# Patient Record
Sex: Female | Born: 1955 | Race: White | Hispanic: No | Marital: Married | Smoking: Former smoker
Health system: Southern US, Community
[De-identification: ages and names within clinical notes are randomized; demographics above are authoritative.]

## PROBLEM LIST (undated history)

## (undated) DIAGNOSIS — T7840XA Allergy, unspecified, initial encounter: Secondary | ICD-10-CM

## (undated) DIAGNOSIS — I639 Cerebral infarction, unspecified: Secondary | ICD-10-CM

## (undated) DIAGNOSIS — R0609 Other forms of dyspnea: Secondary | ICD-10-CM

## (undated) DIAGNOSIS — R0789 Other chest pain: Secondary | ICD-10-CM

## (undated) DIAGNOSIS — E039 Hypothyroidism, unspecified: Secondary | ICD-10-CM

## (undated) DIAGNOSIS — I219 Acute myocardial infarction, unspecified: Secondary | ICD-10-CM

## (undated) DIAGNOSIS — F32A Depression, unspecified: Secondary | ICD-10-CM

## (undated) DIAGNOSIS — Z803 Family history of malignant neoplasm of breast: Secondary | ICD-10-CM

## (undated) DIAGNOSIS — M51369 Other intervertebral disc degeneration, lumbar region without mention of lumbar back pain or lower extremity pain: Secondary | ICD-10-CM

## (undated) DIAGNOSIS — R519 Headache, unspecified: Secondary | ICD-10-CM

## (undated) DIAGNOSIS — Z1211 Encounter for screening for malignant neoplasm of colon: Secondary | ICD-10-CM

## (undated) DIAGNOSIS — F439 Reaction to severe stress, unspecified: Secondary | ICD-10-CM

## (undated) DIAGNOSIS — Z860100 Personal history of colon polyps, unspecified: Secondary | ICD-10-CM

## (undated) DIAGNOSIS — I2510 Atherosclerotic heart disease of native coronary artery without angina pectoris: Secondary | ICD-10-CM

## (undated) DIAGNOSIS — M1990 Unspecified osteoarthritis, unspecified site: Secondary | ICD-10-CM

## (undated) DIAGNOSIS — S2239XA Fracture of one rib, unspecified side, initial encounter for closed fracture: Secondary | ICD-10-CM

## (undated) DIAGNOSIS — F4310 Post-traumatic stress disorder, unspecified: Secondary | ICD-10-CM

## (undated) DIAGNOSIS — Z87891 Personal history of nicotine dependence: Secondary | ICD-10-CM

## (undated) DIAGNOSIS — I10 Essential (primary) hypertension: Secondary | ICD-10-CM

## (undated) DIAGNOSIS — R0600 Dyspnea, unspecified: Secondary | ICD-10-CM

## (undated) DIAGNOSIS — R7303 Prediabetes: Secondary | ICD-10-CM

## (undated) DIAGNOSIS — M5136 Other intervertebral disc degeneration, lumbar region: Secondary | ICD-10-CM

## (undated) DIAGNOSIS — E669 Obesity, unspecified: Secondary | ICD-10-CM

## (undated) DIAGNOSIS — O032 Embolism following incomplete spontaneous abortion: Secondary | ICD-10-CM

## (undated) DIAGNOSIS — R609 Edema, unspecified: Secondary | ICD-10-CM

## (undated) DIAGNOSIS — K219 Gastro-esophageal reflux disease without esophagitis: Secondary | ICD-10-CM

## (undated) DIAGNOSIS — G459 Transient cerebral ischemic attack, unspecified: Secondary | ICD-10-CM

## (undated) DIAGNOSIS — E785 Hyperlipidemia, unspecified: Secondary | ICD-10-CM

## (undated) HISTORY — DX: Fracture of one rib, unspecified side, initial encounter for closed fracture: S22.39XA

## (undated) HISTORY — DX: Encounter for screening for malignant neoplasm of colon: Z12.11

## (undated) HISTORY — DX: Hyperlipidemia, unspecified: E78.5

## (undated) HISTORY — DX: Allergy, unspecified, initial encounter: T78.40XA

## (undated) HISTORY — DX: Unspecified osteoarthritis, unspecified site: M19.90

## (undated) HISTORY — DX: Prediabetes: R73.03

## (undated) HISTORY — DX: Acute myocardial infarction, unspecified: I21.9

## (undated) HISTORY — DX: Personal history of colonic polyps: Z86.010

## (undated) HISTORY — DX: Personal history of nicotine dependence: Z87.891

## (undated) HISTORY — DX: Transient cerebral ischemic attack, unspecified: G45.9

## (undated) HISTORY — DX: Family history of malignant neoplasm of breast: Z80.3

## (undated) HISTORY — DX: Obesity, unspecified: E66.9

## (undated) HISTORY — DX: Personal history of colon polyps, unspecified: Z86.0100

## (undated) HISTORY — PX: MOUTH SURGERY: SHX715

## (undated) HISTORY — PX: COLONOSCOPY: SHX174

## (undated) HISTORY — DX: Atherosclerotic heart disease of native coronary artery without angina pectoris: I25.10

---

## 2005-12-02 ENCOUNTER — Ambulatory Visit: Payer: Self-pay | Admitting: Nurse Practitioner

## 2006-08-23 ENCOUNTER — Ambulatory Visit: Payer: Self-pay | Admitting: Nurse Practitioner

## 2006-09-22 ENCOUNTER — Ambulatory Visit: Payer: Self-pay | Admitting: Nurse Practitioner

## 2006-10-04 ENCOUNTER — Ambulatory Visit: Payer: Self-pay | Admitting: Nurse Practitioner

## 2007-12-10 ENCOUNTER — Ambulatory Visit: Payer: Self-pay | Admitting: Gastroenterology

## 2007-12-13 HISTORY — PX: BREAST MASS EXCISION: SHX1267

## 2007-12-13 HISTORY — PX: BREAST BIOPSY: SHX20

## 2007-12-27 ENCOUNTER — Ambulatory Visit: Payer: Self-pay | Admitting: Family Medicine

## 2008-01-02 ENCOUNTER — Ambulatory Visit: Payer: Self-pay | Admitting: Family Medicine

## 2008-01-25 ENCOUNTER — Ambulatory Visit: Payer: Self-pay | Admitting: General Surgery

## 2008-01-28 ENCOUNTER — Ambulatory Visit: Payer: Self-pay | Admitting: General Surgery

## 2008-04-11 ENCOUNTER — Encounter: Payer: Self-pay | Admitting: Cardiovascular Disease

## 2008-04-18 ENCOUNTER — Ambulatory Visit: Payer: Self-pay | Admitting: Cardiovascular Disease

## 2008-04-18 ENCOUNTER — Encounter: Payer: Self-pay | Admitting: Cardiovascular Disease

## 2008-11-04 ENCOUNTER — Encounter: Payer: Self-pay | Admitting: Cardiovascular Disease

## 2008-12-12 HISTORY — PX: CARDIAC CATHETERIZATION: SHX172

## 2008-12-15 ENCOUNTER — Ambulatory Visit: Payer: Self-pay | Admitting: General Surgery

## 2009-08-28 ENCOUNTER — Encounter: Payer: Self-pay | Admitting: Cardiovascular Disease

## 2009-12-17 ENCOUNTER — Ambulatory Visit: Payer: Self-pay | Admitting: General Surgery

## 2010-03-08 ENCOUNTER — Encounter: Payer: Self-pay | Admitting: Cardiovascular Disease

## 2010-04-16 ENCOUNTER — Encounter: Payer: Self-pay | Admitting: Cardiovascular Disease

## 2010-10-26 ENCOUNTER — Telehealth: Payer: Self-pay | Admitting: Cardiovascular Disease

## 2010-11-09 ENCOUNTER — Telehealth: Payer: Self-pay | Admitting: Cardiovascular Disease

## 2010-11-10 ENCOUNTER — Ambulatory Visit: Payer: Self-pay | Admitting: Cardiovascular Disease

## 2010-11-10 DIAGNOSIS — F4323 Adjustment disorder with mixed anxiety and depressed mood: Secondary | ICD-10-CM

## 2010-11-10 DIAGNOSIS — E785 Hyperlipidemia, unspecified: Secondary | ICD-10-CM

## 2010-12-12 HISTORY — PX: DILATION AND CURETTAGE OF UTERUS: SHX78

## 2010-12-12 HISTORY — PX: UTERINE FIBROID SURGERY: SHX826

## 2010-12-20 ENCOUNTER — Ambulatory Visit: Payer: Self-pay | Admitting: General Surgery

## 2011-01-10 ENCOUNTER — Encounter
Admission: RE | Admit: 2011-01-10 | Discharge: 2011-01-10 | Payer: Self-pay | Source: Home / Self Care | Attending: General Surgery | Admitting: General Surgery

## 2011-01-11 NOTE — Progress Notes (Signed)
  Prescriptions: BYSTOLIC 10 MG TABS (NEBIVOLOL HCL) 1 tab once daily  #30 x 6   Entered by:   Bishop Dublin, CMA   Authorized by:   Dossie Arbour MD   Signed by:   Bishop Dublin, CMA on 10/26/2010   Method used:   Electron[facility] to        [facility] [phone][facility] (retail)       [address]       [place], [place]  [address]       Ph: [phone]       Fax: [phone]   RxID:   [id number] HYDROCHLOROTHIAZIDE 12.5 MG TABS (HYDROCHLOROTHIAZIDE) Take one tablet by mouth daily.  #30 x 6   Entered by:   Bishop Dublin, CMA   Authorized by:   Dossie Arbour MD   Signed by:   Bishop Dublin, CMA on 10/26/2010   Method used:   Electronically to        [facility] [phone]* (retail)       [address]       [place], [place]  [address]       Ph: [phone]       Fax: [phone]   RxID:   [id number]

## 2011-01-11 NOTE — Letter (Signed)
Summary: Medical Record Release  Medical Record Release   Imported By: Harlon Flor 04/21/2010 15:50:16  _____________________________________________________________________  External Attachment:    Type:   Image     Comment:   External Document

## 2011-01-11 NOTE — Assessment & Plan Note (Signed)
Summary: CAD; Former patient [facility]   Visit Type:  Initial Consult Primary Provider:  [facility]  CC:  c/o chest tightness and has shortness of breath..  History of Present Illness: [name] is a 55 yo woman with PMH of morbid obesity, hypertension, hyperlipidemia, history of chest pain with cardiac catheterization May 2009  showing no significant coronary artery disease though she does have a circumflex coming off the ostium of the RCA, known to me from [facility],  who presents to establish care.  She has significant stress at home with her son who has autism and her husband who does not help her look after her son. She does no exercise, her weight has been climbing. During our evaluation, she apeared calm though reported that at times she wanted to "end it all". She wonders if her stress is normal and if she was a bad parent for wanting to escape from her home situation. She takes paxil though reports waking in the middle of the night and is unable to get back to sleep, with periods of severe anxiety.  No significant chest pain. Chronic SOB with exertion, some chest fluttering with walking.  Stress test 04/2008:   EKG shows normal sinus rhythm with rate 73 beats per minute and no significant ST or T wave changes  Preventive Screening-Counseling & Management  Alcohol-Tobacco     Smoking Status: never  Caffeine-Diet-Exercise     Does Patient Exercise: no  Current Medications (verified): 1)  Hydrochlorothiazide 12.5 Mg Tabs (Hydrochlorothiazide) .... Take One Tablet By Mouth Daily. 2)  Aspirin 325 Mg Tabs (Aspirin) .Marland Kitchen.. 1 Tab Once Daily 3)  Verapamil Hcl Cr 240 Mg Cr-Tabs (Verapamil Hcl) .Marland Kitchen.. 1 Tab Once Daily 4)  Paxil 30 Mg Tabs (Paroxetine Hcl) .... 2 Tabs Once Daily 5)  Promethazine Hcl 25 Mg Tabs (Promethazine Hcl) .Marland Kitchen.. 1 Tab Once Daily 6)  Bystolic 10 Mg Tabs (Nebivolol Hcl) .Marland Kitchen.. 1 Tab Once Daily 7)  Simvastatin 20 Mg Tabs (Simvastatin) .Marland Kitchen.. 1 Tab Once Daily 8)  Zyrtec  Allergy 10 Mg Tabs (Cetirizine Hcl) .... As Needed 9)  Omeprazole 20 Mg Cpdr (Omeprazole) .... One Tablet Once Daily 10)  Flovent Diskus 100 Mcg/blist Aepb (Fluticasone Propionate (Inhal)) .... As Needed 11)  Proair Hfa 108 (90 Base) Mcg/act Aers (Albuterol Sulfate) .... As Needed 12)  Centrum Silver  Tabs (Multiple Vitamins-Minerals) .... One Tablet Once Daily  Allergies (verified): 1)  ! Sulfa 2)  ! Benadryl  Past History:  Past Medical History: Last updated: 06/24/2010 Hyperlipidemia Hypertension Asthma Obesity  Family History: Last updated: 11/29/10 Father: Deceased age 55 cancer Mother:Deceased age 56 cancer   Social History: Last updated: 2010-11-29 Married  Tobacco Use - No.  Alcohol Use - no Regular Exercise - no  Risk Factors: Exercise: no (2010-11-29)  Risk Factors: Smoking Status: never (29-Nov-2010)  Past Surgical History: oral surgery  Family History: Father: Deceased age 57 cancer Mother:Deceased age 49 cancer   Social History: Married  Tobacco Use - No.  Alcohol Use - no Regular Exercise - no Smoking Status:  never Does Patient Exercise:  no  Review of Systems       The patient complains of dyspnea on exertion.  The patient denies fever, weight loss, weight gain, vision loss, decreased hearing, hoarseness, chest pain, syncope, peripheral edema, prolonged cough, abdominal pain, incontinence, muscle weakness, depression, and enlarged lymph nodes.         anxiety, stress  Vital Signs:  Patient profile:   55  year old female Height:      62 inches Weight:      250.50 pounds BMI:     45.98 Pulse rate:   73 / minute BP sitting:   132 / 84  (left arm) Cuff size:   large  Vitals Entered By: Bishop Dublin, CMA (November 10, 2010 3:53 PM)  Physical Exam  General:  Well developed, well nourished, in no acute distress.Obese Head:  normocephalic and atraumatic Neck:  Neck supple, no JVD. No masses, thyromegaly or abnormal cervical  nodes. Lungs:  Clear bilaterally to auscultation and percussion. Heart:  Non-displaced PMI, chest non-tender; regular rate and rhythm, S1, S2 without murmurs, rubs or gallops. Carotid upstroke normal, no bruit. Pedals normal pulses. No edema, no varicosities. Abdomen:  Bowel sounds positive; abdomen soft and non-tender without masses, morbidly obese Msk:  Back normal, normal gait. Muscle strength and tone normal. Pulses:  pulses normal in all 4 extremities Extremities:  No clubbing or cyanosis. Neurologic:  Alert and oriented x 3. Skin:  Intact without lesions or rashes. Psych:  Normal affect.   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 I have suggested that she talk with counselor and sign up with a new PMD if she does not have one. One possible option would be xanax as needed for waking in the middle of the night with severe anxiety.   Problem # 2:  HYPERTENSION, BENIGN (ICD-401.1) Blood pressure is well controlled on todays visit.  Her updated medication list for this problem includes:    Hydrochlorothiazide 12.5 Mg Tabs (Hydrochlorothiazide) .Marland Kitchen... Take one tablet by mouth daily.    Aspirin 325 Mg Tabs (Aspirin) .Marland Kitchen... 1 tab once daily    Verapamil Hcl Cr 240 Mg Cr-tabs (Verapamil hcl) .Marland Kitchen... 1 tab once daily    Bystolic 10 Mg Tabs (Nebivolol hcl) .Marland Kitchen... 1 tab once daily  Problem # 3:  HYPERLIPIDEMIA-MIXED (ICD-272.4) We will check her lipids/LFTs this week when she has been fasting.  Her updated medication list for this problem includes:    Simvastatin 20 Mg Tabs (Simvastatin) .Marland Kitchen... 1 tab once daily  Problem # 4:  OVERWEIGHT/OBESITY (ICD-278.02) We have suggested she attend weight watchers. Her husband cooks and he woulc need to be part of her weight loss plan.  Patient Instructions: 1)  Your  physician recommends that you schedule a follow-up appointment in: 6 months 2)  Your physician recommends that you return for a FASTING lipid profile: Tomorrow (Lipid/LFT) 3)  Your physician recommends that you continue on your current medications as directed. Please refer to the Current Medication list given to you today.

## 2011-01-11 NOTE — Progress Notes (Signed)
Summary: RX  Phone Note Refill Request Call back at Home Phone [phone] Message from:  Patient on November 09, 2010 2:06 PM  Refills Requested: Medication #1:  VERAPAMIL HCL CR 240 MG CR-TABS 1 tab once [facility] [place]Please call pt once this has been called in  Initial call taken by: Harlon Flor,  November 09, 2010 2:07 PM  Follow-up for Phone Call        Rx called to pharmacy Follow-up by: Bishop Dublin, CMA,  November 09, 2010 3:05 PM    Prescriptions: VERAPAMIL HCL CR 240 MG CR-TABS (VERAPAMIL HCL) 1 tab once daily  #30 x 3   Entered by:   Bishop Dublin, CMA   Authorized by:   Dossie Arbour MD   Signed by:   Bishop Dublin, CMA on 11/09/2010   Method used:   Electronically to        [facility] [phone][facility] (retail)       [address]       [place], [place]  [address]       Ph: [phone]       Fax: [phone]   RxID:   [id number]

## 2011-01-11 NOTE — Miscellaneous (Signed)
Summary: rx: HCTZ  Clinical Lists Changes  Medications: Added new medication of HYDROCHLOROTHIAZIDE 12.5 MG TABS (HYDROCHLOROTHIAZIDE) Take one tablet by mouth daily.    [facility]  ok x6  03/05/10 [place] :)

## 2011-01-13 NOTE — Letter (Signed)
Summary: [facility] Office Note   [facility] Office Note   Imported By: Roderic Ovens 12/16/2010 15:46:57  _____________________________________________________________________  External Attachment:    Type:   Image     Comment:   External Document

## 2011-01-13 NOTE — Cardiovascular Report (Signed)
Summary: [facility]   [facility]   Imported By: Roderic Ovens 01/07/2011 13:16:32  _____________________________________________________________________  External Attachment:    Type:   Image     Comment:   External Document

## 2011-01-13 NOTE — Letter (Signed)
Summary: [facility] Office Note   [facility] Office Note   Imported By: Roderic Ovens 12/16/2010 15:47:20  _____________________________________________________________________  External Attachment:    Type:   Image     Comment:   External Document

## 2011-01-24 ENCOUNTER — Ambulatory Visit: Payer: Self-pay

## 2011-01-24 DIAGNOSIS — I10 Essential (primary) hypertension: Secondary | ICD-10-CM

## 2011-01-31 ENCOUNTER — Telehealth: Payer: Self-pay | Admitting: Cardiovascular Disease

## 2011-02-03 ENCOUNTER — Ambulatory Visit: Payer: Self-pay

## 2011-02-08 NOTE — Progress Notes (Signed)
Summary: FYI  Phone Note Call from Patient Call back at [facility] Phone [phone]   Caller: Self Call For: Chasady Longwell Summary of Call: Pt is having a DNC histoscopy on Thursday.  Pt would like a return call from [place]. Initial call taken by: Harlon Flor,  January 31, 2011 8:25 AM  Follow-up for Phone Call        Pt is having D&C at OB-GYN this Thursday at Dr. Merrilee Seashore office. Pt thought she needed clearance, but was mistaken. Pt also just confirming she could stop ASA. Pt has not had any stent placed, cath in 2009. Told pt she could stop ASA and will notify Dr. Mariah Milling of this procedure, but that she should be fine to have procedure. Follow-up by: Lanny Hurst RN,  February 01, 2011 12:28 PM

## 2011-03-07 ENCOUNTER — Telehealth: Payer: Self-pay

## 2011-03-07 MED ORDER — VERAPAMIL HCL ER 240 MG PO TBCR: 240.0000 mg | EXTENDED_RELEASE_TABLET | Freq: Every day | ORAL | Status: DC

## 2011-03-07 NOTE — Telephone Encounter (Signed)
Needs a refill on verapamil ER 240 mg take one tablet every day.

## 2011-05-19 ENCOUNTER — Other Ambulatory Visit: Payer: Self-pay

## 2011-05-19 MED ORDER — SIMVASTATIN 20 MG PO TABS: 20.0000 mg | ORAL_TABLET | Freq: Every evening | ORAL | Status: DC

## 2011-05-19 NOTE — Telephone Encounter (Signed)
Needs a refill for simvastatin 20 mg sent to [facility] pharmacy.

## 2011-05-24 ENCOUNTER — Other Ambulatory Visit: Payer: Self-pay | Admitting: Emergency Medicine

## 2011-06-06 ENCOUNTER — Other Ambulatory Visit: Payer: Self-pay | Admitting: Emergency Medicine

## 2011-06-06 MED ORDER — HYDROCHLOROTHIAZIDE 12.5 MG PO CAPS: 12.5000 mg | ORAL_CAPSULE | Freq: Every day | ORAL | Status: DC

## 2011-06-10 ENCOUNTER — Telehealth: Payer: Self-pay

## 2011-06-10 MED ORDER — SIMVASTATIN 20 MG PO TABS: 20.0000 mg | ORAL_TABLET | Freq: Every evening | ORAL | Status: DC

## 2011-06-10 NOTE — Telephone Encounter (Signed)
Needs a refill sent for simvastatin 20 mg take one tablet at bedtime [facility]

## 2011-06-16 ENCOUNTER — Other Ambulatory Visit: Payer: Self-pay | Admitting: *Deleted

## 2011-06-16 MED ORDER — NEBIVOLOL HCL 10 MG PO TABS: 10.0000 mg | ORAL_TABLET | Freq: Every day | ORAL | Status: DC

## 2011-06-16 NOTE — Telephone Encounter (Signed)
rx sent in rx today bystolic

## 2011-07-05 ENCOUNTER — Encounter: Payer: Self-pay | Admitting: Cardiovascular Disease

## 2011-07-25 ENCOUNTER — Telehealth: Payer: Self-pay

## 2011-07-25 MED ORDER — HYDROCHLOROTHIAZIDE 12.5 MG PO CAPS: 12.5000 mg | ORAL_CAPSULE | Freq: Every day | ORAL | Status: DC

## 2011-07-25 MED ORDER — NEBIVOLOL HCL 10 MG PO TABS: 10.0000 mg | ORAL_TABLET | Freq: Every day | ORAL | Status: DC

## 2011-07-25 MED ORDER — VERAPAMIL HCL ER 240 MG PO TBCR: 240.0000 mg | EXTENDED_RELEASE_TABLET | Freq: Every day | ORAL | Status: DC

## 2011-07-25 MED ORDER — SIMVASTATIN 20 MG PO TABS: 20.0000 mg | ORAL_TABLET | Freq: Every evening | ORAL | Status: DC

## 2011-07-25 NOTE — Telephone Encounter (Signed)
Requested refills for simvastatin, HCTZ, Bystolic and verapamil.

## 2011-10-14 ENCOUNTER — Telehealth: Payer: Self-pay

## 2011-10-14 MED ORDER — NEBIVOLOL HCL 10 MG PO TABS: 10.0000 mg | ORAL_TABLET | Freq: Every day | ORAL | Status: DC

## 2011-10-14 NOTE — Telephone Encounter (Signed)
Refill sent for bystolic 10 mg take one tablet daily.

## 2011-11-11 ENCOUNTER — Telehealth: Payer: Self-pay | Admitting: Cardiovascular Disease

## 2011-11-11 DIAGNOSIS — R0602 Shortness of breath: Secondary | ICD-10-CM

## 2011-11-11 DIAGNOSIS — R609 Edema, unspecified: Secondary | ICD-10-CM

## 2011-11-11 NOTE — Telephone Encounter (Signed)
Pt c/o "not feeling normal;" over last month incr in fatigue, heaviness in chest, swelling (all over, measures by wedding band tighter). DOE, but this is normal for pt. She does take hctz 12.5mg  daily, she has recently incr to 25mg  daily on her own, with noticed incr in UOP. BP today is 120/66 HR 72.  Pt last ov 10/2010, scheduled next thur 12/7 for 1 yr f/u. H/O morbid obesity, CP with repeat cath '09 showing now significant CAD, incr stress at home per last note, chronic DOE.   I told pt to come in day before appt  for Bmet and BNP, and to call back if sx worsen. Do you want pt to try higher dose of hctz in meantime? Please advise.

## 2011-11-11 NOTE — Telephone Encounter (Signed)
Pt having increased swelling and chest tightness. Wants to talk to a nurse.

## 2011-11-13 NOTE — Telephone Encounter (Signed)
i suspect secondary to weight gain. Decrease po salt, fluids. Stay on hctz 25 daily

## 2011-11-15 ENCOUNTER — Encounter: Payer: Self-pay | Admitting: Cardiovascular Disease

## 2011-11-15 NOTE — Telephone Encounter (Signed)
Pt notified, she will f/u as scheduled.

## 2011-11-17 ENCOUNTER — Ambulatory Visit (INDEPENDENT_AMBULATORY_CARE_PROVIDER_SITE_OTHER): Payer: Commercial Indemnity | Admitting: *Deleted

## 2011-11-17 DIAGNOSIS — R609 Edema, unspecified: Secondary | ICD-10-CM

## 2011-11-17 DIAGNOSIS — R0602 Shortness of breath: Secondary | ICD-10-CM

## 2011-11-18 ENCOUNTER — Ambulatory Visit (INDEPENDENT_AMBULATORY_CARE_PROVIDER_SITE_OTHER): Payer: Commercial Indemnity | Admitting: Cardiovascular Disease

## 2011-11-18 ENCOUNTER — Telehealth: Payer: Self-pay

## 2011-11-18 ENCOUNTER — Encounter: Payer: Self-pay | Admitting: Cardiovascular Disease

## 2011-11-18 DIAGNOSIS — I10 Essential (primary) hypertension: Secondary | ICD-10-CM

## 2011-11-18 DIAGNOSIS — F4323 Adjustment disorder with mixed anxiety and depressed mood: Secondary | ICD-10-CM

## 2011-11-18 DIAGNOSIS — R609 Edema, unspecified: Secondary | ICD-10-CM

## 2011-11-18 DIAGNOSIS — E785 Hyperlipidemia, unspecified: Secondary | ICD-10-CM

## 2011-11-18 DIAGNOSIS — E663 Overweight: Secondary | ICD-10-CM

## 2011-11-18 DIAGNOSIS — R079 Chest pain, unspecified: Secondary | ICD-10-CM

## 2011-11-18 DIAGNOSIS — R0789 Other chest pain: Secondary | ICD-10-CM | POA: Insufficient documentation

## 2011-11-18 MED ORDER — SIMVASTATIN 20 MG PO TABS: 20.0000 mg | ORAL_TABLET | Freq: Every evening | ORAL | Status: DC

## 2011-11-18 MED ORDER — POTASSIUM CHLORIDE ER 10 MEQ PO TBCR: 10.0000 meq | EXTENDED_RELEASE_TABLET | Freq: Two times a day (BID) | ORAL | Status: DC

## 2011-11-18 MED ORDER — HYDROCHLOROTHIAZIDE 25 MG PO TABS: 25.0000 mg | ORAL_TABLET | Freq: Every day | ORAL | Status: DC

## 2011-11-18 MED ORDER — VERAPAMIL HCL ER 240 MG PO TBCR: 240.0000 mg | EXTENDED_RELEASE_TABLET | Freq: Every day | ORAL | Status: DC

## 2011-11-18 MED ORDER — FUROSEMIDE 20 MG PO TABS: 20.0000 mg | ORAL_TABLET | Freq: Every day | ORAL | Status: DC | PRN

## 2011-11-18 MED ORDER — NEBIVOLOL HCL 10 MG PO TABS: 10.0000 mg | ORAL_TABLET | Freq: Every day | ORAL | Status: DC

## 2011-11-18 NOTE — Assessment & Plan Note (Signed)
Cholesterol is at goal on the current lipid regimen. No changes to the medications were made.  

## 2011-11-18 NOTE — Assessment & Plan Note (Signed)
Blood pressure is well controlled on today's visit. No changes made to the medications. 

## 2011-11-18 NOTE — Assessment & Plan Note (Signed)
Chest pain is likely noncardiac given clean coronary arteries by catheterization 3 years ago. She is reluctant to take nitroglycerin secondary to history of severe migraines

## 2011-11-18 NOTE — Patient Instructions (Addendum)
You are doing well. We will start lasix as needed for edema or swelling or chest tightness, with potassium.  We have ordered an echocardiogram to look for reasons for edema, murmur and chest pain  Please call us if you have new issues that need to be addressed before your next appt.  The office will contact you for a follow up Appt. In 6 months

## 2011-11-18 NOTE — Telephone Encounter (Signed)
Refill sent for calan, simvastatin, bystolic, hydrochlorothiazide.

## 2011-11-18 NOTE — Telephone Encounter (Signed)
Refill sent for calan

## 2011-11-18 NOTE — Assessment & Plan Note (Signed)
I suspect that underlying anxiety could be playing a role in her symptoms

## 2011-11-18 NOTE — Progress Notes (Signed)
Patient ID: Elizabeth Wyatt, female    DOB: [birth year], 55 y.o.   MRN: [id number]  HPI Comments: Elizabeth Wyatt is a 55 yo woman with PMH of morbid obesity, hypertension, hyperlipidemia, history of chest pain with cardiac catheterization May 2009  showing no significant coronary artery disease though she does have a circumflex coming off the ostium of the RCA, Who presents for routine followup.  She has numerous complaints and reports having edema, abdominal swelling, swollen fingers and fluid retention. She has had recent episodes of chest pain over the past week while at rest. She describes this as a squeezing in her chest, also as a pressure. She is under the understanding that she has had a heart attack in the past and we have tried to correct her that she has no underlying coronary artery disease by catheterization 3 years ago.  She does eat out a lot, her weight continues to get worse.   She has significant stress at home with her son who has autism. She does no exercise.  periods of severe anxiety.   Stress test 04/2008:  Cardiac catheter at the same time   EKG shows normal sinus rhythm with rate 63 beats per minute and no significant ST or T wave changes    Outpatient Encounter Prescriptions as of 11/18/2011  Medication Sig Dispense Refill  . albuterol (PROAIR HFA) 108 (90 BASE) MCG/ACT inhaler Inhale 2 puffs into the lungs every 6 (six) hours as needed.        Marland Kitchen aspirin 325 MG tablet Take 325 mg by mouth daily.        . cetirizine (ZYRTEC) 10 MG tablet Take 10 mg by mouth as needed.        . DULoxetine (CYMBALTA) 30 MG capsule Take 30 mg by mouth daily.        . Fluticasone Propionate, Inhal, (FLOVENT DISKUS) 100 MCG/BLIST AEPB Inhale into the lungs as needed.        Marland Kitchen omeprazole (PRILOSEC) 20 MG capsule Take 20 mg by mouth daily.        Marland Kitchen topiramate (TOPAMAX) 25 MG capsule Take 25 mg by mouth daily.        .  hydrochlorothiazide (MICROZIDE) 12.5 MG capsule Take two tablets  every am       .  nebivolol (BYSTOLIC) 10 MG tablet Take 1 tablet (10 mg total) by mouth daily.  30 tablet  3  .  simvastatin (ZOCOR) 20 MG tablet Take 1 tablet (20 mg total) by mouth every evening.  30 tablet  6  .  verapamil (CALAN-SR) 240 MG CR tablet Take 1 tablet (240 mg total) by mouth at bedtime.  30 tablet  6     Review of Systems  Constitutional: Negative.   HENT: Negative.   Eyes: Negative.   Respiratory: Negative.   Cardiovascular: Positive for chest pain and leg swelling.  Gastrointestinal: Positive for abdominal distention.  Musculoskeletal: Negative.   Skin: Negative.   Neurological: Negative.   Hematological: Negative.   Psychiatric/Behavioral: Negative.   All other systems reviewed and are negative.    BP 120/72  Pulse 63  Ht 5\' 3"  (1.6 m)  Wt 255 lb 8 oz (115.894 kg)  BMI 45.26 kg/m2  Physical Exam  Nursing note and vitals reviewed. Constitutional: She is oriented to person, place, and time. She appears well-developed and well-nourished.        obese  HENT:  Head: Normocephalic.  Nose: Nose normal.  Mouth/Throat: Oropharynx  is clear and moist.  Eyes: Conjunctivae are normal. Pupils are equal, round, and reactive to light.  Neck: Normal range of motion. Neck supple. No JVD present.  Cardiovascular: Normal rate, regular rhythm, S1 normal, S2 normal and intact distal pulses.  Exam reveals no gallop and no friction rub.   Murmur heard. Pulmonary/Chest: Effort normal and breath sounds normal. No respiratory distress. She has no wheezes. She has no rales. She exhibits no tenderness.  Abdominal: Soft. Bowel sounds are normal. She exhibits no distension. There is no tenderness.  Musculoskeletal: Normal range of motion. She exhibits edema. She exhibits no tenderness.  Lymphadenopathy:    She has no cervical adenopathy.  Neurological: She is alert and oriented to person, place, and time. Coordination normal.  Skin: Skin is warm and dry. No rash noted. No  erythema.  Psychiatric: She has a normal mood and affect. Her behavior is normal. Judgment and thought content normal.         Assessment and Plan

## 2011-11-18 NOTE — Assessment & Plan Note (Signed)
Etiology of her edema is likely secondary to severe obesity, possible stalk dysfunction, high salt intake, significant p.o. Fluid intake. We have asked her to cut back on her fluids and salt and will start her on Lasix 20 mg p.r.n. With potassium

## 2011-11-18 NOTE — Assessment & Plan Note (Signed)
She continues to struggle with her weight. We have encouraged continued exercise, careful diet management in an effort to lose weight.

## 2011-11-25 ENCOUNTER — Other Ambulatory Visit (INDEPENDENT_AMBULATORY_CARE_PROVIDER_SITE_OTHER): Payer: Commercial Indemnity | Admitting: *Deleted

## 2011-11-25 DIAGNOSIS — R079 Chest pain, unspecified: Secondary | ICD-10-CM

## 2011-11-25 DIAGNOSIS — R609 Edema, unspecified: Secondary | ICD-10-CM

## 2011-11-28 ENCOUNTER — Telehealth: Payer: Self-pay | Admitting: *Deleted

## 2011-11-28 NOTE — Telephone Encounter (Signed)
Pt notified echo results were normal per Dr. Mariah Milling. Non-cardiac cause of her chest pain, and if recurrent chest pain advised to f/u with pcp, otherwise f/u here PRN.

## 2011-12-01 LAB — BASIC METABOLIC PANEL
BUN/Creatinine Ratio: 15 (ref 9–23)
BUN: 13 mg/dL (ref 6–24)
CO2: 23 mmol/L (ref 20–32)
Creatinine, Ser: 0.87 mg/dL (ref 0.57–1.00)
GFR calc Af Amer: 87 mL/min/{1.73_m2} (ref >59)
GFR calc non Af Amer: 75 mL/min/{1.73_m2} (ref >59)

## 2011-12-28 ENCOUNTER — Ambulatory Visit: Payer: Self-pay | Admitting: General Surgery

## 2012-01-16 ENCOUNTER — Other Ambulatory Visit: Payer: Self-pay | Admitting: *Deleted

## 2012-01-16 MED ORDER — SIMVASTATIN 20 MG PO TABS: 20.0000 mg | ORAL_TABLET | Freq: Every evening | ORAL | Status: DC

## 2012-02-11 ENCOUNTER — Emergency Department: Payer: Self-pay | Admitting: Emergency Medicine

## 2012-03-18 ENCOUNTER — Other Ambulatory Visit: Payer: Self-pay | Admitting: Cardiovascular Disease

## 2012-03-26 ENCOUNTER — Other Ambulatory Visit: Payer: Self-pay | Admitting: *Deleted

## 2012-03-26 MED ORDER — HYDROCHLOROTHIAZIDE 25 MG PO TABS: 25.0000 mg | ORAL_TABLET | Freq: Every day | ORAL | Status: DC

## 2012-04-02 DIAGNOSIS — F419 Anxiety disorder, unspecified: Secondary | ICD-10-CM | POA: Insufficient documentation

## 2012-04-16 ENCOUNTER — Other Ambulatory Visit: Payer: Self-pay | Admitting: *Deleted

## 2012-04-16 MED ORDER — SIMVASTATIN 20 MG PO TABS: 20.0000 mg | ORAL_TABLET | Freq: Every evening | ORAL | Status: DC

## 2012-04-16 NOTE — Telephone Encounter (Signed)
Refilled Simvastatin. 

## 2012-06-09 ENCOUNTER — Emergency Department: Payer: Self-pay | Admitting: Emergency Medicine

## 2012-06-29 ENCOUNTER — Other Ambulatory Visit: Payer: Self-pay | Admitting: Cardiovascular Disease

## 2012-06-29 NOTE — Telephone Encounter (Signed)
LMTCB to set up appointment and refilled Verapamil until she is able to be seen.

## 2012-07-18 ENCOUNTER — Other Ambulatory Visit: Payer: Self-pay | Admitting: Cardiovascular Disease

## 2012-07-18 NOTE — Telephone Encounter (Signed)
Refilled Bystolic

## 2012-07-20 ENCOUNTER — Other Ambulatory Visit: Payer: Self-pay

## 2012-07-20 MED ORDER — SIMVASTATIN 20 MG PO TABS: 20.0000 mg | ORAL_TABLET | Freq: Every evening | ORAL | Status: DC

## 2012-07-20 NOTE — Telephone Encounter (Signed)
Refill sent for simvastatin 20 mg take one tablet daily at bedtime.

## 2012-07-30 ENCOUNTER — Ambulatory Visit (INDEPENDENT_AMBULATORY_CARE_PROVIDER_SITE_OTHER): Payer: Commercial Indemnity | Admitting: Cardiovascular Disease

## 2012-07-30 ENCOUNTER — Encounter: Payer: Self-pay | Admitting: Cardiovascular Disease

## 2012-07-30 ENCOUNTER — Ambulatory Visit: Payer: Commercial Indemnity | Admitting: Cardiovascular Disease

## 2012-07-30 VITALS — BP 112/72 | HR 62 | Ht 63.0 in | Wt 236.0 lb

## 2012-07-30 DIAGNOSIS — F4323 Adjustment disorder with mixed anxiety and depressed mood: Secondary | ICD-10-CM

## 2012-07-30 DIAGNOSIS — E663 Overweight: Secondary | ICD-10-CM

## 2012-07-30 DIAGNOSIS — I10 Essential (primary) hypertension: Secondary | ICD-10-CM

## 2012-07-30 DIAGNOSIS — R609 Edema, unspecified: Secondary | ICD-10-CM

## 2012-07-30 NOTE — Patient Instructions (Addendum)
You are doing well. No medication changes were made.  Please call us if you have new issues that need to be addressed before your next appt.  Your physician wants you to follow-up in: 12 months.  You will receive a reminder letter in the mail two months in advance. If you don't receive a letter, please call our office to schedule the follow-up appointment. 

## 2012-07-30 NOTE — Assessment & Plan Note (Signed)
Blood pressure is well controlled on today's visit. No changes made to the medications. 

## 2012-07-30 NOTE — Assessment & Plan Note (Signed)
Weight is down 20 pounds from her last clinic visit. She is watching her portions.

## 2012-07-30 NOTE — Assessment & Plan Note (Signed)
Her biggest issue appears to be her stress at home.

## 2012-07-30 NOTE — Assessment & Plan Note (Signed)
Edema has resolved. She is not taking a diuretic.
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
## 2012-10-30 ENCOUNTER — Other Ambulatory Visit: Payer: Self-pay | Admitting: Cardiovascular Disease

## 2012-10-30 NOTE — Telephone Encounter (Signed)
Refilled Verapamil.

## 2012-11-18 ENCOUNTER — Other Ambulatory Visit: Payer: Self-pay | Admitting: Cardiovascular Disease

## 2012-11-19 ENCOUNTER — Other Ambulatory Visit: Payer: Self-pay

## 2012-11-19 MED ORDER — NEBIVOLOL HCL 10 MG PO TABS: 10.0000 mg | ORAL_TABLET | Freq: Every day | ORAL | Status: DC

## 2012-11-19 NOTE — Telephone Encounter (Signed)
Refill sent for bystolic 

## 2013-01-14 ENCOUNTER — Ambulatory Visit: Payer: Self-pay | Admitting: General Surgery

## 2013-02-21 ENCOUNTER — Other Ambulatory Visit: Payer: Self-pay | Admitting: Cardiovascular Disease

## 2013-02-21 NOTE — Telephone Encounter (Signed)
Refilled Bystolic #30 Refill Refill#6 and Verapamil #30 [facility]

## 2013-03-01 ENCOUNTER — Other Ambulatory Visit: Payer: Self-pay | Admitting: General Surgery

## 2013-03-01 DIAGNOSIS — Z86010 Personal history of colonic polyps: Secondary | ICD-10-CM

## 2013-03-06 ENCOUNTER — Ambulatory Visit: Payer: Self-pay | Admitting: General Surgery

## 2013-03-06 DIAGNOSIS — K5730 Diverticulosis of large intestine without perforation or abscess without bleeding: Secondary | ICD-10-CM

## 2013-03-06 DIAGNOSIS — Z860100 Personal history of colon polyps, unspecified: Secondary | ICD-10-CM

## 2013-03-07 ENCOUNTER — Other Ambulatory Visit: Payer: Self-pay | Admitting: Cardiovascular Disease

## 2013-03-07 ENCOUNTER — Telehealth: Payer: Self-pay | Admitting: *Deleted

## 2013-03-07 NOTE — Telephone Encounter (Signed)
Pt aware.

## 2013-03-07 NOTE — Telephone Encounter (Signed)
Pt called this morning states she had a colonoscopy yesterday by Dr. Evette Cristal.  States she has had 5 loose-mushy BM bright red blood and a clot last night.  No BM for today yet.  I told her I would let Dr Evette Cristal know if he has any suggestions.

## 2013-03-07 NOTE — Telephone Encounter (Signed)
Monitor her symptoms today, call if it persists.

## 2013-03-11 ENCOUNTER — Encounter: Payer: Self-pay | Admitting: General Surgery

## 2013-03-20 ENCOUNTER — Other Ambulatory Visit: Payer: Self-pay | Admitting: Cardiovascular Disease

## 2013-03-20 NOTE — Telephone Encounter (Signed)
Refilled Simvastatin sent to [facility] pharmacy. 

## 2013-03-22 DIAGNOSIS — F32A Depression, unspecified: Secondary | ICD-10-CM | POA: Insufficient documentation

## 2013-03-22 DIAGNOSIS — F329 Major depressive disorder, single episode, unspecified: Secondary | ICD-10-CM | POA: Insufficient documentation

## 2013-04-11 ENCOUNTER — Other Ambulatory Visit: Payer: Self-pay | Admitting: Cardiovascular Disease

## 2013-04-11 NOTE — Telephone Encounter (Signed)
Pt is taking is taking Verapamil 240 mg and is on Simvastatin 20 mg pharmacy is questioning if ok to refill?

## 2013-04-22 DIAGNOSIS — J309 Allergic rhinitis, unspecified: Secondary | ICD-10-CM | POA: Insufficient documentation

## 2013-04-22 DIAGNOSIS — J45909 Unspecified asthma, uncomplicated: Secondary | ICD-10-CM | POA: Insufficient documentation

## 2013-04-27 ENCOUNTER — Emergency Department: Payer: Self-pay | Admitting: Emergency Medicine

## 2013-06-17 ENCOUNTER — Encounter: Payer: Self-pay | Admitting: *Deleted

## 2013-06-17 DIAGNOSIS — Z860100 Personal history of colon polyps, unspecified: Secondary | ICD-10-CM | POA: Insufficient documentation

## 2013-06-17 DIAGNOSIS — G459 Transient cerebral ischemic attack, unspecified: Secondary | ICD-10-CM | POA: Insufficient documentation

## 2013-08-11 ENCOUNTER — Emergency Department: Payer: Self-pay | Admitting: Emergency Medicine

## 2013-08-23 ENCOUNTER — Encounter: Payer: Self-pay | Admitting: Internal Medicine

## 2013-08-23 ENCOUNTER — Ambulatory Visit (INDEPENDENT_AMBULATORY_CARE_PROVIDER_SITE_OTHER): Payer: Commercial Indemnity | Admitting: Internal Medicine

## 2013-08-23 VITALS — BP 110/70 | HR 73 | Temp 98.2°F | Ht 61.0 in | Wt 241.5 lb

## 2013-08-23 DIAGNOSIS — E785 Hyperlipidemia, unspecified: Secondary | ICD-10-CM

## 2013-08-23 DIAGNOSIS — Z860100 Personal history of colon polyps, unspecified: Secondary | ICD-10-CM

## 2013-08-23 DIAGNOSIS — E663 Overweight: Secondary | ICD-10-CM

## 2013-08-23 DIAGNOSIS — Z23 Encounter for immunization: Secondary | ICD-10-CM

## 2013-08-23 DIAGNOSIS — R609 Edema, unspecified: Secondary | ICD-10-CM

## 2013-08-23 DIAGNOSIS — F4323 Adjustment disorder with mixed anxiety and depressed mood: Secondary | ICD-10-CM

## 2013-08-23 DIAGNOSIS — I10 Essential (primary) hypertension: Secondary | ICD-10-CM

## 2013-08-23 DIAGNOSIS — G459 Transient cerebral ischemic attack, unspecified: Secondary | ICD-10-CM

## 2013-08-26 ENCOUNTER — Encounter: Payer: Self-pay | Admitting: Internal Medicine

## 2013-08-26 NOTE — Assessment & Plan Note (Signed)
Monitor salt intake.  Follow.  Not a significant issue today.

## 2013-08-26 NOTE — Assessment & Plan Note (Signed)
Blood pressure is doing well on her current regimen.  Follow.  Follow metabolic panel.   

## 2013-08-26 NOTE — Assessment & Plan Note (Signed)
Low cholesterol diet and exercise.  On simvastatin.  Follow lipid profile and liver function.

## 2013-08-26 NOTE — Assessment & Plan Note (Signed)
She has lost some weight previously.  States her max weight was 256 pounds.  Discussed diet and exercise.

## 2013-08-26 NOTE — Assessment & Plan Note (Signed)
Obtain records to review last colonoscopy. Schedule follow up colonoscopy when due.

## 2013-08-26 NOTE — Progress Notes (Signed)
Subjective:    Patient ID: Elizabeth Wyatt, female    DOB: 1956/11/21, 57 y.o.   MRN: [id number]  HPI 57 year old female with past history of CAD (followed by Dr Mariah Milling), CVA (followed by Dr Sherryll Burger), hypertension and  Hypercholesterolemia.  She comes in today to follow up on these issues as well as to establish care.  Former pt of Dr Veneda Melter.  She refused to complete the new pt paperwork - stating that everything should be in the records.  States she had a CVA in 2001.  Sees Dr Sherryll Burger.  Is on aspirin daily.  He also has her on Topamax.  Denies problems with headache or dizziness.  Has known CAD.  Sees Dr Mariah Milling.  Currently stable.  Denies any chest pain or tightness.  Breathing stable.  She has bee followed at [facility] by Dr Luella Cook.  States has had two episodes of post menopausal bleeding.  Was worked up.  States surgery was discussed.  Need to obtain records.  She denies any abdominal pain or cramping.  Bowels stable.  Two weeks ago, she was leaning over a chair and heard a snap.  Increased pain.  To ER.  Had rib fracture.  Pain has improved some, but she still reports increased pain.  Able to take a good breath.  No sob.  Describes increased stress with her family situation.  Husband is back living with her and her son.  Her son has autism.  Is apparently highly functioning.  Feels she is handling things relatively well.     Past Medical History  Diagnosis Date  . HLD (hyperlipidemia)   . HTN (hypertension)   . Asthma   . Obesity   . Allergy   . Personal history of tobacco use, presenting hazards to health   . Special screening for malignant neoplasms, colon   . TIA (transient ischemic attack)   . CAD (coronary artery disease)   . Personal history of colonic polyps   . Family history of malignant neoplasm of breast     Current Outpatient Prescriptions on File Prior to Visit  Medication Sig Dispense Refill  . albuterol (PROAIR HFA) 108 (90 BASE) MCG/ACT inhaler Inhale 2  puffs into the lungs every 6 (six) hours as needed.        Marland Kitchen aspirin 81 MG tablet Take 81 mg by mouth daily.      Marland Kitchen BYSTOLIC 10 MG tablet TAKE 1 TABLET (10 MG TOTAL) BY MOUTH DAILY.  30 tablet  3  . cetirizine (ZYRTEC) 10 MG tablet Take 10 mg by mouth as needed.        . DULoxetine (CYMBALTA) 30 MG capsule Take 30 mg by mouth daily.        . Fluticasone Propionate, Inhal, (FLOVENT DISKUS) 100 MCG/BLIST AEPB Inhale into the lungs as needed.        Marland Kitchen omeprazole (PRILOSEC) 20 MG capsule Take 20 mg by mouth daily.        Marland Kitchen topiramate (TOPAMAX) 25 MG capsule Take 25 mg by mouth daily.        . verapamil (CALAN-SR) 240 MG CR tablet TAKE 1 TABLET (240 MG TOTAL) BY MOUTH AT BEDTIME.  30 tablet  3  . nitroGLYCERIN (NITROSTAT) 0.4 MG SL tablet Place 0.4 mg under the tongue every 5 (five) minutes as needed.      . potassium chloride (K-DUR) 10 MEQ tablet Take 10 mEq by mouth daily as needed.  No current facility-administered medications on file prior to visit.    Review of Systems Patient denies any headache, lightheadedness or dizziness.  No sinus or allergy symptoms.  No chest pain, tightness or palpitations.  No increased shortness of breath, cough or congestion.  No nausea or vomiting.  No acid reflux.  No abdominal pain or cramping.  No bowel change, such as diarrhea, constipation, BRBPR or melana.  No urine change.   No vaginal bleeding currently.  Need gyn's records to review.  Apparently has had some post menopausal bleeding previously.  Unclear of w/up done.  Increased rib pain s/p fracture.  Gradually improving.       Objective:   Physical Exam Filed Vitals:   08/23/13 1430  BP: 110/70  Pulse: 73  Temp: 98.2 F (14.33 C)   57 year old female in no acute distress.   HEENT:  Nares- clear.  Oropharynx - without lesions. NECK:  Supple.  Nontender.  No audible bruit.  HEART:  Appears to be regular. LUNGS:  No crackles or wheezing audible.  Respirations even and unlabored.  Good breath  sounds bilaterally.   RADIAL PULSE:  Equal bilaterally.    RIBS:  Increased pain to palpation over the left posterior lateral ribs.  ABDOMEN:  Soft, nontender.  Bowel sounds present and normal.  No audible abdominal bruit.   EXTREMITIES:  No increased edema present.  DP pulses palpable and equal bilaterally.      SKIN:  No lesions or bruising - over the ribs.        Assessment & Plan:  RIB FRACTURE.  S/p rib fracture.  Pain has improved.  Follow.    HEALTH MAINTENANCE.  Obtain records for review.   Will need to keep her on track with her physicals, mammograms and colonoscopy.  Apparently is followed by Dr Evette Cristal for her mammograms.    I spent 45 minutes with the patient and more than 50% of the time was spent in consultation regarding the above.

## 2013-08-26 NOTE — Assessment & Plan Note (Signed)
Discussed at length with her today.  Increased stress with her home situation.  Will give her the name of a good counselor in town.  She is currently on cymbalta.  Follow.

## 2013-08-26 NOTE — Assessment & Plan Note (Signed)
States occurred in 2001.  Sees Dr Sherryll Burger.  Currently stable.  On aspirin daily.

## 2013-09-10 ENCOUNTER — Encounter: Payer: Self-pay | Admitting: Internal Medicine

## 2013-10-16 ENCOUNTER — Other Ambulatory Visit: Payer: Self-pay | Admitting: Cardiovascular Disease

## 2013-10-24 ENCOUNTER — Ambulatory Visit (INDEPENDENT_AMBULATORY_CARE_PROVIDER_SITE_OTHER): Payer: Commercial Indemnity | Admitting: Internal Medicine

## 2013-10-24 ENCOUNTER — Encounter (INDEPENDENT_AMBULATORY_CARE_PROVIDER_SITE_OTHER): Payer: Self-pay

## 2013-10-24 ENCOUNTER — Encounter: Payer: Self-pay | Admitting: Internal Medicine

## 2013-10-24 VITALS — BP 110/70 | HR 67 | Temp 98.1°F | Ht 61.0 in | Wt 249.5 lb

## 2013-10-24 DIAGNOSIS — F4323 Adjustment disorder with mixed anxiety and depressed mood: Secondary | ICD-10-CM

## 2013-10-24 DIAGNOSIS — Z860100 Personal history of colon polyps, unspecified: Secondary | ICD-10-CM

## 2013-10-24 DIAGNOSIS — E785 Hyperlipidemia, unspecified: Secondary | ICD-10-CM

## 2013-10-24 DIAGNOSIS — I10 Essential (primary) hypertension: Secondary | ICD-10-CM

## 2013-10-24 DIAGNOSIS — N950 Postmenopausal bleeding: Secondary | ICD-10-CM

## 2013-10-24 DIAGNOSIS — G459 Transient cerebral ischemic attack, unspecified: Secondary | ICD-10-CM

## 2013-10-24 LAB — COMPREHENSIVE METABOLIC PANEL
ALT: 15 U/L (ref 0–35)
AST: 16 U/L (ref 0–37)
Albumin: 3.7 g/dL (ref 3.5–5.2)
BUN: 23 mg/dL (ref 6–23)
CO2: 26 mEq/L (ref 19–32)
Calcium: 9.3 mg/dL (ref 8.4–10.5)
Chloride: 101 mEq/L (ref 96–112)
Creatinine, Ser: 0.9 mg/dL (ref 0.4–1.2)
GFR: 70.17 mL/min (ref >60.00)
Potassium: 4 mEq/L (ref 3.5–5.1)
Sodium: 135 mEq/L (ref 135–145)
Total Bilirubin: 0.5 mg/dL (ref 0.3–1.2)

## 2013-10-24 LAB — CBC WITH DIFFERENTIAL/PLATELET
Eosinophils Absolute: 0.2 10*3/uL (ref 0.0–0.7)
HCT: 42.8 % (ref 36.0–46.0)
Lymphs Abs: 1.7 10*3/uL (ref 0.7–4.0)
MCHC: 33.6 g/dL (ref 30.0–36.0)
MCV: 92.6 fl (ref 78.0–100.0)
Monocytes Absolute: 0.5 10*3/uL (ref 0.1–1.0)
Neutrophils Relative %: 59.6 % (ref 43.0–77.0)
Platelets: 253 10*3/uL (ref 150.0–400.0)
RDW: 12.3 % (ref 11.5–14.6)
WBC: 6.4 10*3/uL (ref 4.5–10.5)

## 2013-10-24 LAB — TSH: TSH: 0.96 u[IU]/mL (ref 0.35–5.50)

## 2013-10-24 LAB — LIPID PANEL
Cholesterol: 217 mg/dL — ABNORMAL HIGH (ref 0–200)
VLDL: 40.6 mg/dL — ABNORMAL HIGH (ref 0.0–40.0)

## 2013-10-24 NOTE — Assessment & Plan Note (Signed)
Low cholesterol diet and exercise.  On simvastatin.  Follow lipid profile and liver function.

## 2013-10-24 NOTE — Assessment & Plan Note (Addendum)
Blood pressure is doing well on her current regimen.  Follow.  Follow metabolic panel.   

## 2013-10-24 NOTE — Assessment & Plan Note (Addendum)
States occurred in 2001.  Sees Dr Sherryll Burger.  Currently stable.  On aspirin daily.

## 2013-10-24 NOTE — Assessment & Plan Note (Addendum)
Discussed at length with her today.  Increased stress with her home situation.  Will give her the name of good counselors in town.  She is currently on cymbalta.  Follow.

## 2013-10-24 NOTE — Progress Notes (Signed)
Subjective:    Patient ID: Elizabeth Wyatt, female    DOB: 20-Nov-1956, 57 y.o.   MRN: [id number]  HPI 57 year old female with past history of CAD (followed by Dr Mariah Milling), CVA (followed by Dr Sherryll Burger), hypertension and  Hypercholesterolemia.  She comes in today for a scheduled follow up.  States she had a CVA in 2001.  Sees Dr Sherryll Burger.  Is on aspirin daily.  He also has her on Topamax.  Denies problems with headache or dizziness.  Has known CAD.  Sees Dr Mariah Milling.  Currently stable.  Denies any chest pain or tightness.  Breathing stable.  She has been followed at [facility] by Dr Luella Cook.  States has had two episodes of post menopausal bleeding.  Was worked up.  States surgery was discussed.  Never followed through with any procedures.  We discussed this at length today.  Has not had any recent bleeding, but still is concerning that she had two previous episodes.  She desires to f/u with another gyn.  She denies any abdominal pain or cramping.  No sob.  Describes increased stress with her family situation.  Husband is back living with her and her son.  Her son has autism.  Is apparently highly functioning.  Feels she is handling things relatively well.     Past Medical History  Diagnosis Date  . HLD (hyperlipidemia)   . HTN (hypertension)   . Asthma   . Obesity   . Allergy   . Personal history of tobacco use, presenting hazards to health   . Special screening for malignant neoplasms, colon   . TIA (transient ischemic attack)   . CAD (coronary artery disease)   . Personal history of colonic polyps   . Family history of malignant neoplasm of breast     Current Outpatient Prescriptions on File Prior to Visit  Medication Sig Dispense Refill  . albuterol (PROAIR HFA) 108 (90 BASE) MCG/ACT inhaler Inhale 2 puffs into the lungs every 6 (six) hours as needed.        Marland Kitchen aspirin 81 MG tablet Take 81 mg by mouth daily.      Marland Kitchen BYSTOLIC 10 MG tablet TAKE 1 TABLET (10 MG TOTAL) BY MOUTH DAILY.  30 tablet   3  . cetirizine (ZYRTEC) 10 MG tablet Take 10 mg by mouth as needed.        . DULoxetine (CYMBALTA) 30 MG capsule Take 30 mg by mouth daily.        . Fluticasone Propionate, Inhal, (FLOVENT DISKUS) 100 MCG/BLIST AEPB Inhale into the lungs as needed.        . nitroGLYCERIN (NITROSTAT) 0.4 MG SL tablet Place 0.4 mg under the tongue every 5 (five) minutes as needed.      Marland Kitchen omeprazole (PRILOSEC) 20 MG capsule Take 20 mg by mouth daily.        Marland Kitchen topiramate (TOPAMAX) 25 MG capsule Take 25 mg by mouth daily.        . verapamil (CALAN-SR) 240 MG CR tablet TAKE ONE TABLET BY MOUTH AT BEDTIME  30 tablet  6  . potassium chloride (K-DUR) 10 MEQ tablet Take 10 mEq by mouth daily as needed.       No current facility-administered medications on file prior to visit.    Review of Systems Patient denies any headache, lightheadedness or dizziness.  No sinus or allergy symptoms.  No chest pain, tightness or palpitations.  No increased shortness of breath, cough or congestion.  No  nausea or vomiting.  No acid reflux.  No abdominal pain or cramping.  No bowel change, such as diarrhea, constipation, BRBPR or melana.  No urine change.   No vaginal bleeding currently.  Has had some post menopausal bleeding previously.  Two episodes.  Evaluated by gyn previously.  Never followed through with procedures for this (per pt).  Desires to see another gyn.  Previous rib pain has resolved.        Objective:   Physical Exam  Filed Vitals:   10/24/13 0802  BP: 110/70  Pulse: 67  Temp: 98.1 F (36.7 C)   Blood pressure recheck:  114/68, pulse 70  56 year old female in no acute distress.   HEENT:  Nares- clear.  Oropharynx - without lesions. NECK:  Supple.  Nontender.  No audible bruit.  HEART:  Appears to be regular. LUNGS:  No crackles or wheezing audible.  Respirations even and unlabored.  Good breath sounds bilaterally.   RADIAL PULSE:  Equal bilaterally.   ABDOMEN:  Soft, nontender.  Bowel sounds present and  normal.  No audible abdominal bruit.   EXTREMITIES:  No increased edema present.  DP pulses palpable and equal bilaterally.        Assessment & Plan:  RIB FRACTURE.  S/p rib fracture.  Pain has resolved.    HEALTH MAINTENANCE.  Still need records for review.   Will need to keep her on track with her physicals, mammograms and colonoscopy.  Apparently is followed by Dr Evette Cristal for her mammograms.    I spent 40 minutes with the patient and more than 50% of the time was spent in consultation regarding the above.

## 2013-10-24 NOTE — Assessment & Plan Note (Addendum)
Obtain records to review last colonoscopy. Schedule follow up colonoscopy when due.

## 2013-10-24 NOTE — Progress Notes (Signed)
Pre-visit discussion using our clinic review tool. No additional management support is needed unless otherwise documented below in the visit note.  

## 2013-10-27 ENCOUNTER — Encounter: Payer: Self-pay | Admitting: Internal Medicine

## 2013-10-27 DIAGNOSIS — N950 Postmenopausal bleeding: Secondary | ICD-10-CM | POA: Insufficient documentation

## 2013-10-27 NOTE — Assessment & Plan Note (Signed)
Has had two episodes.  Per her report, has never followed through with a complete w/up.  Will refer back to gyn for evaluation.  She request to see another gyn.  Will schedule.

## 2013-10-28 ENCOUNTER — Encounter: Payer: Self-pay | Admitting: *Deleted

## 2013-11-29 ENCOUNTER — Other Ambulatory Visit: Payer: Self-pay

## 2013-11-29 ENCOUNTER — Other Ambulatory Visit: Payer: Self-pay | Admitting: Cardiovascular Disease

## 2013-12-14 ENCOUNTER — Other Ambulatory Visit: Payer: Self-pay | Admitting: Internal Medicine

## 2013-12-17 ENCOUNTER — Other Ambulatory Visit: Payer: Self-pay | Admitting: Internal Medicine

## 2013-12-31 ENCOUNTER — Other Ambulatory Visit: Payer: Self-pay | Admitting: Cardiovascular Disease

## 2014-01-16 ENCOUNTER — Ambulatory Visit: Payer: Self-pay | Admitting: General Surgery

## 2014-01-17 ENCOUNTER — Encounter: Payer: Self-pay | Admitting: General Surgery

## 2014-01-27 ENCOUNTER — Ambulatory Visit (INDEPENDENT_AMBULATORY_CARE_PROVIDER_SITE_OTHER): Payer: Managed Care, Other (non HMO) | Admitting: General Surgery

## 2014-01-27 ENCOUNTER — Encounter: Payer: Self-pay | Admitting: General Surgery

## 2014-01-27 VITALS — BP 132/76 | HR 78 | Resp 14 | Ht 61.0 in | Wt 261.0 lb

## 2014-01-27 DIAGNOSIS — Z1231 Encounter for screening mammogram for malignant neoplasm of breast: Secondary | ICD-10-CM

## 2014-01-27 DIAGNOSIS — Z803 Family history of malignant neoplasm of breast: Secondary | ICD-10-CM

## 2014-01-27 DIAGNOSIS — Z860100 Personal history of colon polyps, unspecified: Secondary | ICD-10-CM

## 2014-01-27 NOTE — Progress Notes (Signed)
Patient ID: Elizabeth Wyatt, female   DOB: 04/17/1956, 58 y.o.   MRN: [id number]  Chief Complaint  Patient presents with  . Follow-up    1 year follow up bilateral screening mammogram    HPI Elizabeth Wyatt is a 58 y.o. female who presents for a breast evaluation. The most recent mammogram was done on 01/16/14. Patient does perform regular self breast checks and gets regular mammograms done. The patient denies any new breast problems at this time. Patient has gained 30 pounds since last year in February 2014.    HPI  Past Medical History  Diagnosis Date  . HLD (hyperlipidemia)   . HTN (hypertension)   . Asthma   . Obesity   . Allergy   . Personal history of tobacco use, presenting hazards to health   . Special screening for malignant neoplasms, colon   . TIA (transient ischemic attack)   . CAD (coronary artery disease)   . Personal history of colonic polyps   . Family history of malignant neoplasm of breast     Past Surgical History  Procedure Laterality Date  . Mouth surgery    . Dilation and curettage of uterus  2012  . Breast biopsy Right 2009  . Breast mass excision Right 2009  . Mouth surgery    . Colonoscopy  2008    [facility]  . Uterine fibroid surgery  2012  . Cardiac catheterization  2010    Dr. Mariah Milling with [facility]    Family History  Problem Relation Age of Onset  . Cancer Maternal Grandmother     cervical and uterine cancer  . Cancer Mother     breast, ovarian/uterine    Social History History  Substance Use Topics  . Smoking status: Former Smoker -- 3.00 packs/day for 3 years    Types: Cigarettes  . Smokeless tobacco: Never Used     Comment: tobacco use - no   . Alcohol Use: No     Comment: quit drinking years ago, used to drink on weekends    Allergies  Allergen Reactions  . Diphenhydramine Hcl     Increased heart rate  . Latex Other (See Comments)    Blisters,then peels skin  . Pamabrom     This medication is in midol and caused anuria  .  Sulfonamide Derivatives     vomiting    Current Outpatient Prescriptions  Medication Sig Dispense Refill  . aspirin 81 MG tablet Take 81 mg by mouth daily.      Marland Kitchen BYSTOLIC 10 MG tablet TAKE 1 TABLET (10 MG TOTAL) BY MOUTH DAILY.  30 tablet  3  . cetirizine (ZYRTEC) 10 MG tablet Take 10 mg by mouth as needed.        . Fluticasone Propionate, Inhal, (FLOVENT DISKUS) 100 MCG/BLIST AEPB Inhale into the lungs as needed.        . nitroGLYCERIN (NITROSTAT) 0.4 MG SL tablet Place 0.4 mg under the tongue every 5 (five) minutes as needed.      Marland Kitchen omeprazole (PRILOSEC) 20 MG capsule Take 20 mg by mouth daily.        Marland Kitchen PROAIR HFA 108 (90 BASE) MCG/ACT inhaler 2 (TWO) PUFF(S), INHALATION, AS NEEDED  8.5 each  1  . topiramate (TOPAMAX) 25 MG capsule Take 25 mg by mouth daily.        . verapamil (CALAN-SR) 240 MG CR tablet TAKE ONE TABLET BY MOUTH AT BEDTIME  30 tablet  6  . potassium chloride (  K-DUR) 10 MEQ tablet Take 10 mEq by mouth daily as needed.       No current facility-administered medications for this visit.    Review of Systems Review of Systems  Constitutional: Negative.   Respiratory: Negative.   Cardiovascular: Negative.     Blood pressure 132/76, pulse 78, resp. rate 14, height 5\' 1"  (1.549 m), weight 261 lb (118.389 kg).  Physical Exam Physical Exam  Constitutional: She is oriented to person, place, and time. She appears well-developed and well-nourished.  Eyes: Conjunctivae are normal.  Neck: Neck supple.  Cardiovascular: Normal rate, regular rhythm and normal heart sounds.   Pulmonary/Chest: Breath sounds normal. Right breast exhibits no inverted nipple, no mass, no nipple discharge, no skin change and no tenderness. Left breast exhibits no inverted nipple, no mass, no nipple discharge, no skin change and no tenderness.  Abdominal: Soft. Bowel sounds are normal.  Lymphadenopathy:    She has no cervical adenopathy.    She has no axillary adenopathy.  Neurological: She is  alert and oriented to person, place, and time.  Skin: Skin is warm and dry.    Data Reviewed Mammogram reviewed   Assessment    Stable breast exam. FH of breast cancer. Weight gain.     Plan   Discussed her weight issue-pt is aware of this. Patient to return in one year bilateral screening mammogram.        Gerlene BurdockSANKAR,Fancy Dunkley G 01/27/2014, 11:53 AM

## 2014-01-27 NOTE — Patient Instructions (Addendum)
Patient to return in one year bilateral screening mammogram. Continue monthly self breast exam. To discuss her wight status with PCP

## 2014-02-12 ENCOUNTER — Encounter: Payer: Commercial Indemnity | Admitting: Internal Medicine

## 2014-02-14 ENCOUNTER — Other Ambulatory Visit: Payer: Self-pay | Admitting: Internal Medicine

## 2014-02-17 NOTE — Telephone Encounter (Signed)
Refilled x 2 

## 2014-02-17 NOTE — Telephone Encounter (Signed)
Topamax directions received from pharmacy differed from directions in patient's chart. Confirmed with patient that she takes Topamax 50 mg 2 tablets and bedtime, previously Rx'd by Dr. Sherryll BurgerShah, but does not see him anymore. Ok refill?

## 2014-02-17 NOTE — Telephone Encounter (Signed)
Ok to refill x 2  

## 2014-02-17 NOTE — Telephone Encounter (Signed)
Left message, requesting call back to confirm directions.

## 2014-02-19 ENCOUNTER — Telehealth: Payer: Self-pay | Admitting: *Deleted

## 2014-02-19 NOTE — Telephone Encounter (Signed)
Pt called asking for something to calm her nerves. She states that her husband walked out on her & her son last night. She is having a hard time dealing with the stress right now. She also mentioned that she wanted her son to be seen as a new patient and you refused to see him. I informed patient that she really needs to be seen to discuss the need for medication and do that we are able to document the reasoning for the medication. I also informed pt that Dr. Lorin PicketScott sis not refuse to see her son, we just simply do not have room for any new patients. Dr. Lorin PicketScott is not taking new patients at this time. Patient mentioned that if we could not see both her & her son or if we could not give her any medication or help her out during her crisis, that she needs to be referred to a new physician. They need to be seeing the same physician aynway. She still requested that something be called in for her nerves. Please advise

## 2014-02-19 NOTE — Telephone Encounter (Signed)
Pt notified & accepted appointment on 3/18 @ 12:45.

## 2014-02-19 NOTE — Telephone Encounter (Signed)
I agree with needing an evaluation prior to prescribing medication (so that I know how best to treat).  I reviewed my schedule and I am already working in pts during lunch this week.  I can work her in at 12:45 next Wednesday (02/26/14). Regarding her message about her and her son needing to see the same physician - I am not accepting new pts at this time.  Regarding referral to another physician, if she feels they both need to be seen by the same physician, then she would need to pick the primary care physician.  I cannot refer her to another primary care physician.  If this is what she desires, she can let us know the name of the physician and we can help facilitate transfer of her records.  I will see her next week though if she desires to continue to see me.   Let me know if any problems.  Thanks.

## 2014-02-24 NOTE — Telephone Encounter (Signed)
Noted  

## 2014-02-24 NOTE — Telephone Encounter (Signed)
Pt called back to cancel her appointment. She has an appt with a counselor also & feels that it would be more cost effective to see them & let them treat her first.

## 2014-02-26 ENCOUNTER — Ambulatory Visit: Payer: Commercial Indemnity | Admitting: Internal Medicine

## 2014-03-07 ENCOUNTER — Ambulatory Visit (INDEPENDENT_AMBULATORY_CARE_PROVIDER_SITE_OTHER): Payer: Commercial Indemnity | Admitting: Cardiovascular Disease

## 2014-03-07 ENCOUNTER — Encounter: Payer: Self-pay | Admitting: Cardiovascular Disease

## 2014-03-07 VITALS — BP 120/70 | HR 73 | Ht 62.0 in | Wt 257.5 lb

## 2014-03-07 DIAGNOSIS — I10 Essential (primary) hypertension: Secondary | ICD-10-CM

## 2014-03-07 DIAGNOSIS — R079 Chest pain, unspecified: Secondary | ICD-10-CM

## 2014-03-07 DIAGNOSIS — E663 Overweight: Secondary | ICD-10-CM

## 2014-03-07 DIAGNOSIS — R609 Edema, unspecified: Secondary | ICD-10-CM

## 2014-03-07 DIAGNOSIS — F4323 Adjustment disorder with mixed anxiety and depressed mood: Secondary | ICD-10-CM

## 2014-03-07 DIAGNOSIS — R0789 Other chest pain: Secondary | ICD-10-CM

## 2014-03-07 DIAGNOSIS — E785 Hyperlipidemia, unspecified: Secondary | ICD-10-CM

## 2014-03-07 NOTE — Patient Instructions (Addendum)
You are doing well. No medication changes were made.  The phone number for Dr. Sherryll BurgerShah and Marguerite OleaMoffett is : [phone] Phineas RealCharles Drew:  [phone]  Please call us if you have new issues that need to be addressed before your next appt.

## 2014-03-07 NOTE — Assessment & Plan Note (Signed)
No significant edema on today's visit 

## 2014-03-07 NOTE — Assessment & Plan Note (Signed)
Significant stress recently with her husband leaving her and her son. Financial stressors in addition to emotional stressors. She does see a Veterinary surgeoncounselor. She is requesting Ativan and we have deferred this to primary care. She is indicated that she would like to change primary care physicians so her son can be seen with her at the same office. Recommended she start a walking program for stress relief.  She had requested that we talk with her son about her condition and that she would do okay in the future. We did talk with him about her mothers condition at her request. He was accepting of the fact that she is under significant stress but there is no active cardiac issues that need further workup at this time. He seemed to understand this

## 2014-03-07 NOTE — Assessment & Plan Note (Signed)
Blood pressure is well controlled on today's visit. No changes made to the medications. 

## 2014-03-07 NOTE — Assessment & Plan Note (Signed)
In the setting of stress, chest pain is very atypical. Prior catheterization with no significant CAD. No further workup at this time

## 2014-03-07 NOTE — Assessment & Plan Note (Signed)
Most recent lipid panel not available. Currently not on a statin 

## 2014-03-07 NOTE — Assessment & Plan Note (Signed)
We have encouraged continued exercise, careful diet management in an effort to lose weight. 

## 2014-03-07 NOTE — Progress Notes (Signed)
Patient ID: Elizabeth Wyatt, female    DOB: Mar 26, 1956, 58 y.o.   MRN: [id number]  HPI Comments: Elizabeth Wyatt is a 58 yo woman with PMH of morbid obesity, hypertension, hyperlipidemia, history of chest pain with cardiac catheterization May 2009  showing no significant coronary artery disease though she does have a circumflex coming off the ostium of the RCA, Who presents for routine followup. She has significant stress at home with her husband and son who has autism.   In followup today, she reports having significant stress at home. She reports that her husband recently left her. Hurtful things were said in front of her and her son. She has been having some palpitations, chest tightness, insomnia since the events. Relatively sedentary at baseline, no regular exercise. Weight continues to be a problem. Edema has improved.   No recent blood work. She is not taking diuretics. She has indicated that she needs a new primary care doctor as she would like to go to the same Dr. Where her son can be seen so they can be seen at the same time  Stress test 04/2008:  Cardiac catheter at the same time   EKG shows normal sinus rhythm with rate 73 beats per minute and no significant ST or T wave changes   Outpatient Encounter Prescriptions as of 03/07/2014  Medication Sig  . aspirin 81 MG tablet Take 81 mg by mouth daily.  Marland Kitchen. BYSTOLIC 10 MG tablet TAKE 1 TABLET (10 MG TOTAL) BY MOUTH DAILY.  . cetirizine (ZYRTEC) 10 MG tablet Take 10 mg by mouth as needed.    . Fluticasone Propionate, Inhal, (FLOVENT DISKUS) 100 MCG/BLIST AEPB Inhale into the lungs as needed.    . nitroGLYCERIN (NITROSTAT) 0.4 MG SL tablet Place 0.4 mg under the tongue every 5 (five) minutes as needed.  Marland Kitchen. omeprazole (PRILOSEC) 20 MG capsule Take 20 mg by mouth daily.    . potassium chloride (K-DUR) 10 MEQ tablet Take 10 mEq by mouth daily as needed.  Marland Kitchen. PROAIR HFA 108 (90 BASE) MCG/ACT inhaler 2 (TWO) PUFF(S), INHALATION, AS NEEDED  .  topiramate (TOPAMAX) 25 MG capsule Take 25 mg by mouth daily.    Marland Kitchen. topiramate (TOPAMAX) 50 MG tablet TAKE 2 TABLETS BY MOUTH AT BEDTIME  . verapamil (CALAN-SR) 240 MG CR tablet TAKE ONE TABLET BY MOUTH AT BEDTIME      Review of Systems  Constitutional: Negative.   HENT: Negative.   Eyes: Negative.   Respiratory: Negative.   Cardiovascular: Negative.   Gastrointestinal: Negative.   Endocrine: Negative.   Musculoskeletal: Negative.   Skin: Negative.   Allergic/Immunologic: Negative.   Neurological: Negative.   Hematological: Negative.   Psychiatric/Behavioral: The patient is nervous/anxious.   All other systems reviewed and are negative.    BP 120/70  Pulse 73  Ht 5\' 2"  (1.575 m)  Wt 257 lb 8 oz (116.801 kg)  BMI 47.09 kg/m2  Physical Exam  Nursing note and vitals reviewed. Constitutional: She is oriented to person, place, and time. She appears well-developed and well-nourished.   obese  HENT:  Head: Normocephalic.  Nose: Nose normal.  Mouth/Throat: Oropharynx is clear and moist.  Eyes: Conjunctivae are normal. Pupils are equal, round, and reactive to light.  Neck: Normal range of motion. Neck supple. No JVD present.  Cardiovascular: Normal rate, regular rhythm, S1 normal, S2 normal and intact distal pulses.  Exam reveals no gallop and no friction rub.   Murmur heard. Pulmonary/Chest: Effort normal and breath  sounds normal. No respiratory distress. She has no wheezes. She has no rales. She exhibits no tenderness.  Abdominal: Soft. Bowel sounds are normal. She exhibits no distension. There is no tenderness.  Musculoskeletal: Normal range of motion. She exhibits no edema and no tenderness.  Lymphadenopathy:    She has no cervical adenopathy.  Neurological: She is alert and oriented to person, place, and time. Coordination normal.  Skin: Skin is warm and dry. No rash noted. No erythema.  Psychiatric: She has a normal mood and affect. Her behavior is normal. Judgment and  thought content normal.    Assessment and Plan

## 2014-03-10 NOTE — Telephone Encounter (Signed)
This encounter was created in error - please disregard.

## 2014-03-28 ENCOUNTER — Other Ambulatory Visit: Payer: Self-pay | Admitting: Cardiovascular Disease

## 2014-04-11 ENCOUNTER — Other Ambulatory Visit: Payer: Self-pay | Admitting: Internal Medicine

## 2014-05-06 ENCOUNTER — Other Ambulatory Visit: Payer: Self-pay | Admitting: Internal Medicine

## 2014-05-06 ENCOUNTER — Other Ambulatory Visit: Payer: Self-pay | Admitting: Cardiovascular Disease

## 2014-06-15 ENCOUNTER — Other Ambulatory Visit: Payer: Self-pay | Admitting: Internal Medicine

## 2014-06-17 ENCOUNTER — Other Ambulatory Visit: Payer: Self-pay | Admitting: Cardiovascular Disease

## 2014-07-14 ENCOUNTER — Other Ambulatory Visit: Payer: Self-pay | Admitting: Cardiovascular Disease

## 2014-07-14 ENCOUNTER — Telehealth: Payer: Self-pay

## 2014-07-14 NOTE — Telephone Encounter (Signed)
Spoke w/ pt.  She reports 5-8/10 chest pain for the past 30 mins with vomiting. Reports that she was lying down on her bed when it started.  Denies sob, sweating, or radiation of pain.  Reports that her son is autistic and she is caring for him alone.  Pt has not taken nitro, as this has expired.   Spoke w/ Dr. Mariah MillingGollan who reviewed pt's chart and recommends that pt utilize her stress reducing techniques, as sx are not cardiac related. Advised pt of this.  She is agreeable, but states that her counselor is several hours away from her and has not given her any meds. Pt verbalizes understanding and will contact her PCP, as well.  Asked her to call back if we can assist her further.

## 2014-07-15 ENCOUNTER — Other Ambulatory Visit: Payer: Self-pay | Admitting: Cardiovascular Disease

## 2014-07-15 NOTE — Telephone Encounter (Signed)
Needs refill for buloxetine.

## 2014-07-15 NOTE — Telephone Encounter (Signed)
We do not refill Cymbalta pt must contact primary care for Rx only cardiac meds.

## 2014-08-08 ENCOUNTER — Ambulatory Visit: Payer: Self-pay | Admitting: Obstetrics and Gynecology

## 2014-08-13 ENCOUNTER — Other Ambulatory Visit: Payer: Self-pay | Admitting: Internal Medicine

## 2014-09-30 DIAGNOSIS — R252 Cramp and spasm: Secondary | ICD-10-CM | POA: Insufficient documentation

## 2014-09-30 DIAGNOSIS — N950 Postmenopausal bleeding: Secondary | ICD-10-CM | POA: Insufficient documentation

## 2014-09-30 DIAGNOSIS — N393 Stress incontinence (female) (male): Secondary | ICD-10-CM | POA: Insufficient documentation

## 2014-10-13 ENCOUNTER — Encounter: Payer: Self-pay | Admitting: Cardiovascular Disease

## 2015-01-28 ENCOUNTER — Encounter: Payer: Self-pay | Admitting: General Surgery

## 2015-01-29 ENCOUNTER — Ambulatory Visit: Payer: Commercial Indemnity | Admitting: Cardiovascular Disease

## 2015-02-02 ENCOUNTER — Ambulatory Visit (INDEPENDENT_AMBULATORY_CARE_PROVIDER_SITE_OTHER): Payer: Commercial Indemnity | Admitting: Cardiovascular Disease

## 2015-02-02 ENCOUNTER — Encounter: Payer: Self-pay | Admitting: Cardiovascular Disease

## 2015-02-02 VITALS — BP 120/78 | HR 72 | Ht 62.0 in | Wt 251.8 lb

## 2015-02-02 DIAGNOSIS — R0789 Other chest pain: Secondary | ICD-10-CM

## 2015-02-02 DIAGNOSIS — I10 Essential (primary) hypertension: Secondary | ICD-10-CM

## 2015-02-02 DIAGNOSIS — E785 Hyperlipidemia, unspecified: Secondary | ICD-10-CM

## 2015-02-02 DIAGNOSIS — F4323 Adjustment disorder with mixed anxiety and depressed mood: Secondary | ICD-10-CM

## 2015-02-02 MED ORDER — SIMVASTATIN 20 MG PO TABS: 20.0000 mg | ORAL_TABLET | Freq: Every day | ORAL | Status: DC

## 2015-02-02 MED ORDER — VERAPAMIL HCL ER 240 MG PO TBCR: 240.0000 mg | EXTENDED_RELEASE_TABLET | Freq: Every day | ORAL | Status: DC

## 2015-02-02 MED ORDER — NEBIVOLOL HCL 10 MG PO TABS: 10.0000 mg | ORAL_TABLET | Freq: Every day | ORAL | Status: DC

## 2015-02-02 NOTE — Assessment & Plan Note (Signed)
Previously with atypical chest pain. No coronary artery disease by catheterization in the past. No further testing at this time

## 2015-02-02 NOTE — Assessment & Plan Note (Signed)
Blood pressure is well controlled on today's visit. No changes made to the medications. 

## 2015-02-02 NOTE — Progress Notes (Signed)
Patient ID: Elizabeth Wyatt, female    DOB: Oct 13, 1956, 59 y.o.   MRN: [id number]  HPI Comments: Elizabeth Wyatt is a 59 yo woman with PMH of morbid obesity, hypertension, hyperlipidemia, history of chronic chest pain with cardiac catheterization May 2009  showing no significant coronary artery disease (she does have a circumflex coming off the ostium of the RCA) Who presents for routine followup of her chest pain symptoms. She has significant stress at home with her husband and son who has autism.   Most of her visit today was spent discussing her continued stress at home. She reports that her husband recently left her. Hurtful things were said in front of her and her son.  She did call in August 2015 with chest pain. Also with some vomiting. He was felt to be noncardiac. No further testing at that time. Since then no significant chest pain symptoms. She is no longer taking simvastatin but she does not remember where this was stopped  Relatively sedentary at baseline, no regular exercise. Weight continues to be a problem. Having problems with money. Lies on support from her husband who lives in [place]  EKG on today's visit shows normal sinus rhythm with rate 72 bpm, no significant ST or T-wave changes   Stress test 04/2008:  Cardiac catheter at the same time    Allergies  Allergen Reactions  . Diphenhydramine Hcl     Increased heart rate  . Latex Other (See Comments)    Blisters,then peels skin  . Pamabrom     This medication is in midol and caused anuria  . Sulfonamide Derivatives     vomiting    Outpatient Encounter Prescriptions as of 02/02/2015  Medication Sig  . aspirin 81 MG tablet Take 81 mg by mouth daily.  . cetirizine (ZYRTEC) 10 MG tablet Take 10 mg by mouth as needed.    . etodolac (LODINE) 400 MG tablet Take 400 mg by mouth 2 (two) times daily.   . nebivolol (BYSTOLIC) 10 MG tablet Take 1 tablet (10 mg total) by mouth daily.  . nitroGLYCERIN (NITROSTAT) 0.4 MG SL  tablet Place 0.4 mg under the tongue every 5 (five) minutes as needed.  Marland Kitchen omeprazole (PRILOSEC) 20 MG capsule Take 20 mg by mouth daily.    Marland Kitchen PROAIR HFA 108 (90 BASE) MCG/ACT inhaler 2 (TWO) PUFF(S), INHALATION, AS NEEDED  . topiramate (TOPAMAX) 50 MG tablet TAKE 2 TABLETS BY MOUTH AT BEDTIME  . verapamil (CALAN-SR) 240 MG CR tablet Take 1 tablet (240 mg total) by mouth at bedtime.  . [DISCONTINUED] BYSTOLIC 10 MG tablet TAKE 1 TABLET (10 MG TOTAL) BY MOUTH DAILY.  . [DISCONTINUED] verapamil (CALAN-SR) 240 MG CR tablet TAKE ONE TABLET BY MOUTH AT BEDTIME  . simvastatin (ZOCOR) 20 MG tablet Take 1 tablet (20 mg total) by mouth at bedtime.  . [DISCONTINUED] BYSTOLIC 10 MG tablet TAKE 1 TABLET (10 MG TOTAL) BY MOUTH DAILY. (Patient not taking: Reported on 02/02/2015)  . [DISCONTINUED] verapamil (CALAN-SR) 240 MG CR tablet TAKE ONE TABLET BY MOUTH AT BEDTIME (Patient not taking: Reported on 02/02/2015)    Past Medical History  Diagnosis Date  . HLD (hyperlipidemia)   . HTN (hypertension)   . Asthma   . Obesity   . Allergy   . Personal history of tobacco use, presenting hazards to health   . Special screening for malignant neoplasms, colon   . TIA (transient ischemic attack)   . CAD (coronary artery disease)   .  Personal history of colonic polyps   . Family history of malignant neoplasm of breast   . Broken rib     Past Surgical History  Procedure Laterality Date  . Mouth surgery    . Dilation and curettage of uterus  2012  . Breast biopsy Right 2009  . Breast mass excision Right 2009  . Mouth surgery    . Colonoscopy  2008    [facility]  . Uterine fibroid surgery  2012  . Cardiac catheterization  2010    Dr. Mariah MillingGollan with [facility]    Social History  reports that she has quit smoking. Her smoking use included Cigarettes. She has a 9 pack-year smoking history. She has never used smokeless tobacco. She reports that she does not drink alcohol or use illicit drugs.  Family History family  history includes Cancer in her maternal grandmother and mother.      Review of Systems  Constitutional: Negative.   Respiratory: Negative.   Cardiovascular: Negative.   Gastrointestinal: Negative.   Musculoskeletal: Negative.   Skin: Negative.   Neurological: Negative.   Hematological: Negative.   Psychiatric/Behavioral: The patient is nervous/anxious.   All other systems reviewed and are negative.   BP 120/78 mmHg  Pulse 72  Ht 5\' 2"  (1.575 m)  Wt 251 lb 12 oz (114.193 kg)  BMI 46.03 kg/m2  Physical Exam  Constitutional: She is oriented to person, place, and time. She appears well-developed and well-nourished.   obese  HENT:  Head: Normocephalic.  Nose: Nose normal.  Mouth/Throat: Oropharynx is clear and moist.  Eyes: Conjunctivae are normal. Pupils are equal, round, and reactive to light.  Neck: Normal range of motion. Neck supple. No JVD present.  Cardiovascular: Normal rate, regular rhythm, S1 normal, S2 normal and intact distal pulses.  Exam reveals no gallop and no friction rub.   Murmur heard. Pulmonary/Chest: Effort normal and breath sounds normal. No respiratory distress. She has no wheezes. She has no rales. She exhibits no tenderness.  Abdominal: Soft. Bowel sounds are normal. She exhibits no distension. There is no tenderness.  Musculoskeletal: Normal range of motion. She exhibits no edema or tenderness.  Lymphadenopathy:    She has no cervical adenopathy.  Neurological: She is alert and oriented to person, place, and time. Coordination normal.  Skin: Skin is warm and dry. No rash noted. No erythema.  Psychiatric: She has a normal mood and affect. Her behavior is normal. Judgment and thought content normal.    Assessment and Plan  Nursing note and vitals reviewed.

## 2015-02-02 NOTE — Assessment & Plan Note (Addendum)
Ideally she needs to see psychiatry given her numerous issues. She might even need medications. This was discussed with her. She's not particularly eager to see psychiatry

## 2015-02-02 NOTE — Patient Instructions (Signed)
You are doing well.  Please restart simvastatin 20 mg once a day  Please call us if you have new issues that need to be addressed before your next appt.  Your physician wants you to follow-up in: 12 months.  You will receive a reminder letter in the mail two months in advance. If you don't receive a letter, please call our office to schedule the follow-up appointment.

## 2015-02-02 NOTE — Assessment & Plan Note (Signed)
We have encouraged continued exercise, careful diet management in an effort to lose weight. 

## 2015-02-02 NOTE — Assessment & Plan Note (Signed)
Recommended she restart simvastatin 20 mg daily

## 2015-02-04 ENCOUNTER — Ambulatory Visit (INDEPENDENT_AMBULATORY_CARE_PROVIDER_SITE_OTHER): Payer: Managed Care, Other (non HMO) | Admitting: General Surgery

## 2015-02-04 ENCOUNTER — Encounter: Payer: Self-pay | Admitting: General Surgery

## 2015-02-04 VITALS — BP 130/78 | HR 74 | Resp 12 | Wt 251.0 lb

## 2015-02-04 DIAGNOSIS — Z803 Family history of malignant neoplasm of breast: Secondary | ICD-10-CM | POA: Diagnosis not present

## 2015-02-04 DIAGNOSIS — Z86010 Personal history of colonic polyps: Secondary | ICD-10-CM

## 2015-02-04 NOTE — Progress Notes (Signed)
Patient ID: Elizabeth Wyatt, female   DOB: Sep 13, 1956, 59 y.o.   MRN: [id number]  Chief Complaint  Patient presents with  . Follow-up    mammogram    HPI Elizabeth Wyatt is a 59 y.o. female who presents for a breast evaluation. The most recent mammogram was done on 01/22/15. Patient does perform regular self breast checks and gets regular mammograms done. The patient denies any new problems at this time.   HPI  Past Medical History  Diagnosis Date  . HLD (hyperlipidemia)   . HTN (hypertension)   . Asthma   . Obesity   . Allergy   . Personal history of tobacco use, presenting hazards to health   . Special screening for malignant neoplasms, colon   . TIA (transient ischemic attack)   . CAD (coronary artery disease)   . Personal history of colonic polyps   . Family history of malignant neoplasm of breast   . Broken rib   . Osteoarthritis     Past Surgical History  Procedure Laterality Date  . Mouth surgery    . Dilation and curettage of uterus  2012  . Breast biopsy Right 2009  . Breast mass excision Right 2009  . Mouth surgery    . Colonoscopy  2008, 2014    [facility]  . Uterine fibroid surgery  2012  . Cardiac catheterization  2010    Dr. Mariah Milling with [facility]    Family History  Problem Relation Age of Onset  . Cancer Maternal Grandmother     cervical and uterine cancer  . Cancer Mother     breast, ovarian/uterine    Social History History  Substance Use Topics  . Smoking status: Former Smoker -- 3.00 packs/day for 3 years    Types: Cigarettes  . Smokeless tobacco: Never Used     Comment: tobacco use - no   . Alcohol Use: No     Comment: quit drinking years ago, used to drink on weekends    Allergies  Allergen Reactions  . Diphenhydramine Hcl     Increased heart rate  . Latex Other (See Comments)    Blisters,then peels skin  . Pamabrom     This medication is in midol and caused anuria  . Sulfonamide Derivatives     vomiting    Current Outpatient  Prescriptions  Medication Sig Dispense Refill  . aspirin 81 MG tablet Take 81 mg by mouth daily.    . cetirizine (ZYRTEC) 10 MG tablet Take 10 mg by mouth as needed.      . etodolac (LODINE) 400 MG tablet Take 400 mg by mouth 2 (two) times daily.     . nebivolol (BYSTOLIC) 10 MG tablet Take 1 tablet (10 mg total) by mouth daily. 90 tablet 3  . nitroGLYCERIN (NITROSTAT) 0.4 MG SL tablet Place 0.4 mg under the tongue every 5 (five) minutes as needed.    Marland Kitchen omeprazole (PRILOSEC) 20 MG capsule Take 20 mg by mouth daily.      Marland Kitchen PROAIR HFA 108 (90 BASE) MCG/ACT inhaler 2 (TWO) PUFF(S), INHALATION, AS NEEDED 8.5 each 1  . simvastatin (ZOCOR) 20 MG tablet Take 1 tablet (20 mg total) by mouth at bedtime. 90 tablet 3  . topiramate (TOPAMAX) 50 MG tablet TAKE 2 TABLETS BY MOUTH AT BEDTIME 60 tablet 1  . verapamil (CALAN-SR) 240 MG CR tablet Take 1 tablet (240 mg total) by mouth at bedtime. 90 tablet 3   No current facility-administered medications for this  visit.    Review of Systems Review of Systems  Constitutional: Negative.   Respiratory: Negative.   Cardiovascular: Negative.     Blood pressure 130/78, pulse 74, resp. rate 12, weight 251 lb (113.853 kg).  Physical Exam Physical Exam  Constitutional: She is oriented to person, place, and time. She appears well-developed and well-nourished.  Eyes: Conjunctivae are normal. No scleral icterus.  Neck: Neck supple. No thyromegaly present.  Cardiovascular: Normal rate, regular rhythm and normal heart sounds.   No murmur heard. Pulmonary/Chest: Effort normal and breath sounds normal. Right breast exhibits no inverted nipple, no mass, no nipple discharge, no skin change and no tenderness. Left breast exhibits no inverted nipple, no mass, no nipple discharge, no skin change and no tenderness.  Lymphadenopathy:    She has no cervical adenopathy.    She has no axillary adenopathy.  Neurological: She is alert and oriented to person, place, and time.   Skin: Skin is warm and dry.    Data Reviewed Mammogram category 1  Assessment    Stable exam. Family history of breast cancer and personal history of colon polyps.     Plan    1 year follow up.        Tammey Deeg G 02/04/2015, 9:17 AM

## 2015-02-04 NOTE — Patient Instructions (Signed)
Patient to return in 1 year with bilateral screening mammogram. Continue self breast exams. Call office for any new breast issues or concerns.  

## 2015-03-17 ENCOUNTER — Telehealth: Payer: Self-pay | Admitting: *Deleted

## 2015-03-17 NOTE — Telephone Encounter (Signed)
Pt c/o medication issue:  1. Name of Medication: Simvastatin    2. How are you currently taking this medication (dosage and times per day)? Stopped it. Tried it for a month but does not want to take it anymore   3. Are you having a reaction (difficulty breathing--STAT)? no  4. What is your medication issue? Only new thing that was introduced to her, having a lot of dizzy spells.   Pt sounds a bit upset, stated that "she does not think doctor will be upset because she is not changing doctor" She also stated that she is not a complainer but this is to much, she states she did try it for a month but it kept going So she stopped it Would like someone to call today please

## 2015-03-17 NOTE — Telephone Encounter (Signed)
Left message for pt to call back  °

## 2015-03-17 NOTE — Telephone Encounter (Signed)
Spoke w/ pt.  She states that she had dizziness since starting simvastatin "in every position; whether I was sitting, standing or lying; getting out of the bed or sitting down; thankfully it never happened while I was driving".  Reports that she stopped taking last week and her sx have resolved. She would like simvastatin added to her allergy list. She would like to know if Dr. Mariah MillingGollan would recommend another med that she can tolerate.  Please advise.  Thank you.

## 2015-03-18 NOTE — Telephone Encounter (Signed)
Could try atorvastatin 10 mg daily or lovastatin 20 mg daily Both are generic

## 2015-03-19 NOTE — Telephone Encounter (Signed)
Spoke w/ pt.  Advised her of Dr. Windell HummingbirdGollan's recommendation. She states that she does not want to take anything that would potentially have the same side effects as the simvastatin.  He lengthy discussion w/ pt about meds, but she states that she is in "enough pain" due to her arthritis and does not want to start any new meds.  She states that it is her choice to make this decision and prefers not to take any chol meds unless we can guarantee no side effects.

## 2015-03-19 NOTE — Telephone Encounter (Signed)
Left message for pt to call back  °

## 2015-05-20 ENCOUNTER — Encounter: Payer: Self-pay | Admitting: Licensed Clinical Social Worker
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
## 2015-05-21 ENCOUNTER — Other Ambulatory Visit: Payer: Self-pay

## 2015-05-21 ENCOUNTER — Ambulatory Visit (INDEPENDENT_AMBULATORY_CARE_PROVIDER_SITE_OTHER): Payer: Commercial Indemnity | Admitting: Licensed Clinical Social Worker

## 2015-05-21 DIAGNOSIS — G43909 Migraine, unspecified, not intractable, without status migrainosus: Secondary | ICD-10-CM | POA: Insufficient documentation

## 2015-05-21 DIAGNOSIS — E669 Obesity, unspecified: Secondary | ICD-10-CM | POA: Insufficient documentation

## 2015-05-21 DIAGNOSIS — F332 Major depressive disorder, recurrent severe without psychotic features: Secondary | ICD-10-CM | POA: Diagnosis not present

## 2015-05-21 DIAGNOSIS — F4310 Post-traumatic stress disorder, unspecified: Secondary | ICD-10-CM | POA: Diagnosis not present
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
## 2015-05-29 ENCOUNTER — Ambulatory Visit (INDEPENDENT_AMBULATORY_CARE_PROVIDER_SITE_OTHER): Payer: Commercial Indemnity | Admitting: Licensed Clinical Social Worker

## 2015-05-29 DIAGNOSIS — F332 Major depressive disorder, recurrent severe without psychotic features: Secondary | ICD-10-CM

## 2015-05-29 DIAGNOSIS — F4310 Post-traumatic stress disorder, unspecified: Secondary | ICD-10-CM

## 2015-05-29 NOTE — Progress Notes (Signed)
Patient:  Elizabeth Wyatt   DOB: [birth year]  MR Number: [id number]  Location: [facility], [address], [place], [place] [address]  Start: 10:05 a.m. End: 11:40 a.m.  Provider/Observer:     Miguel Dibble, MSW, LCSW   Reason For Service:     Outpatient therapy to address depression, anxiety and adjustment to marital separation  Content of Session:   [name] and her son discussed ongoing difficulties adjusting to the emotional impact of client's husband/son's father leaving the relationship.  Son was tearful while talking about his feelings and beliefs that he will need to step up as a leader to make some tough decisions.  The family has another cat that is sick and client's son was referring to sadness that he may have to make the decision to put the cat down.  Both reflected on their feelings and concerns for one another.  [name]'s son shared several of his journal entries and drawings which help him to redirect any negative emotions he experiences.  He gently reflected on his disapproval of father's choices while maintaining hope that his father will begin to make healthier choices as his biggest concern is that father's behaviors will lead to his death. [name] encouraged her son to talk to his father about the anger and disappointment he has yet son does not appear comfortable as of this time doing that.  Client's son used examples from one of his favorite television shows to demonstrate/explain his disappointment in his father and the needs that he has that his father has not met.  The two went back and forth in productive dialogue and [name] recognized a pattern of interacting with her son that may contribute to his painful emotions/agitation and with agreement to stop this behavior.  Both hugged LCSW and with voiced appreciation for outcome of therapy session today.  [name] and son indicated individually that they felt much better.  Participation  Level:   Active  Participation Quality:  Appropriate      Behavioral Observation:  Casual, Alert, and Appropriate and Depressed.   Current Psychosocial Factors: Coping with marital separation, financial, social and occupational stressors  Patient Progress:   Slow yet steady.  Suicide Ideation/Intention/Plan:  None  Target Goals:   Verbalize the various feelings associated with grieving the loss of the relationship.  Interventions Strategy:  Supportive psychotherapy with insight was provided to client and her son who accompanied her today around understanding the emotional impact of separation/divorce.  Discussed the stages of grief/grief reactions and normalized client and son's feelings and emotional outbursts.  Assessed for signs of violence/aggression given son's past and client's concerns regarding son's anger.  None present during session or remotely. LCSW also provided client and son much emotional support and encouragement while validating their emotional experiences. Reinforced importance of directing feelings, especially anger to appropriate person and/or situation and offering one another personal space and respecting need for privacy.  Discussed strategies for not internalizing the choices made by client's husband/son's father and loaned client a book about coping when with someone with Narcissism.  Commended both on use of hobbies, crafts and journaling to serve as distraction and self-care to express unpleasant and painful emotions.   Diagnosis:   Major Depressive Disorder, Recurrent, Severe, Without Psychosis  PTSD  Partner/Marital Relationship Conflict  PLAN:  Follow up in one week for OPT.  Client will take prescribed medications and follow up with her medical doctor for psychotropic refills or choose  another Psychiatrist to see for medication management. [name] will contact crisis services in event son's behaviors become uncontrollable and/or present harm to himself or to  her.     Miguel Dibble, LCSW 05/29/2015

## 2015-06-03 ENCOUNTER — Ambulatory Visit (INDEPENDENT_AMBULATORY_CARE_PROVIDER_SITE_OTHER): Payer: Commercial Indemnity | Admitting: Licensed Clinical Social Worker

## 2015-06-03 DIAGNOSIS — F332 Major depressive disorder, recurrent severe without psychotic features: Secondary | ICD-10-CM

## 2015-06-03 DIAGNOSIS — F4310 Post-traumatic stress disorder, unspecified: Secondary | ICD-10-CM
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
## 2015-06-05 ENCOUNTER — Ambulatory Visit: Payer: Self-pay | Admitting: Psychiatry

## 2015-06-10 ENCOUNTER — Ambulatory Visit (INDEPENDENT_AMBULATORY_CARE_PROVIDER_SITE_OTHER): Payer: Commercial Indemnity | Admitting: Licensed Clinical Social Worker

## 2015-06-10 DIAGNOSIS — F4310 Post-traumatic stress disorder, unspecified: Secondary | ICD-10-CM | POA: Diagnosis not present

## 2015-06-10 DIAGNOSIS — F332 Major depressive disorder, recurrent severe without psychotic features: Secondary | ICD-10-CM | POA: Diagnosis not present

## 2015-06-10 NOTE — Progress Notes (Signed)
THERAPIST PROGRESS NOTE   Patient:  Elizabeth Wyatt   DOB: 31-Jul-1956  MR Number: [id number]  Location: [facility], [address], [place], [place] [address]  Start: 11:00 a.m.  End: 12:25 p.m.  Provider/Observer:     Felecia Janina Dalaysia Harms, MSW, LCSW   Participation Level: Active  Behavioral Response: CasualAlertAngry, Anxious and Depressed  Type of Therapy: Family Therapy  Treatment Goals addressed: Communication: feelings/thoughts about loss and grief reaction; and Coping   Reason For Service:         Client returns to outpatient therapy to learn effective strategies for managing symptoms of depression, sadness, anger associated with feelings of rejection/abandonment since spouse left the home over one year ago.  Interventions: Solution Focused, Strength-based, Supportive, Family Systems and Reframing  Content of Session/Summary: Elizabeth Wyatt is a 59 y.o. female who presents today with her 60 year old son, Elizabeth Wyatt.  Both took turns talking about ongoing adjustment and feelings of grief with client expressing that she has lost hope.  Recent death and burial of a beloved pet has upset client's son and today he talked about the situation and disappointment in a family friend who was not available for him during this sad experience.  Client and her son both beginning to recognize how alone the two of them are and the friend's absence when the cat died reinforced this for them.  Elizabeth Wyatt's speech is hyper-verbal at times during the session and her mood is labile as evidenced by being quiet and calm then more loud and irritable and telling son to shut up.  She does not discuss what medications she is taking with LCSW.    The two do occasionally leave the home to go to the movies and out to eat yet overall there is little else that the two do.  Client has lost interest in getting to [facility] and without interest in being around people citing that she  cannot trust anyone and "I'm tired of taking care of everyone." Elizabeth Wyatt continues to devote his time to drawings and writing sci-fi/comic book fiction.  During session, Elizabeth Wyatt's interests in moving through the grief journey were not present per her report and she became slightly irritable with LCSW while stating "Do you get where I'm coming from? Do you not see the amount of pain I'm in?"  Client expressed concern about being alone just she and her son. She did acknowledge that LCSW's suggestion about the comic store was useful and although her son was a bit reluctant to try anything new in therapy, Elizabeth Wyatt encouraged her son to remain open to trying LCSW's suggestions.  He agreed.   Patient Progress:   Exceptional   Steady  X  Slow  Regressing  Stable   Maintaining   Barriers:   Poor Health  X  Limited Social Support  X  Financial status is poor Unsteady housing     Co-Morbid personality disorder    Suicidal/Homicidal: Negativewithout intent/plan  Therapist Response:  Assisted client to begin to increase insight and identification into patterns of certain behaviors and the resulting consequences.  LCSW encouraged sharing feelings of depression in order to clarify them and gain insight as to causes.  Psycho-education on various family dynamics, communication styles and empowered client and son to explore how they want to see their family look now that the husband/father has decided to exit the family.  Validated Elizabeth Wyatt and son's feelings while offering a sense of hope that  with time and an increasingly healthier outlook on herself that is less self-defeating she will begin to re-invest in her life to develop a life worth living.   LCSW explored options that would assist them to expand their social network and recommended some comic book stores in the area that son may enjoy hanging out at and sharing his work with others. Processed with client's son the connections drawn between his story characters & themes and his  own painful emotions.  Gently reinforced with client that the grieving process is unique yet can still become an opportunity for self-discovery and personal growth.  Plan: Return again in one week.  Elizabeth Wyatt will begin to open up about what she needs to move through the grieving process and son will be assisted with various hand-outs to help him to over-come expressive issues.  LCSW will assess client's use of psychotropics and coordinate her care with PCP and/or assist with a referral to a new Psychiatrist PRN.  LCSW will continue assessing family needs and coordinate referrals PRN.  Diagnosis: Major Depressive Disorder, Recurrent, Severe, without psychosis    PTSD   Grief  [name], LCSW 06/10/2015

## 2015-07-17 ENCOUNTER — Ambulatory Visit (INDEPENDENT_AMBULATORY_CARE_PROVIDER_SITE_OTHER): Payer: Commercial Indemnity | Admitting: Licensed Clinical Social Worker

## 2015-07-17 DIAGNOSIS — F332 Major depressive disorder, recurrent severe without psychotic features: Secondary | ICD-10-CM

## 2015-07-17 DIAGNOSIS — F4310 Post-traumatic stress disorder, unspecified: Secondary | ICD-10-CM

## 2015-07-17 NOTE — Progress Notes (Signed)
THERAPIST PROGRESS NOTE  Session Time:  10:10 a.m. - 11:20 a.m.  Participation Level: Active  Behavioral Response: Well GroomedAlertAngry, Depressed and Euthymic  Type of Therapy: Family Therapy  Treatment Goals addressed: Communication: about needs and individual grief reactions and Coping  Interventions: Strength-based, Supportive, Family Systems and Reframing  Summary: Elizabeth Wyatt is a 59 y.o. female who returns to [facility] after not being seen for a few weeks. Elizabeth Wyatt's son, Elizabeth Wyatt, is present during session and the two were assisted to have a conversation to discuss how the loss of the marital relationship and son's experienced loss of his father is impacting both of them.  Client expressed feelings of bitterness towards son who she believes needs to confront his father about his behaviors and how these are affecting the family. Elizabeth Wyatt explained to LCSW and his mother that he did not know what to say to his father and was honest with Elizabeth Wyatt that he was not ready to have this conversation.  Elizabeth Wyatt confessed "I don't know how to talk to him anymore."  He further expressed to his mother and LCSW the painful impact seeing his father in pictures with another women has had on him.  In terms of adjustment, Elizabeth Wyatt indicated "I have good days and bad days."  He indicated to [facility] that his best days are when the two of them are doing things together.  Elizabeth Wyatt's mood and affect remain angry and sad given marital separation and financial stressors yet overall she reported and appears to experience better days and was more euthymic in session.  She shared with LCSW various things she has bought for herself and that she has recently had her hair done and is feeling more feminine again.  Elizabeth Wyatt able to identify at least three support people who she is comfortable venting to and seeking feedback from.   Recent stressor was air conditioner being broken in the house and the two went days being hot and increased irritability  reported by both Elizabeth Wyatt and Elizabeth Wyatt. This is repaired and financial support from spouse continues.  She expressed to her son how difficult it is for her to be around his father but she will sacrifice to do this so that Elizabeth Wyatt is not alone with the estranged father since Elizabeth Wyatt does not trust him.  It appears Elizabeth Wyatt is okay with this arrangement and validated for Elizabeth Wyatt that this is difficult emotionally for her while also sharing honestly how he feels.  No new concerns voiced and the two continue to report positive outcome of ongoing therapy and voiced appreciation to LCSW for insights shared and support offered.  Through the session and with LCSW's questions and input, both reported feeling better than when initially in the session.  Suicidal/Homicidal: Negativewithout intent/plan  Therapist Response:  LCSW gently questioned both client and her son regarding their preferences and abilities at this time, especially in regards to having a conversation with the estranged spouse/father.  Validated each of their feelings and needs while also pointing out to [facility] that her hurt and son's hurt will be different given the nature of their relationships.  Normalized each's wants and desires as well as son's experience as not being ready to speak to his father about his infidelity and other inappropriate behaviors.  LCSW encouraged sharing feelings of depression in order to clarify them and gain insight as to causes.  Psycho-education on the various stages of grief/loss reactions and how things such as temperament, history of relationship with estranged person, emotional needs  and other family dynamics will influence how each cope with the losses.  Commended on increasing self care and encouraged the two of them to use their conflicts as a way to dig deeper into what is really going on with each of them emotionally and their thinking.  Emotional and social support were provided and urged ongoing expression of feelings.  Through  previous experience with other family's in similar situation, LCSW empowered client and son and to consider various coping strategies to continue to support their healing.  Plan: Return again in one week.  Elizabeth Wyatt will begin to open up about what she needs to move through the grieving process and son will be assisted with various hand-outs to help him to over-come expressive issues.  LCSW will assess client's use of psychotropics and coordinate her care with PCP and/or assist with a referral to a new Psychiatrist PRN.  LCSW will continue assessing family needs and coordinate referrals PRN.  Diagnosis: Major Depressive Disorder, Recurrent, Severe, without psychosis    PTSD   Grief  [name], LCSW 07/17/2015

## 2015-07-22 ENCOUNTER — Ambulatory Visit (INDEPENDENT_AMBULATORY_CARE_PROVIDER_SITE_OTHER): Payer: Commercial Indemnity | Admitting: Family Medicine

## 2015-07-22 ENCOUNTER — Encounter: Payer: Self-pay | Admitting: Family Medicine

## 2015-07-22 VITALS — BP 135/82 | HR 82 | Temp 98.2°F | Ht 62.0 in | Wt 254.8 lb

## 2015-07-22 DIAGNOSIS — F4310 Post-traumatic stress disorder, unspecified: Secondary | ICD-10-CM | POA: Diagnosis not present

## 2015-07-22 DIAGNOSIS — F32A Depression, unspecified: Secondary | ICD-10-CM

## 2015-07-22 DIAGNOSIS — B354 Tinea corporis: Secondary | ICD-10-CM | POA: Diagnosis not present

## 2015-07-22 DIAGNOSIS — F329 Major depressive disorder, single episode, unspecified: Secondary | ICD-10-CM | POA: Diagnosis not present

## 2015-07-22 DIAGNOSIS — F419 Anxiety disorder, unspecified: Secondary | ICD-10-CM | POA: Diagnosis not present

## 2015-07-22 DIAGNOSIS — I10 Essential (primary) hypertension: Secondary | ICD-10-CM | POA: Insufficient documentation

## 2015-07-22 MED ORDER — KETOCONAZOLE 2 % EX CREA: 1.0000 "application " | TOPICAL_CREAM | Freq: Two times a day (BID) | CUTANEOUS | Status: DC

## 2015-07-22 NOTE — Progress Notes (Signed)
Name: Elizabeth Wyatt   MRN: [id number]    DOB: 1956-11-11   Date:07/22/2015       Progress Note  Subjective  Chief Complaint  Chief Complaint  Patient presents with  . Acute Visit    Pt here for yeast under stomach, breast and thighs.    HPI  For f/u of HBP an depression.  Sees Psych and counselor re: depression.  C/o fungal skin infection in intertrigenous areas.   No problem-specific assessment & plan notes found for this encounter.   Past Medical History  Diagnosis Date  . HLD (hyperlipidemia)   . HTN (hypertension)   . Asthma   . Obesity   . Allergy   . Personal history of tobacco use, presenting hazards to health   . Special screening for malignant neoplasms, colon   . TIA (transient ischemic attack)   . CAD (coronary artery disease)   . Personal history of colonic polyps   . Family history of malignant neoplasm of breast   . Broken rib   . Osteoarthritis     Social History  Substance Use Topics  . Smoking status: Former Smoker -- 3.00 packs/day for 3 years    Types: Cigarettes  . Smokeless tobacco: Never Used     Comment: tobacco use - no   . Alcohol Use: No     Comment: quit drinking years ago, used to drink on weekends     Current outpatient prescriptions:  .  aspirin 81 MG tablet, Take 81 mg by mouth daily., Disp: , Rfl:  .  cetirizine (ZYRTEC) 10 MG tablet, Take 10 mg by mouth as needed.  , Disp: , Rfl:  .  DULoxetine (CYMBALTA) 30 MG capsule, Take 90 mg by mouth daily., Disp: , Rfl: 6 .  etodolac (LODINE) 400 MG tablet, Take 400 mg by mouth 2 (two) times daily. , Disp: , Rfl:  .  nebivolol (BYSTOLIC) 10 MG tablet, Take 1 tablet (10 mg total) by mouth daily., Disp: 90 tablet, Rfl: 3 .  nitroGLYCERIN (NITROSTAT) 0.4 MG SL tablet, Place 0.4 mg under the tongue every 5 (five) minutes as needed., Disp: , Rfl:  .  PRILOSEC OTC 20 MG tablet, Take 20 mg by mouth daily., Disp: , Rfl: 12 .  PROAIR HFA 108 (90 BASE) MCG/ACT inhaler, 2 (TWO) PUFF(S),  INHALATION, AS NEEDED, Disp: 8.5 each, Rfl: 1 .  topiramate (TOPAMAX) 50 MG tablet, TAKE 2 TABLETS BY MOUTH AT BEDTIME, Disp: 60 tablet, Rfl: 1 .  verapamil (VERELAN PM) 240 MG 24 hr capsule, Take 240 mg by mouth daily., Disp: , Rfl: 12 .  omeprazole (PRILOSEC) 20 MG capsule, Take 20 mg by mouth daily.  , Disp: , Rfl:  .  verapamil (CALAN-SR) 240 MG CR tablet, Take 1 tablet (240 mg total) by mouth at bedtime. (Patient not taking: Reported on 07/22/2015), Disp: 90 tablet, Rfl: 3  Allergies  Allergen Reactions  . Diphenhydramine Hcl     Increased heart rate  . Latex Other (See Comments)    Blisters,then peels skin  . Pamabrom     This medication is in midol and caused anuria  . Simvastatin Other (See Comments)    dizziness  . Sulfonamide Derivatives     vomiting    Review of Systems  Constitutional: Negative for fever, chills and malaise/fatigue.  HENT: Negative for hearing loss.   Eyes: Negative for blurred vision and double vision.  Respiratory: Negative for cough, sputum production, shortness of breath and wheezing.  Cardiovascular: Negative for chest pain, palpitations, orthopnea and leg swelling.  Gastrointestinal: Negative for heartburn, nausea, vomiting, abdominal pain, diarrhea and blood in stool.  Genitourinary: Negative for dysuria, urgency and frequency.  Skin: Negative for rash.  Neurological: Negative for dizziness, tremors, sensory change, focal weakness, weakness and headaches.  Psychiatric/Behavioral: Positive for depression. The patient is nervous/anxious.       Objective  Filed Vitals:   07/22/15 1039  BP: 135/82  Pulse: 82  Temp: 98.2 F (36.8 C)  TempSrc: Oral  Height: 5\' 2"  (1.575 m)  Weight: 254 lb 12.8 oz (115.577 kg)     Physical Exam  Constitutional: She is well-developed, well-nourished, and in no distress. No distress.  HENT:  Head: Normocephalic and atraumatic.  Neck: Normal range of motion. Neck supple. Carotid bruit is not present. No  thyromegaly present.  Cardiovascular: Normal rate, regular rhythm, normal heart sounds and intact distal pulses.  Exam reveals no gallop and no friction rub.   No murmur heard. Pulmonary/Chest: Effort normal and breath sounds normal. No respiratory distress. She has no wheezes. She has no rales.  Abdominal: Soft. Bowel sounds are normal. She exhibits no distension and no mass. There is no tenderness.  Musculoskeletal: She exhibits no edema.  Lymphadenopathy:    She has no cervical adenopathy.  Skin:  Mild fungal skin rash in intertrigenous areas of abd and below breasts.  Psychiatric:  Extremely talkative, cursing on occasion.  Can be agitated, esp. Re: her ex-spouse.  Vitals reviewed.     No results found for this or any previous visit (from the past 2160 hour(s)).   Assessment & Plan  There are no diagnoses linked to this encounter.  1. HYPERTENSION, BENIGN   2. Morbid obesity   3. Clinical depression   4. Anxiety   5. PTSD (post-traumatic stress disorder)   6. Essential hypertension   7. Tinea corporis  - ketoconazole (NIZORAL) 2 % cream; Apply 1 application topically 2 (two) times daily.  Dispense: 30 g; Refill: 3

## 2015-07-22 NOTE — Patient Instructions (Signed)
Continue current meds 

## 2015-07-23 ENCOUNTER — Ambulatory Visit (INDEPENDENT_AMBULATORY_CARE_PROVIDER_SITE_OTHER): Payer: Commercial Indemnity | Admitting: Licensed Clinical Social Worker

## 2015-07-23 DIAGNOSIS — F4310 Post-traumatic stress disorder, unspecified: Secondary | ICD-10-CM

## 2015-07-23 DIAGNOSIS — F332 Major depressive disorder, recurrent severe without psychotic features: Secondary | ICD-10-CM

## 2015-07-23 NOTE — Progress Notes (Signed)
THERAPIST PROGRESS NOTE  Session Time:  10:05 a.m. - 11:20 a.m.  Participation Level: Active  Behavioral Response: NeatAlertAnxious and Euthymic  Type of Therapy: Individual Therapy  Treatment Goals addressed: Coping  Interventions: Solution Focused, Strength-based, Supportive and Family Systems  Summary: Elizabeth Wyatt is a 59 y.o. female who returns to OPT to address coping with separation from spouse of over 20 years along with the financial and psychosocial stressors that accompany a separation.  Elizabeth Wyatt's mood was more euthymic than I have seen in previously and she has reflected on her relationship with her estranged spouse who she speaks with on occasion regarding financial needs and recently shared a few Bible scriptures with him in an effort to point out the impact of his choices and infidelity.  She denied becoming upset following this and her faith continues to be of help to her in trying to grasp the reality of this loss and detaching herself from her husband and his own emotional and addictive issues.  Elizabeth Wyatt talked more about family dynamics and her early experiences with spouse and his family and through a television series called "Outsiders" she has drawn connections about the core differences between she and spouse which appears and is reported to be aiding in her healing.  Additional stress is caring without respite for adult son with high functioning Autism.  Concerns about what will happen to son, Elizabeth Wyatt, following her death were discussed.  She was receptive to information provided by LCSW and would like to see both her son and herself participating in activities that give them both a purpose and something to look forward to.  Elizabeth Wyatt agreed for she and LCSW to continue the conversation at later session and she voiced understanding about various programs that son may be able to participate in and offer her some respite.  Elizabeth Wyatt remains uncomfortable and opposed to her son spending any  time alone with his father given the father's past and current behaviors/choices.  Her PCP, Dr. Juanetta Wyatt informed client that he will retire in two years and she is sad about this.  Elizabeth Wyatt notified LCSW that her PCP wanted her to speak to LCSW about her medications as the PCP has some voiced concerns about the amount of Cymbalta that she is on.  She is uncertain of the benefits of the medication yet agreed that she may be benefiting more than she realizes based on conversation with LCSW today.  Appropriate sadness and irritability were voiced yet overall this is much less compared to initial visits with LCSW and Dr. Mayford Knife in this practice who client has decided not to follow up with for medication management. She denied panics, denies SI, HI and no reported delusions or psychosis and no manic symptoms reported.  Sleep is fair as is appetite and she continues to participate in activities that she enjoys.  Elizabeth Wyatt explained to LCSW why she felt hesitant about returning to see Dr. Mayford Knife and attributed this to fear of medications be added or taken away because apparently around the time that she had her stroke a few years ago there were changes in her medications.  No additional concerns or needs voiced.  Her son has been less resistant to doing household tasks and no recent outbursts reported.  Suicidal/Homicidal: Negativewithout intent/plan   Therapist Response:  LCSW commended on increasing self care and encouraged Elizabeth Wyatt to contact [employer] regarding son's eligibility for services.  Assured her at next session, LCSW will share with her the information that LCSW  has about communities and activities for adults with Autism.  Validated her concerns about son's welfare/well-being after her death.  Emotional and social support were provided and urged ongoing expression of feelings.  Highlighted client's progress as observed by LCSW over the course of the past several months and empowered her to continue  investing in herself and her interests and to stay aware of negative thoughts and feelings that may increase her emotional distress.  Welcomed calls between sessions PRN.   Plan: Return again in one week.  Elizabeth Wyatt will begin to open up about what she needs to move through the grieving process. LCSW will continue to assess client's use of psychotropics and coordinate her care with PCP and/or assist with a referral to a new Psychiatrist PRN.  LCSW will continue assessing family needs and coordinate referrals PRN.  Diagnosis: Major Depressive Disorder, Recurrent, Severe, without psychosis    PTSD   Grief  [name], LCSW 07/23/2015

## 2015-07-29 ENCOUNTER — Ambulatory Visit (INDEPENDENT_AMBULATORY_CARE_PROVIDER_SITE_OTHER): Payer: Commercial Indemnity | Admitting: Licensed Clinical Social Worker

## 2015-07-29 DIAGNOSIS — F4310 Post-traumatic stress disorder, unspecified: Secondary | ICD-10-CM

## 2015-07-29 DIAGNOSIS — F331 Major depressive disorder, recurrent, moderate: Secondary | ICD-10-CM

## 2015-07-29 NOTE — Progress Notes (Signed)
THERAPIST PROGRESS NOTE  Session Time: 11:10 a.m. - 12:30 a.m.  Participation Level: Active  Behavioral Response: NeatAlertAnxious and Depressed  Type of Therapy: Family Therapy  Treatment Goals addressed: Coping  Interventions: Solution Focused, Strength-based, Supportive, Family Systems and Reframing  Summary: Elizabeth Wyatt is a 59 y.o. female who returns to OPT today with son, Elizabeth Wyatt.  The two discussed recent phone conversation that client had with estranged spouse that she admitted "Elizabeth Wyatt wasn't suppose to hear that. I thought he was in his bedroom."  Elizabeth Wyatt was tearful when talking about recent contacts with his father and now awareness of his mother's conversation with his father.  Appropriate sadness and anger voiced by both Elizabeth Wyatt and her son around the indecisive behaviors of Mr. Foland and his own un-treated medical and mental health issues that both believe influence his decisions to be away from the family.  Elizabeth Wyatt shared that the conversation concluded with her husband telling her that he no longer had the patience to tolerate her.  Client and her son talked about the emotional impact of feeling that they are treated like dolls to be played with when it's convenient for him.    "I don't think we've gone back as far as we were. I still think we're doing better" per Elizabeth Wyatt and her son agreed.  Elizabeth Wyatt was appropriately tearful as he shared with LCSW and his mother a recent journal entry that spoke to his fears and causes for the tears being shed as a result of his father's recent refusal to deposit adequate amount of money in the bank to take care of one of their sickly cats.  Additional emotions such as anger, rage, disappointment and fear were reported by son and shared during session.  Both denied or indicated that they felt fearful or in some way in danger as a result of Mr. Rondeau behaviors.  They have decided based on recent contacts with him and things that he has said, that  future contacts will be kept at a minimum with him.  There was talk of the three of them taking a trip to the beach together yet Elizabeth Wyatt and Elizabeth Wyatt now do not see this as a healthy choice for them.  Both continue to enjoy activities together and separately and overall identify progress in moving through this difficult situation, especially with client learning to accept the financial dependency she and son still have on the estranged husband.  No new or additional concerns voiced.  Both were open to LCSW's support and information provided on Autism activities, camps and communities with volunteer opportunities and resources in [place].  Appreciation to LCSW for support and care was voiced by both client and her son.  Suicidal/Homicidal: Negativewithout intent/plan   Therapist Response:  Emotional and social support were provided along with validation of their intense and complex feelings as a part of the grieving process of accepting the reality of the loss, moving away from bargaining in effort to salvage the relationship and leaning into the pain so that they can move forward and invest in their futures without Mr. Gordan Payment in it.  Urged ongoing expression of feelings.  Highlighted client's & son's progress as observed by LCSW over the course of the past several months and reinforced use of internal and external strengths and resources.  Active and reflective listening to assist both to accept the progress made while staying realistic that they will have painful moments/days.  Encouraged both to consider topics/themes for future sessions.  Welcomed  call back PRN between sessions.  Plan: Return again in one week.  Elizabeth Wyatt will begin to open up about what she needs to move through the grieving process. LCSW will continue to assess client's use of psychotropics and coordinate her care with PCP and/or assist with a referral to a new Psychiatrist PRN.  LCSW will continue assessing family needs and coordinate referrals  PRN.  Diagnosis: Major Depressive Disorder, Recurrent, Severe, without psychosis    PTSD   Grief  [name], LCSW 07/29/2015

## 2015-08-13 ENCOUNTER — Telehealth: Payer: Self-pay

## 2015-08-18 ENCOUNTER — Ambulatory Visit (INDEPENDENT_AMBULATORY_CARE_PROVIDER_SITE_OTHER): Payer: 59 | Admitting: Licensed Clinical Social Worker

## 2015-08-18 DIAGNOSIS — F332 Major depressive disorder, recurrent severe without psychotic features: Secondary | ICD-10-CM | POA: Diagnosis not present

## 2015-08-18 DIAGNOSIS — F4310 Post-traumatic stress disorder, unspecified: Secondary | ICD-10-CM
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
## 2015-08-25 ENCOUNTER — Ambulatory Visit: Payer: Self-pay | Admitting: Licensed Clinical Social Worker

## 2015-08-28 ENCOUNTER — Ambulatory Visit: Payer: Self-pay | Admitting: Licensed Clinical Social Worker

## 2015-08-28 ENCOUNTER — Other Ambulatory Visit: Payer: Self-pay | Admitting: Family Medicine

## 2015-09-01 ENCOUNTER — Ambulatory Visit (INDEPENDENT_AMBULATORY_CARE_PROVIDER_SITE_OTHER): Payer: 59 | Admitting: Licensed Clinical Social Worker

## 2015-09-08 ENCOUNTER — Ambulatory Visit (INDEPENDENT_AMBULATORY_CARE_PROVIDER_SITE_OTHER): Payer: 59 | Admitting: Licensed Clinical Social Worker

## 2015-09-08 DIAGNOSIS — F331 Major depressive disorder, recurrent, moderate: Secondary | ICD-10-CM

## 2015-09-08 DIAGNOSIS — F4310 Post-traumatic stress disorder, unspecified: Secondary | ICD-10-CM
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
## 2015-09-15 ENCOUNTER — Ambulatory Visit (INDEPENDENT_AMBULATORY_CARE_PROVIDER_SITE_OTHER): Payer: 59 | Admitting: Licensed Clinical Social Worker

## 2015-09-15 DIAGNOSIS — F331 Major depressive disorder, recurrent, moderate: Secondary | ICD-10-CM

## 2015-09-15 DIAGNOSIS — F4310 Post-traumatic stress disorder, unspecified: Secondary | ICD-10-CM | POA: Diagnosis not present
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
## 2015-09-22 ENCOUNTER — Ambulatory Visit (INDEPENDENT_AMBULATORY_CARE_PROVIDER_SITE_OTHER): Payer: 59 | Admitting: Licensed Clinical Social Worker

## 2015-09-22 DIAGNOSIS — F331 Major depressive disorder, recurrent, moderate: Secondary | ICD-10-CM

## 2015-09-22 DIAGNOSIS — F4310 Post-traumatic stress disorder, unspecified: Secondary | ICD-10-CM

## 2015-09-22 NOTE — Progress Notes (Signed)
THERAPIST PROGRESS NOTE  Session Time: 10:20 a.m. - 11:40 a.m.  Participation Level: Active  Behavioral Response: CasualAlertAnxious and Depressed  Type of Therapy: Individual Therapy  Treatment Goals addressed: Anxiety, Communication: Assertiveness/Boundary setting and Coping  Interventions: Solution Focused, Strength-based, Supportive and Reframing  Summary: Elizabeth Wyatt is a 59 y.o. female who returns to OPT to address the impact of marital separation on she and son's life and on her sense of security and sense of worth.  Estranged spouse sent her pictures from his trip to [place] and even texted her while she was in session with LCSW to notify her that he was flying into [place].  She voiced shock that he has kept in contact with her yet admits that she does not believe that his efforts are trust-worthy and remains suspicious of his motives in terms of his re-appearance in their lives and other things that he has done around the home and a few additional financial extras provided.  She has consulted an attorney in the past who seemed positive that their demands could be managed in a mediation versus going to court.  Appropriate concern voiced that spouse living outside of the home for the past two years is her word over his.  Ongoing anxiety around whether or not the spouse will try to put client and their son out of the house because the only option that they have is an older home that was willed to her by her late mother.  The home is in need of multiple house repairs and is not inhabitable in it's current state.  She and son continue to do much of the same each day which involves watching movies and television shows.  No recent physical or verbal aggression between she and son yet [name] voiced ongoing frustration around son's occasional talking back to her and refusing to do his chores. [name] voiced ongoing consideration to previous conversations between she and LCSW in terms of wanting and  expecting her son to stand up to his dad as something that the son may not be prepared to do emotionally right now.    [name] has made efforts to take all of her medications since our last session and has upcoming medical appointments.  Future interests and increasing investment in she and son's future was discussed.    Appropriate loneliness and feeling socially isolated given no living relatives in the area and few close friends.  "Now do you see why I'm saying that I'm having to pay you to be my friend?"  Client with belief that she is doing okay overall and better to some degree compared to when she first started OPT.  "I'm just through."  She was referring to the role of coming to accept that status of her marriage and understanding that she cannot pray for spouse to be better or different or fix him.  Appropriate grieving and sadness expressed.  [name] expressed upset and disappointment with LCSW regarding resignation and last day of 10/16/15.  She voiced awareness that she will have other OPT provider options and accepted letter from [facility] that outlines these options.  [name] informed LCSW that she already has therapy session scheduled with LCSW for next two weeks and will inform LCSW and [facility] of her decision regarding ongoing OPT.  "I'm not going to tell [name] this until we come back next time. I knew after the first visit that I was going to get along with you and now your leaving."   Suicidal/Homicidal:  Negativewithout intent/plan  Therapist Response:   Assessed for immediate risks of danger/threat to client and her son.  Commended client on use of firm boundaries including setting parameters for when they will allow Mr. Volland to visit and when he cannot.  Normalized her thoughts and feelings as part of the grieving and re-investing process.  Cautioned client to be aware of critical and negative self talk which can further create a belief of learned helplessness, hopelessness and worthlessness.   Provided ongoing therapy with insight and psycho-education on adapting to and mourning this type of loss and importance of self care when dealing with the resulting behaviors of the one who has left.  Normalized the array of emotions being experienced as a natural part of the letting go process.    Reassured client that should she and son find themselves with certain financial needs that there are community resources that she and son may qualify for and encouraged to contact LCSW should needs arise.  LCSW informed client that therapist's last day will be 10/16/15, and offered her a letter outlining this information as well as listing other OPT providers in the area.  Commended client on allowing herself to be open to the OPT process and validated the pain and struggles experienced/experiencing.  Thanked her for being courageous and allowing LCSW to join her and her son on this journey towards self discovery and recovery.  Welcomed client to return to OPT with LCSW at least once prior to LCSW's last day.  Plan: Return again weekly for a few sessions and LCSW will continue to evaluate symptoms and coordinate client's care with medical providers PRN.  [name] will establish care with [name] Section on 10/05/25, at 9:30 a.m.  She will contact this office and/or 911 if there are concerns about she & son's safety.  Will assist client with transfer to another OPT provider of her choice.  Diagnosis: Major Depressive Disorder, Recurrent, Severe, Without Psychosis   PTSD    [name], LCSW 09/22/2015

## 2015-09-29 ENCOUNTER — Ambulatory Visit (INDEPENDENT_AMBULATORY_CARE_PROVIDER_SITE_OTHER): Payer: Managed Care, Other (non HMO) | Admitting: Licensed Clinical Social Worker

## 2015-09-29 DIAGNOSIS — F4310 Post-traumatic stress disorder, unspecified: Secondary | ICD-10-CM

## 2015-09-29 DIAGNOSIS — F331 Major depressive disorder, recurrent, moderate: Secondary | ICD-10-CM | POA: Diagnosis not present
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
## 2015-10-06 ENCOUNTER — Ambulatory Visit (INDEPENDENT_AMBULATORY_CARE_PROVIDER_SITE_OTHER): Payer: 59 | Admitting: Licensed Clinical Social Worker

## 2015-10-06 DIAGNOSIS — F332 Major depressive disorder, recurrent severe without psychotic features: Secondary | ICD-10-CM | POA: Diagnosis not present

## 2015-10-06 DIAGNOSIS — F4310 Post-traumatic stress disorder, unspecified: Secondary | ICD-10-CM | POA: Diagnosis not present
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
## 2015-10-09 ENCOUNTER — Other Ambulatory Visit: Payer: Self-pay | Admitting: Family Medicine

## 2015-10-15 ENCOUNTER — Ambulatory Visit (INDEPENDENT_AMBULATORY_CARE_PROVIDER_SITE_OTHER): Payer: 59 | Admitting: Licensed Clinical Social Worker

## 2015-10-15 DIAGNOSIS — F332 Major depressive disorder, recurrent severe without psychotic features: Secondary | ICD-10-CM

## 2015-10-15 DIAGNOSIS — F4310 Post-traumatic stress disorder, unspecified: Secondary | ICD-10-CM
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
## 2015-10-23 ENCOUNTER — Ambulatory Visit (INDEPENDENT_AMBULATORY_CARE_PROVIDER_SITE_OTHER): Payer: Commercial Indemnity | Admitting: Family Medicine

## 2015-10-23 ENCOUNTER — Encounter: Payer: Self-pay | Admitting: Family Medicine

## 2015-10-23 VITALS — BP 120/75 | HR 79 | Resp 16 | Ht 62.0 in | Wt 264.8 lb

## 2015-10-23 DIAGNOSIS — G43509 Persistent migraine aura without cerebral infarction, not intractable, without status migrainosus: Secondary | ICD-10-CM | POA: Diagnosis not present

## 2015-10-23 DIAGNOSIS — F4310 Post-traumatic stress disorder, unspecified: Secondary | ICD-10-CM | POA: Diagnosis not present

## 2015-10-23 DIAGNOSIS — F331 Major depressive disorder, recurrent, moderate: Secondary | ICD-10-CM

## 2015-10-23 DIAGNOSIS — I10 Essential (primary) hypertension: Secondary | ICD-10-CM | POA: Diagnosis not present

## 2015-10-23 DIAGNOSIS — E785 Hyperlipidemia, unspecified: Secondary | ICD-10-CM | POA: Diagnosis not present

## 2015-10-23 DIAGNOSIS — I2510 Atherosclerotic heart disease of native coronary artery without angina pectoris: Secondary | ICD-10-CM | POA: Diagnosis not present

## 2015-10-23 MED ORDER — NEBIVOLOL HCL 10 MG PO TABS: 10.0000 mg | ORAL_TABLET | Freq: Every day | ORAL | Status: DC

## 2015-10-23 MED ORDER — VERAPAMIL HCL ER 240 MG PO CP24: 240.0000 mg | ORAL_CAPSULE | Freq: Every day | ORAL | Status: DC

## 2015-10-23 NOTE — Patient Instructions (Signed)
To get flu shot next week with her son.
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
## 2015-10-27 ENCOUNTER — Ambulatory Visit (INDEPENDENT_AMBULATORY_CARE_PROVIDER_SITE_OTHER): Payer: Commercial Indemnity

## 2015-10-27 DIAGNOSIS — Z23 Encounter for immunization: Secondary | ICD-10-CM

## 2015-10-28 ENCOUNTER — Telehealth: Payer: Self-pay | Admitting: Family Medicine

## 2015-10-28 ENCOUNTER — Other Ambulatory Visit: Payer: Self-pay | Admitting: Family Medicine

## 2015-10-28 LAB — CBC WITH DIFFERENTIAL/PLATELET
Basophils Absolute: 0 10*3/uL (ref 0.0–0.2)
Basos: 1 %
EOS (ABSOLUTE): 0.1 10*3/uL (ref 0.0–0.4)
Eos: 2 %
Hematocrit: 42.4 % (ref 34.0–46.6)
Hemoglobin: 13.9 g/dL (ref 11.1–15.9)
Immature Grans (Abs): 0 10*3/uL (ref 0.0–0.1)
Immature Granulocytes: 0 %
LYMPHS ABS: 1.5 10*3/uL (ref 0.7–3.1)
Lymphs: 31 %
MCH: 29.5 pg (ref 26.6–33.0)
MCHC: 32.8 g/dL (ref 31.5–35.7)
MCV: 90 fL (ref 79–97)
MONOCYTES: 8 %
MONOS ABS: 0.4 10*3/uL (ref 0.1–0.9)
NEUTROS PCT: 58 %
Neutrophils Absolute: 2.8 10*3/uL (ref 1.4–7.0)
Platelets: 273 10*3/uL (ref 150–379)
RBC: 4.71 x10E6/uL (ref 3.77–5.28)
RDW: 13.4 % (ref 12.3–15.4)
WBC: 4.8 10*3/uL (ref 3.4–10.8)

## 2015-10-28 LAB — COMPREHENSIVE METABOLIC PANEL
ALT: 17 IU/L (ref 0–32)
AST: 24 IU/L (ref 0–40)
Albumin/Globulin Ratio: 1.8 (ref 1.1–2.5)
Albumin: 4.2 g/dL (ref 3.5–5.5)
Alkaline Phosphatase: 80 IU/L (ref 39–117)
BILIRUBIN TOTAL: 0.3 mg/dL (ref 0.0–1.2)
BUN / CREAT RATIO: 23 (ref 9–23)
BUN: 22 mg/dL (ref 6–24)
CHLORIDE: 97 mmol/L (ref 97–106)
CO2: 25 mmol/L (ref 18–29)
Calcium: 9.4 mg/dL (ref 8.7–10.2)
Creatinine, Ser: 0.95 mg/dL (ref 0.57–1.00)
GFR, EST AFRICAN AMERICAN: 76 mL/min/{1.73_m2} (ref >59)
GFR, EST NON AFRICAN AMERICAN: 66 mL/min/{1.73_m2} (ref >59)
GLUCOSE: 92 mg/dL (ref 65–99)
Globulin, Total: 2.3 g/dL (ref 1.5–4.5)
POTASSIUM: 4.3 mmol/L (ref 3.5–5.2)
Sodium: 136 mmol/L (ref 136–144)
Total Protein: 6.5 g/dL (ref 6.0–8.5)

## 2015-10-28 LAB — TSH: TSH: 6.58 u[IU]/mL — AB (ref 0.450–4.500)

## 2015-10-28 LAB — LIPID PANEL
Chol/HDL Ratio: 2.7 ratio units (ref 0.0–4.4)
Cholesterol, Total: 245 mg/dL — ABNORMAL HIGH (ref 100–199)
HDL: 90 mg/dL (ref >39)
LDL Calculated: 126 mg/dL — ABNORMAL HIGH (ref 0–99)
Triglycerides: 144 mg/dL (ref 0–149)
VLDL CHOLESTEROL CAL: 29 mg/dL (ref 5–40)

## 2015-10-28 MED ORDER — LEVOTHYROXINE SODIUM 25 MCG PO TABS: 25.0000 ug | ORAL_TABLET | Freq: Every day | ORAL | Status: DC

## 2015-11-10 ENCOUNTER — Other Ambulatory Visit: Payer: Self-pay | Admitting: Family Medicine

## 2015-11-10 ENCOUNTER — Telehealth: Payer: Self-pay | Admitting: Family Medicine

## 2015-11-10 DIAGNOSIS — M797 Fibromyalgia: Secondary | ICD-10-CM

## 2015-11-10 DIAGNOSIS — I10 Essential (primary) hypertension: Secondary | ICD-10-CM

## 2015-11-10 MED ORDER — DULOXETINE HCL 30 MG PO CPEP: 90.0000 mg | ORAL_CAPSULE | Freq: Every day | ORAL | Status: DC

## 2015-11-10 MED ORDER — NEBIVOLOL HCL 10 MG PO TABS: 10.0000 mg | ORAL_TABLET | Freq: Every day | ORAL | Status: DC

## 2015-11-10 NOTE — Telephone Encounter (Signed)
I have reordered her Cymbalta and Bystolic.  It is ok to refer her to pain management for her fibromyalgia.  Call her and see what she wants to know about Levothyroxine, 25 mcg.-jh

## 2015-11-10 NOTE — Telephone Encounter (Signed)
Referral submitted patient aware.[place]

## 2015-11-10 NOTE — Telephone Encounter (Signed)
Pt called requesting a refill on  Bystolic 10 mg, cymbulta 30mg ,  [facility] [place], she also have  Requested  Referral to pain  Management ,  Also  Would like a nurse to call  Her  Back    About the  Med  levothroid  25 mg pt call back  # is  [phone]

## 2015-11-26 ENCOUNTER — Telehealth: Payer: Self-pay | Admitting: Family Medicine

## 2015-11-26 NOTE — Telephone Encounter (Signed)
[facility]

## 2015-11-26 NOTE — Telephone Encounter (Signed)
This was submitted on 10/28/15 #30/3 refills.

## 2015-11-26 NOTE — Telephone Encounter (Signed)
Called [facility] and was informed there are 3 refills on encounter. Asked that one be filled and patient contacted.[place]

## 2015-11-27 ENCOUNTER — Other Ambulatory Visit: Payer: Self-pay | Admitting: Family Medicine

## 2015-12-02 ENCOUNTER — Encounter: Payer: Self-pay | Admitting: *Deleted

## 2015-12-22 ENCOUNTER — Ambulatory Visit: Payer: Self-pay | Admitting: Family Medicine

## 2016-01-07 ENCOUNTER — Telehealth: Payer: Self-pay | Admitting: *Deleted

## 2016-01-07 ENCOUNTER — Encounter: Payer: Self-pay | Admitting: Family Medicine

## 2016-01-07 ENCOUNTER — Ambulatory Visit (INDEPENDENT_AMBULATORY_CARE_PROVIDER_SITE_OTHER): Payer: Commercial Indemnity | Admitting: Family Medicine

## 2016-01-07 VITALS — BP 110/78 | HR 85 | Temp 98.2°F | Resp 16 | Ht 62.0 in | Wt 265.0 lb

## 2016-01-07 DIAGNOSIS — E034 Atrophy of thyroid (acquired): Secondary | ICD-10-CM

## 2016-01-07 DIAGNOSIS — E038 Other specified hypothyroidism: Secondary | ICD-10-CM

## 2016-01-07 DIAGNOSIS — B354 Tinea corporis: Secondary | ICD-10-CM

## 2016-01-07 DIAGNOSIS — R0789 Other chest pain: Secondary | ICD-10-CM | POA: Diagnosis not present

## 2016-01-07 DIAGNOSIS — E039 Hypothyroidism, unspecified: Secondary | ICD-10-CM | POA: Insufficient documentation

## 2016-01-07 MED ORDER — NYSTATIN 100000 UNIT/GM EX POWD: CUTANEOUS | Status: DC

## 2016-01-07 NOTE — Progress Notes (Signed)
Name: Elizabeth Wyatt   MRN: [id number]    DOB: 10/12/1956   Date:01/07/2016       Progress Note  Subjective  Chief Complaint  Chief Complaint  Patient presents with  . knot in armpit  . Chest Pain  . Hypothyroidism    HPI Here c/o "knots under her arms".  Present x several mos.  Getting worse.  Has fungal skin rash.  C/o chest tightness and SOB with activity increasing recently.  Needs thyroid checked.  No problem-specific assessment & plan notes found for this encounter.   Past Medical History  Diagnosis Date  . HLD (hyperlipidemia)   . HTN (hypertension)   . Asthma   . Obesity   . Allergy   . Personal history of tobacco use, presenting hazards to health   . Special screening for malignant neoplasms, colon   . TIA (transient ischemic attack)   . CAD (coronary artery disease)   . Personal history of colonic polyps   . Family history of malignant neoplasm of breast   . Broken rib   . Osteoarthritis     Past Surgical History  Procedure Laterality Date  . Mouth surgery    . Dilation and curettage of uterus  2012  . Breast biopsy Right 2009  . Breast mass excision Right 2009  . Mouth surgery    . Colonoscopy  2008, 2014    [facility]  . Uterine fibroid surgery  2012  . Cardiac catheterization  2010    Dr. Mariah Milling with [facility]    Family History  Problem Relation Age of Onset  . Cancer Maternal Grandmother     cervical and uterine cancer  . Cancer Mother     breast, ovarian/uterine    Social History   Social History  . Marital Status: Married    Spouse Name: N/A  . Number of Children: N/A  . Years of Education: N/A   Occupational History  . Not on file.   Social History Main Topics  . Smoking status: Former Smoker -- 3.00 packs/day for 3 years    Types: Cigarettes  . Smokeless tobacco: Never Used     Comment: tobacco use - no   . Alcohol Use: No     Comment: quit drinking years ago, used to drink on weekends  . Drug Use: No  . Sexual Activity: Not  on file   Other Topics Concern  . Not on file   Social History Narrative   Married, does not get regular exercise.      Current outpatient prescriptions:  .  aspirin 81 MG tablet, Take 81 mg by mouth daily., Disp: , Rfl:  .  cetirizine (ZYRTEC) 10 MG tablet, Take 10 mg by mouth as needed.  , Disp: , Rfl:  .  DULoxetine (CYMBALTA) 30 MG capsule, Take 3 capsules (90 mg total) by mouth daily., Disp: 90 capsule, Rfl: 6 .  etodolac (LODINE) 400 MG tablet, TAKE 1 TABLET (400 MG TOTAL) BY MOUTH 2 (TWO) TIMES DAILY., Disp: , Rfl:  .  FLOVENT HFA 110 MCG/ACT inhaler, IHALE 2 PUFFS BY MOUTH TWICE A DAY, Disp: 12 Inhaler, Rfl: 6 .  levothyroxine (SYNTHROID, LEVOTHROID) 25 MCG tablet, Take 1 tablet (25 mcg total) by mouth daily before breakfast., Disp: 30 tablet, Rfl: 3 .  nebivolol (BYSTOLIC) 10 MG tablet, Take 1 tablet (10 mg total) by mouth daily., Disp: 90 tablet, Rfl: 3 .  nitroGLYCERIN (NITROSTAT) 0.4 MG SL tablet, Place 0.4 mg under the tongue every  5 (five) minutes as needed., Disp: , Rfl:  .  omeprazole (PRILOSEC) 20 MG capsule, Take 20 mg by mouth daily.  , Disp: , Rfl:  .  PRILOSEC OTC 20 MG tablet, TAKE ONE TABLET BY MOUTH ONCE DAILY, Disp: 30 tablet, Rfl: 12 .  PROAIR HFA 108 (90 BASE) MCG/ACT inhaler, 2 PUFFS PUFF, INHALATION, EVERY 4 HRS AS NEEDED, Disp: 8.5 Inhaler, Rfl: 6 .  topiramate (TOPAMAX) 50 MG tablet, TAKE 2 TABLETS BY MOUTH AT BEDTIME, Disp: 60 tablet, Rfl: 9 .  verapamil (VERELAN PM) 240 MG 24 hr capsule, Take 1 capsule (240 mg total) by mouth daily., Disp: 90 capsule, Rfl: 3 .  nystatin (MYCOSTATIN/NYSTOP) 100000 UNIT/GM POWD, One application twice a day to rash, Disp: 1 Bottle, Rfl: 12  Allergies  Allergen Reactions  . Diphenhydramine Hcl     Increased heart rate  . Latex Other (See Comments)    Blisters,then peels skin  . Montelukast Sodium Other (See Comments)    Jittery  . Pamabrom     This medication is in midol and caused anuria  . Simvastatin Other (See  Comments)    dizziness  . Sulfonamide Derivatives     vomiting  . Wellbutrin [Bupropion]     Caused major depression and SUICIDAL IDEATION.     Review of Systems  Constitutional: Positive for malaise/fatigue. Negative for fever, chills and weight loss.  HENT: Negative for hearing loss.   Eyes: Negative for blurred vision and double vision.  Respiratory: Positive for shortness of breath. Negative for cough and wheezing.   Cardiovascular: Positive for chest pain. Negative for leg swelling.  Gastrointestinal: Negative for heartburn, abdominal pain and blood in stool.  Genitourinary: Negative for dysuria, urgency and frequency.  Skin: Negative for itching and rash.       Knots under arm pits.  Neurological: Negative for weakness and headaches.      Objective  Filed Vitals:   01/07/16 1358  BP: 110/78  Pulse: 85  Temp: 98.2 F (36.8 C)  TempSrc: Oral  Resp: 16  Height:  (1.575 m)  Weight: 265 lb (120.203 kg)  SpO2: 96%    Physical Exam  Constitutional: She is oriented to person, place, and time and well-developed, well-nourished, and in no distress. No distress.  HENT:  Head: Normocephalic and atraumatic.  Eyes: Conjunctivae and EOM are normal. Pupils are equal, round, and reactive to light. No scleral icterus.  Neck: Normal range of motion. Neck supple. No thyromegaly present.  Cardiovascular: Normal rate, regular rhythm and normal heart sounds.  Exam reveals no gallop and no friction rub.   No murmur heard. Pulmonary/Chest: Effort normal and breath sounds normal. No respiratory distress. She has no wheezes. She has no rales.  Abdominal: She exhibits no distension and no mass. There is no tenderness.  Musculoskeletal: She exhibits no edema.  Lymphadenopathy:    She has no cervical adenopathy.  Neurological: She is alert and oriented to person, place, and time.  Skin:  Fungal skin rash in bilateral axillae.  Vitals reviewed.      Recent Results (from the  past 2160 hour(s))  Comprehensive Metabolic Panel (CMET)     Status: None   Collection Time: 10/27/15 11:47 AM  Result Value Ref Range   Glucose 92 65 - 99 mg/dL   BUN 22 6 - 24 mg/dL   Creatinine, Ser 1.61 0.57 - 1.00 mg/dL   GFR calc non Af Amer 66 >59 mL/min/1.73   GFR calc Af Amer 76 >  59 mL/min/1.73   BUN/Creatinine Ratio 23 9 - 23   Sodium 136 136 - 144 mmol/L   Potassium 4.3 3.5 - 5.2 mmol/L   Chloride 97 97 - 106 mmol/L   CO2 25 18 - 29 mmol/L   Calcium 9.4 8.7 - 10.2 mg/dL   Total Protein 6.5 6.0 - 8.5 g/dL   Albumin 4.2 3.5 - 5.5 g/dL   Globulin, Total 2.3 1.5 - 4.5 g/dL   Albumin/Globulin Ratio 1.8 1.1 - 2.5   Bilirubin Total 0.3 0.0 - 1.2 mg/dL   Alkaline Phosphatase 80 39 - 117 IU/L   AST 24 0 - 40 IU/L   ALT 17 0 - 32 IU/L  Lipid Profile     Status: Abnormal   Collection Time: 10/27/15 11:47 AM  Result Value Ref Range   Cholesterol, Total 245 (H) 100 - 199 mg/dL   Triglycerides 981 0 - 149 mg/dL   HDL 90 >19 mg/dL    Comment: According to ATP-III Guidelines, HDL-C >59 mg/dL is considered a negative risk factor for CHD.    VLDL Cholesterol Cal 29 5 - 40 mg/dL   LDL Calculated 147 (H) 0 - 99 mg/dL   Chol/HDL Ratio 2.7 0.0 - 4.4 ratio units    Comment:                                   T. Chol/HDL Ratio                                             Men  Women                               1/2 Avg.Risk  3.4    3.3                                   Avg.Risk  5.0    4.4                                2X Avg.Risk  9.6    7.1                                3X Avg.Risk 23.4   11.0   CBC with Differential     Status: None   Collection Time: 10/27/15 11:47 AM  Result Value Ref Range   WBC 4.8 3.4 - 10.8 x10E3/uL   RBC 4.71 3.77 - 5.28 x10E6/uL   Hemoglobin 13.9 11.1 - 15.9 g/dL   Hematocrit 82.9 56.2 - 46.6 %   MCV 90 79 - 97 fL   MCH 29.5 26.6 - 33.0 pg   MCHC 32.8 31.5 - 35.7 g/dL   RDW 13.0 86.5 - 78.4 %   Platelets 273 150 - 379 x10E3/uL   Neutrophils 58 %    Lymphs 31 %   Monocytes 8 %   Eos 2 %   Basos 1 %   Neutrophils Absolute 2.8 1.4 - 7.0 x10E3/uL   Lymphocytes Absolute 1.5 0.7 - 3.1 x10E3/uL   Monocytes Absolute 0.4  0.1 - 0.9 x10E3/uL   EOS (ABSOLUTE) 0.1 0.0 - 0.4 x10E3/uL   Basophils Absolute 0.0 0.0 - 0.2 x10E3/uL   Immature Granulocytes 0 %   Immature Grans (Abs) 0.0 0.0 - 0.1 x10E3/uL  TSH     Status: Abnormal   Collection Time: 10/27/15 11:47 AM  Result Value Ref Range   TSH 6.580 (H) 0.450 - 4.500 uIU/mL     Assessment & Plan  Problem List Items Addressed This Visit      Endocrine   Hypothyroidism   Relevant Orders   TSH     Musculoskeletal and Integument   Tinea corporis   Relevant Medications   nystatin (MYCOSTATIN/NYSTOP) 100000 UNIT/GM POWD    Other Visit Diagnoses    Chest tightness    -  Primary    Relevant Orders    PR ELECTROCARDIOGRAM, COMPLETE    Ambulatory referral to Cardiology       Meds ordered this encounter  Medications  . nystatin (MYCOSTATIN/NYSTOP) 100000 UNIT/GM POWD    Sig: One application twice a day to rash    Dispense:  1 Bottle    Refill:  12   1. Chest tightness  - PR ELECTROCARDIOGRAM, COMPLETE - Ambulatory referral to Cardiology  2. Tinea corporis  - nystatin (MYCOSTATIN/NYSTOP) 100000 UNIT/GM POWD; One application twice a day to rash  Dispense: 1 Bottle; Refill: 12  3. Hypothyroidism due to acquired atrophy of thyroid Cont. levothyroxine - TSH

## 2016-01-07 NOTE — Telephone Encounter (Signed)
Feb 9th, 2017 at [facility].

## 2016-01-12 ENCOUNTER — Telehealth: Payer: Self-pay | Admitting: Family Medicine

## 2016-01-12 ENCOUNTER — Other Ambulatory Visit: Payer: Self-pay | Admitting: *Deleted

## 2016-01-12 DIAGNOSIS — R5383 Other fatigue: Secondary | ICD-10-CM

## 2016-01-12 NOTE — Telephone Encounter (Signed)
Gave appt detail re: location [facility] and cardiology both inside same building.

## 2016-01-12 NOTE — Telephone Encounter (Signed)
Patient asked about getting b-12 injections at last visit. I told patient that you probably would want to check her b-12 level. Please advise.

## 2016-01-12 NOTE — Telephone Encounter (Signed)
Pt asked for you to call her about an appt that she was referred to [phone]

## 2016-01-12 NOTE — Addendum Note (Signed)
Addended by: Alease Frame on: 01/12/2016 04:23 PM   Modules accepted: Orders

## 2016-01-12 NOTE — Telephone Encounter (Signed)
She would have to be documented B-12 deficient.  I would be glad to check B12 and folate on her, but if normal B12 not indicated.  Ok to draw if she wants.-jh

## 2016-01-14 NOTE — Telephone Encounter (Signed)
error 

## 2016-01-15 LAB — B12 AND FOLATE PANEL
FOLATE: 7.1 ng/mL (ref >3.0)
Vitamin B-12: 693 pg/mL (ref 211–946)

## 2016-01-15 LAB — TSH: TSH: 5.37 u[IU]/mL — ABNORMAL HIGH (ref 0.450–4.500)

## 2016-01-19 ENCOUNTER — Telehealth: Payer: Self-pay | Admitting: Family Medicine

## 2016-01-19 NOTE — Telephone Encounter (Signed)
Pt called wanting lab results. Pt call back #is  [phone]

## 2016-01-20 ENCOUNTER — Other Ambulatory Visit: Payer: Self-pay | Admitting: Family Medicine

## 2016-01-20 DIAGNOSIS — E039 Hypothyroidism, unspecified: Secondary | ICD-10-CM

## 2016-01-20 MED ORDER — LEVOTHYROXINE SODIUM 50 MCG PO TABS: 50.0000 ug | ORAL_TABLET | Freq: Every day | ORAL | Status: DC

## 2016-01-21 ENCOUNTER — Ambulatory Visit: Payer: Commercial Indemnity | Admitting: Anesthesiology

## 2016-01-22 ENCOUNTER — Other Ambulatory Visit: Payer: Self-pay | Admitting: Anesthesiology

## 2016-01-22 ENCOUNTER — Encounter: Payer: Self-pay | Admitting: Anesthesiology

## 2016-01-22 ENCOUNTER — Ambulatory Visit: Payer: 59 | Attending: Anesthesiology | Admitting: Anesthesiology

## 2016-01-22 VITALS — BP 130/70 | HR 76 | Temp 98.2°F | Resp 18 | Ht 62.0 in | Wt 265.0 lb

## 2016-01-22 DIAGNOSIS — Z87891 Personal history of nicotine dependence: Secondary | ICD-10-CM | POA: Diagnosis not present

## 2016-01-22 DIAGNOSIS — M19011 Primary osteoarthritis, right shoulder: Secondary | ICD-10-CM

## 2016-01-22 DIAGNOSIS — Z8673 Personal history of transient ischemic attack (TIA), and cerebral infarction without residual deficits: Secondary | ICD-10-CM | POA: Insufficient documentation

## 2016-01-22 DIAGNOSIS — M1990 Unspecified osteoarthritis, unspecified site: Secondary | ICD-10-CM | POA: Insufficient documentation

## 2016-01-22 DIAGNOSIS — J45909 Unspecified asthma, uncomplicated: Secondary | ICD-10-CM | POA: Diagnosis not present

## 2016-01-22 DIAGNOSIS — M797 Fibromyalgia: Secondary | ICD-10-CM

## 2016-01-22 DIAGNOSIS — E785 Hyperlipidemia, unspecified: Secondary | ICD-10-CM | POA: Insufficient documentation

## 2016-01-22 DIAGNOSIS — I10 Essential (primary) hypertension: Secondary | ICD-10-CM | POA: Insufficient documentation

## 2016-01-22 DIAGNOSIS — E669 Obesity, unspecified: Secondary | ICD-10-CM | POA: Insufficient documentation

## 2016-01-22 DIAGNOSIS — M5136 Other intervertebral disc degeneration, lumbar region: Secondary | ICD-10-CM | POA: Diagnosis not present

## 2016-01-22 DIAGNOSIS — M19012 Primary osteoarthritis, left shoulder: Secondary | ICD-10-CM | POA: Diagnosis not present

## 2016-01-22 DIAGNOSIS — M2550 Pain in unspecified joint: Secondary | ICD-10-CM | POA: Diagnosis present

## 2016-01-22 DIAGNOSIS — M19019 Primary osteoarthritis, unspecified shoulder: Secondary | ICD-10-CM

## 2016-01-22 DIAGNOSIS — M7918 Myalgia, other site: Secondary | ICD-10-CM

## 2016-01-22 DIAGNOSIS — G8929 Other chronic pain: Secondary | ICD-10-CM | POA: Insufficient documentation

## 2016-01-22 DIAGNOSIS — M1710 Unilateral primary osteoarthritis, unspecified knee: Secondary | ICD-10-CM | POA: Insufficient documentation

## 2016-01-22 DIAGNOSIS — M25519 Pain in unspecified shoulder: Secondary | ICD-10-CM | POA: Diagnosis present

## 2016-01-22 DIAGNOSIS — M179 Osteoarthritis of knee, unspecified: Secondary | ICD-10-CM | POA: Insufficient documentation

## 2016-01-22 DIAGNOSIS — I2510 Atherosclerotic heart disease of native coronary artery without angina pectoris: Secondary | ICD-10-CM | POA: Insufficient documentation

## 2016-01-22 DIAGNOSIS — M51369 Other intervertebral disc degeneration, lumbar region without mention of lumbar back pain or lower extremity pain: Secondary | ICD-10-CM | POA: Insufficient documentation

## 2016-01-22 MED ORDER — TRAMADOL HCL 50 MG PO TABS: 50.0000 mg | ORAL_TABLET | Freq: Two times a day (BID) | ORAL | Status: DC

## 2016-01-22 NOTE — Progress Notes (Signed)
Safety precautions to be maintained throughout the outpatient stay will include: orient to surroundings, keep bed in low position, maintain call bell within reach at all times, provide assistance with transfer out of bed and ambulation.  

## 2016-01-22 NOTE — Patient Instructions (Signed)
You were given a prescription for Tramadol today. 

## 2016-01-25 NOTE — Progress Notes (Signed)
Subjective:  Patient ID: Elizabeth Wyatt, female    DOB: [birth year]  Age: 60 y.o. MRN: [id number]  CC: Shoulder Pain and Joint Pain   HPI Elizabeth Wyatt presents for Dr. Juanell Fairly. She is a pleasant exterior-year-old white female with chronic shoulder pain that she describes as being present for several months. He states that this is not work-related and has been described as a VAS maximum 10 best a 5 and averages about a 5. The pain is not influence by time of day but is worse with motion and the quality is described as constant toothache-like dull aching pain in the right shoulder worse with position changes. She states that this pain wakes her up at night and does not let her sleep. She has been seen by Dr. Martha Clan performed an MRI of the right shoulder showing evidence of a tear which could ward surgical intervention. However the patient had a stroke a few months ago and for that reason is considered a nonsurgical candidate at this time. She also has some left shoulder pain and diffuse multi-joint degenerative arthritis in addition to some intermittent low back pain. She states she's had different medications to help with pain and most effectively has been tramadol. She's tried hydrocodone in the past but these reportedly did not sit well with her. She denies numbness or tingling affecting the right hand for associated weakness in the right arm.  History Elizabeth Wyatt has a past medical history of HLD (hyperlipidemia); HTN (hypertension); Asthma; Obesity; Allergy; Personal history of tobacco use, presenting hazards to health; Special screening for malignant neoplasms, colon; TIA (transient ischemic attack); CAD (coronary artery disease); Personal history of colonic polyps; Family history of malignant neoplasm of breast; Broken rib; and Osteoarthritis.   She has past surgical history that includes Mouth surgery; Dilation and curettage of uterus (2012); Breast biopsy (Right, 2009); Breast mass  excision (Right, 2009); Mouth surgery; Colonoscopy (2008, 2014); Uterine fibroid surgery (2012); and Cardiac catheterization (2010).   Her family history includes Cancer in her maternal grandmother and mother.She reports that she has quit smoking. Her smoking use included Cigarettes. She has a 9 pack-year smoking history. She has never used smokeless tobacco. She reports that she does not drink alcohol or use illicit drugs.   ---------------------------------------------------------------------------------------------------------------------- Past Medical History  Diagnosis Date  . HLD (hyperlipidemia)   . HTN (hypertension)   . Asthma   . Obesity   . Allergy   . Personal history of tobacco use, presenting hazards to health   . Special screening for malignant neoplasms, colon   . TIA (transient ischemic attack)   . CAD (coronary artery disease)   . Personal history of colonic polyps   . Family history of malignant neoplasm of breast   . Broken rib   . Osteoarthritis     Past Surgical History  Procedure Laterality Date  . Mouth surgery    . Dilation and curettage of uterus  2012  . Breast biopsy Right 2009  . Breast mass excision Right 2009  . Mouth surgery    . Colonoscopy  2008, 2014    [facility]  . Uterine fibroid surgery  2012  . Cardiac catheterization  2010    Dr. Mariah Milling with [facility]    Family History  Problem Relation Age of Onset  . Cancer Maternal Grandmother     cervical and uterine cancer  . Cancer Mother     breast, ovarian/uterine    Social History  Substance Use Topics  .  Smoking status: Former Smoker -- 3.00 packs/day for 3 years    Types: Cigarettes  . Smokeless tobacco: Never Used     Comment: tobacco use - no   . Alcohol Use: No     Comment: quit drinking years ago, used to drink on weekends    ---------------------------------------------------------------------------------------------------------------------- Social History   Social History  .  Marital Status: Married    Spouse Name: N/A  . Number of Children: N/A  . Years of Education: N/A   Social History Main Topics  . Smoking status: Former Smoker -- 3.00 packs/day for 3 years    Types: Cigarettes  . Smokeless tobacco: Never Used     Comment: tobacco use - no   . Alcohol Use: No     Comment: quit drinking years ago, used to drink on weekends  . Drug Use: No  . Sexual Activity: Not Asked   Other Topics Concern  . None   Social History Narrative   Married, does not get regular exercise.       ----------------------------------------------------------------------------------------------------------------------  ROS Review of Systems     Objective:  BP 130/70 mmHg  Pulse 76  Temp(Src) 98.2 F (36.8 C) (Oral)  Resp 18  Ht  (1.575 m)  Wt 265 lb (120.203 kg)  BMI 48.46 kg/m2  SpO2 99%  Physical Exam   Pupils are equally round and reactive to light extraocular muscles intact Heart is regular rate and rhythm without murmur Lungs are clear to auscultation Inspection of the right shoulder reveals some pain with motion at the glenohumeral joint with both internal and external rotation abduction and abduction. She has significant pain extending the arm but her strength appears to be intact as is bicep tricep region and grass and wrist flexors and extensors. Sensation is intact to light touch. There is a trigger points present in the mid and lateral portion of the right trapezius but does significantly exacerbate her pain in the right shoulder. Low back evaluation reveals pain on extension with left and right lateral rotation in seated position. This is also present in the standing position. Her leg strength appears to be grossly intact     Assessment & Plan:   Elizabeth Wyatt was seen today for shoulder pain and joint pain.  Diagnoses and all orders for this visit:  DDD (degenerative disc disease), lumbar  Primary osteoarthritis of both shoulders  Muscle  pain, myofacial  Other orders -     traMADol (ULTRAM) 50 MG tablet; Take 1 tablet (50 mg total) by mouth 2 (two) times daily.     ----------------------------------------------------------------------------------------------------------------------  Problem List Items Addressed This Visit      Musculoskeletal and Integument   DDD (degenerative disc disease), lumbar - Primary   Relevant Medications   traMADol (ULTRAM) 50 MG tablet   Osteoarthritis, shoulder   Relevant Medications   traMADol (ULTRAM) 50 MG tablet   Muscle pain, myofacial      ----------------------------------------------------------------------------------------------------------------------  1. DDD (degenerative disc disease), lumbar We discussed options regarding possible epidural steroid injections for her low back pain and some of the facet genic characteristics on her clinical examination today.  2. Primary osteoarthritis of both shoulders She is to continue follow-up with Dr. Juanell Fairly. She has a partial rotator cuff tear on the right side and this had previous surgery on the left side. She does have a trigger point in the mid and lateral portion of the right trapezius on examination and we will plan on performing a trigger point injection  at her next visit  3. Muscle pain, myofacial As above    ----------------------------------------------------------------------------------------------------------------------  I am having Elizabeth Wyatt start on traMADol. I am also having her maintain her omeprazole, cetirizine, aspirin, nitroGLYCERIN, topiramate, PRILOSEC OTC, etodolac, verapamil, DULoxetine, nebivolol, FLOVENT HFA, PROAIR HFA, nystatin, and levothyroxine.   Meds ordered this encounter  Medications  . traMADol (ULTRAM) 50 MG tablet    Sig: Take 1 tablet (50 mg total) by mouth 2 (two) times daily.    Dispense:  60 tablet    Refill:  1       Follow-up: Return in about 1 month (around  02/19/2016) for evaluation, med refill. we'll plan on a trigger point injection at her next visit.   Yevette Edwards, MD

## 2016-01-26 ENCOUNTER — Other Ambulatory Visit: Payer: Self-pay | Admitting: Family Medicine

## 2016-01-29 LAB — TOXASSURE SELECT 13 (MW), URINE: PDF: 0

## 2016-02-01 ENCOUNTER — Telehealth: Payer: Self-pay | Admitting: *Deleted

## 2016-02-01 NOTE — Telephone Encounter (Signed)
Left message for patient to call the office back. She had a appointment on 02/02/16 but needs additional views at [facility]. Patient needs to get her additional views scheduled and then call us back to let us know the day so we can r/s her office visit with Dr.Sankar.

## 2016-02-02 ENCOUNTER — Ambulatory Visit: Payer: Managed Care, Other (non HMO) | Admitting: General Surgery

## 2016-02-03 ENCOUNTER — Other Ambulatory Visit: Payer: Self-pay | Admitting: Family Medicine

## 2016-02-03 DIAGNOSIS — R0789 Other chest pain: Secondary | ICD-10-CM

## 2016-02-12 ENCOUNTER — Encounter: Payer: Self-pay | Admitting: Cardiovascular Disease

## 2016-02-12 ENCOUNTER — Encounter (INDEPENDENT_AMBULATORY_CARE_PROVIDER_SITE_OTHER): Payer: Self-pay

## 2016-02-12 ENCOUNTER — Ambulatory Visit (INDEPENDENT_AMBULATORY_CARE_PROVIDER_SITE_OTHER): Payer: Managed Care, Other (non HMO) | Admitting: Cardiovascular Disease

## 2016-02-12 VITALS — BP 130/82 | HR 73 | Ht 62.0 in | Wt 268.0 lb

## 2016-02-12 DIAGNOSIS — I2510 Atherosclerotic heart disease of native coronary artery without angina pectoris: Secondary | ICD-10-CM

## 2016-02-12 DIAGNOSIS — R0789 Other chest pain: Secondary | ICD-10-CM | POA: Diagnosis not present

## 2016-02-12 DIAGNOSIS — M19011 Primary osteoarthritis, right shoulder: Secondary | ICD-10-CM

## 2016-02-12 DIAGNOSIS — E785 Hyperlipidemia, unspecified: Secondary | ICD-10-CM

## 2016-02-12 DIAGNOSIS — I10 Essential (primary) hypertension: Secondary | ICD-10-CM | POA: Diagnosis not present

## 2016-02-12 DIAGNOSIS — M19012 Primary osteoarthritis, left shoulder: Secondary | ICD-10-CM

## 2016-02-12 MED ORDER — ATORVASTATIN CALCIUM 10 MG PO TABS: 10.0000 mg | ORAL_TABLET | Freq: Every day | ORAL | Status: DC

## 2016-02-12 NOTE — Assessment & Plan Note (Signed)
Previous history of atypical chest pain  prior cardiac catheterization in 2009 with no significant CAD No further workup needed at this time

## 2016-02-12 NOTE — Progress Notes (Signed)
Patient ID: Elizabeth Wyatt, female    DOB: 1956-05-12, 60 y.o.   MRN: [id number]  HPI Comments: [name] is a 60 yo woman with PMH of morbid obesity, hypertension, hyperlipidemia, history of chronic chest pain with cardiac catheterization May 2009  showing no significant coronary artery disease (she does have a circumflex coming off the ostium of the RCA) Who presents for routine followup of her chest pain symptoms. She has significant stress at home, son who has autism. Separated from her husband  In follow-up today, she presents with her son Reports she is doing well, denies any significant chest pain on exertion. Has not had much contact with her previous husband No significant leg edema Reports that her blood pressure has been relatively well-controlled Reports having weight gain despite her efforts  EKG on today's visit shows normal sinus rhythm, no significant ST or T-wave changes   Other past medical history Stress test 04/2008:  Cardiac catheter at the same time    Allergies  Allergen Reactions  . Diphenhydramine Hcl     Increased heart rate  . Latex Other (See Comments)    Blisters,then peels skin  . Montelukast Sodium Other (See Comments)    Jittery  . Pamabrom     This medication is in midol and caused anuria  . Simvastatin Other (See Comments)    dizziness  . Sulfonamide Derivatives     vomiting  . Wellbutrin [Bupropion]     Caused major depression and SUICIDAL IDEATION.    Outpatient Encounter Prescriptions as of 02/12/2016  Medication Sig  . aspirin 81 MG tablet Take 81 mg by mouth daily.  . cetirizine (ZYRTEC) 10 MG tablet Take 10 mg by mouth as needed.    . DULoxetine (CYMBALTA) 30 MG capsule Take 3 capsules (90 mg total) by mouth daily.  Marland Kitchen etodolac (LODINE) 400 MG tablet TAKE 1 TABLET (400 MG TOTAL) BY MOUTH 2 (TWO) TIMES DAILY.  Marland Kitchen FLOVENT HFA 110 MCG/ACT inhaler IHALE 2 PUFFS BY MOUTH TWICE A DAY (Patient taking differently: IHALE 2 PUFFS BY MOUTH  AS NEEDED.)  . levothyroxine (SYNTHROID, LEVOTHROID) 50 MCG tablet Take 1 tablet (50 mcg total) by mouth daily.  . nebivolol (BYSTOLIC) 10 MG tablet Take 1 tablet (10 mg total) by mouth daily.  . nitroGLYCERIN (NITROSTAT) 0.4 MG SL tablet Place 0.4 mg under the tongue every 5 (five) minutes as needed.  . nystatin (MYCOSTATIN/NYSTOP) 100000 UNIT/GM POWD One application twice a day to rash  . PRILOSEC OTC 20 MG tablet TAKE ONE TABLET BY MOUTH ONCE DAILY  . PROAIR HFA 108 (90 BASE) MCG/ACT inhaler 2 PUFFS PUFF, INHALATION, EVERY 4 HRS AS NEEDED  . topiramate (TOPAMAX) 50 MG tablet TAKE 2 TABLETS BY MOUTH AT BEDTIME  . traMADol (ULTRAM) 50 MG tablet Take 1 tablet (50 mg total) by mouth 2 (two) times daily. (Patient taking differently: Take 50 mg by mouth as needed. )  . verapamil (VERELAN PM) 240 MG 24 hr capsule Take 1 capsule (240 mg total) by mouth daily.  Marland Kitchen atorvastatin (LIPITOR) 10 MG tablet Take 1 tablet (10 mg total) by mouth daily.  . [DISCONTINUED] omeprazole (PRILOSEC) 20 MG capsule TAKE 1 CAPSULE EVERY DAY (Patient not taking: Reported on 02/12/2016)   No facility-administered encounter medications on file as of 02/12/2016.    Past Medical History  Diagnosis Date  . HLD (hyperlipidemia)   . HTN (hypertension)   . Asthma   . Obesity   . Allergy   .  Personal history of tobacco use, presenting hazards to health   . Special screening for malignant neoplasms, colon   . TIA (transient ischemic attack)   . CAD (coronary artery disease)   . Personal history of colonic polyps   . Family history of malignant neoplasm of breast   . Broken rib   . Osteoarthritis     Past Surgical History  Procedure Laterality Date  . Mouth surgery    . Dilation and curettage of uterus  2012  . Breast biopsy Right 2009  . Breast mass excision Right 2009  . Mouth surgery    . Colonoscopy  2008, 2014    [facility]  . Uterine fibroid surgery  2012  . Cardiac catheterization  2010    Dr. Mariah MillingGollan with [facility]     Social History  reports that she has quit smoking. Her smoking use included Cigarettes. She has a 9 pack-year smoking history. She has never used smokeless tobacco. She reports that she does not drink alcohol or use illicit drugs.  Family History family history includes Cancer in her maternal grandmother and mother.      Review of Systems  Constitutional: Negative.   Respiratory: Negative.   Cardiovascular: Negative.   Gastrointestinal: Negative.   Musculoskeletal: Negative.   Skin: Negative.   Neurological: Negative.   Hematological: Negative.   Psychiatric/Behavioral: The patient is nervous/anxious.   All other systems reviewed and are negative.   BP 130/82 mmHg  Pulse 73  Ht 5\' 2"  (1.575 m)  Wt 268 lb (121.564 kg)  BMI 49.01 kg/m2  Physical Exam  Constitutional: She is oriented to person, place, and time. She appears well-developed and well-nourished.   obese  HENT:  Head: Normocephalic.  Nose: Nose normal.  Mouth/Throat: Oropharynx is clear and moist.  Eyes: Conjunctivae are normal. Pupils are equal, round, and reactive to light.  Neck: Normal range of motion. Neck supple. No JVD present.  Cardiovascular: Normal rate, regular rhythm, S1 normal, S2 normal and intact distal pulses.  Exam reveals no gallop and no friction rub.   Murmur heard. Pulmonary/Chest: Effort normal and breath sounds normal. No respiratory distress. She has no wheezes. She has no rales. She exhibits no tenderness.  Abdominal: Soft. Bowel sounds are normal. She exhibits no distension. There is no tenderness.  Musculoskeletal: Normal range of motion. She exhibits no edema or tenderness.  Lymphadenopathy:    She has no cervical adenopathy.  Neurological: She is alert and oriented to person, place, and time. Coordination normal.  Skin: Skin is warm and dry. No rash noted. No erythema.  Psychiatric: She has a normal mood and affect. Her behavior is normal. Judgment and thought content normal.     Assessment and Plan  Nursing note and vitals reviewed.

## 2016-02-12 NOTE — Assessment & Plan Note (Signed)
We have encouraged continued exercise, careful diet management in an effort to lose weight. 

## 2016-02-12 NOTE — Assessment & Plan Note (Signed)
She reports having diffuse arthritic pain, previously seen by the pain clinic, She does not want pain medication

## 2016-02-12 NOTE — Assessment & Plan Note (Signed)
Encouraged her to stay on her Lipitor 

## 2016-02-12 NOTE — Patient Instructions (Addendum)
You are doing well.  Please start lipitor 10 mg daily for cholesterol  Phone number for Dr. Yves Dillhasnis:  [phone]  Please call us if you have new issues that need to be addressed before your next appt.  Your physician wants you to follow-up in: 12 months.  You will receive a reminder letter in the mail two months in advance. If you don't receive a letter, please call our office to schedule the follow-up appointment.

## 2016-02-12 NOTE — Assessment & Plan Note (Signed)
Blood pressure is well controlled on today's visit. No changes made to the medications. 

## 2016-02-17 ENCOUNTER — Encounter: Payer: Self-pay | Admitting: General Surgery

## 2016-02-18 ENCOUNTER — Encounter: Payer: Commercial Indemnity | Admitting: Anesthesiology

## 2016-02-22 ENCOUNTER — Ambulatory Visit (INDEPENDENT_AMBULATORY_CARE_PROVIDER_SITE_OTHER): Payer: Managed Care, Other (non HMO) | Admitting: General Surgery

## 2016-02-22 ENCOUNTER — Encounter: Payer: Self-pay | Admitting: General Surgery

## 2016-02-22 VITALS — BP 124/78 | HR 78 | Resp 14 | Ht 64.0 in | Wt 274.0 lb

## 2016-02-22 DIAGNOSIS — Z86010 Personal history of colonic polyps: Secondary | ICD-10-CM | POA: Diagnosis not present

## 2016-02-22 DIAGNOSIS — Z803 Family history of malignant neoplasm of breast: Secondary | ICD-10-CM | POA: Diagnosis not present

## 2016-02-22 NOTE — Patient Instructions (Addendum)
The patient has been asked to return to the office in one year with a bilateral screening mammogram. Continue self breast exams. Call office for any new breast issues or concerns.  

## 2016-02-22 NOTE — Progress Notes (Signed)
Patient ID: Elizabeth Wyatt, female   DOB: Dec 23, 1955, 60 y.o.   MRN: [id number]  Chief Complaint  Patient presents with  . Follow-up    mammogram    HPI Elizabeth Wyatt is a 60 y.o. female who presents for a breast evaluation. The most recent mammogram was done on 01/25/16 and added views on 02/16/16.Marland Kitchen  Patient does perform regular self breast checks and gets regular mammograms done.   I have reviewed the history of present illness with the patient. HPI  Past Medical History  Diagnosis Date  . HLD (hyperlipidemia)   . HTN (hypertension)   . Asthma   . Obesity   . Allergy   . Personal history of tobacco use, presenting hazards to health   . Special screening for malignant neoplasms, colon   . TIA (transient ischemic attack)   . CAD (coronary artery disease)   . Personal history of colonic polyps   . Family history of malignant neoplasm of breast   . Broken rib   . Osteoarthritis     Past Surgical History  Procedure Laterality Date  . Mouth surgery    . Dilation and curettage of uterus  2012  . Breast biopsy Right 2009  . Breast mass excision Right 2009  . Mouth surgery    . Colonoscopy  2008, 2014    [facility]  . Uterine fibroid surgery  2012  . Cardiac catheterization  2010    Dr. Mariah Milling with [facility]    Family History  Problem Relation Age of Onset  . Cancer Maternal Grandmother     cervical and uterine cancer  . Cancer Mother     breast, ovarian/uterine    Social History Social History  Substance Use Topics  . Smoking status: Former Smoker -- 3.00 packs/day for 3 years    Types: Cigarettes  . Smokeless tobacco: Never Used     Comment: tobacco use - no   . Alcohol Use: No     Comment: quit drinking years ago, used to drink on weekends    Allergies  Allergen Reactions  . Diphenhydramine Hcl     Increased heart rate  . Latex Other (See Comments)    Blisters,then peels skin  . Montelukast Sodium Other (See Comments)    Jittery  . Pamabrom     This  medication is in midol and caused anuria  . Simvastatin Other (See Comments)    dizziness  . Sulfonamide Derivatives     vomiting  . Wellbutrin [Bupropion]     Caused major depression and SUICIDAL IDEATION.    Current Outpatient Prescriptions  Medication Sig Dispense Refill  . aspirin 81 MG tablet Take 81 mg by mouth daily.    Marland Kitchen atorvastatin (LIPITOR) 10 MG tablet Take 1 tablet (10 mg total) by mouth daily. 30 tablet 11  . cetirizine (ZYRTEC) 10 MG tablet Take 10 mg by mouth as needed.      . DULoxetine (CYMBALTA) 30 MG capsule Take 3 capsules (90 mg total) by mouth daily. 90 capsule 6  . etodolac (LODINE) 400 MG tablet TAKE 1 TABLET (400 MG TOTAL) BY MOUTH 2 (TWO) TIMES DAILY.    Marland Kitchen FLOVENT HFA 110 MCG/ACT inhaler IHALE 2 PUFFS BY MOUTH TWICE A DAY (Patient taking differently: IHALE 2 PUFFS BY MOUTH AS NEEDED.) 12 Inhaler 6  . levothyroxine (SYNTHROID, LEVOTHROID) 50 MCG tablet Take 1 tablet (50 mcg total) by mouth daily. 30 tablet 3  . nebivolol (BYSTOLIC) 10 MG tablet Take 1 tablet (  10 mg total) by mouth daily. 90 tablet 3  . nitroGLYCERIN (NITROSTAT) 0.4 MG SL tablet Place 0.4 mg under the tongue every 5 (five) minutes as needed.    . nystatin (MYCOSTATIN/NYSTOP) 100000 UNIT/GM POWD One application twice a day to rash 1 Bottle 12  . PRILOSEC OTC 20 MG tablet TAKE ONE TABLET BY MOUTH ONCE DAILY 30 tablet 12  . PROAIR HFA 108 (90 BASE) MCG/ACT inhaler 2 PUFFS PUFF, INHALATION, EVERY 4 HRS AS NEEDED 8.5 Inhaler 6  . topiramate (TOPAMAX) 50 MG tablet TAKE 2 TABLETS BY MOUTH AT BEDTIME 60 tablet 9  . traMADol (ULTRAM) 50 MG tablet Take 1 tablet (50 mg total) by mouth 2 (two) times daily. (Patient taking differently: Take 50 mg by mouth as needed. ) 60 tablet 1  . verapamil (VERELAN PM) 240 MG 24 hr capsule Take 1 capsule (240 mg total) by mouth daily. 90 capsule 3   No current facility-administered medications for this visit.    Review of Systems Review of Systems  Blood pressure  124/78, pulse 78, resp. rate 14, height 5\' 4"  (1.626 m), weight 274 lb (124.286 kg).  Physical Exam Physical Exam  Constitutional: She is oriented to person, place, and time. She appears well-developed and well-nourished.  Eyes: Conjunctivae are normal. No scleral icterus.  Neck: Neck supple.  Cardiovascular: Normal rate, regular rhythm and normal heart sounds.   Pulmonary/Chest: Effort normal and breath sounds normal. Right breast exhibits no inverted nipple, no mass, no nipple discharge, no skin change and no tenderness. Left breast exhibits no inverted nipple, no mass, no nipple discharge, no skin change and no tenderness.  Abdominal: Soft. Bowel sounds are normal. There is no hepatomegaly. There is no tenderness. No hernia.  Lymphadenopathy:    She has no cervical adenopathy.    She has no axillary adenopathy.  Neurological: She is alert and oriented to person, place, and time.  Skin: Skin is warm and dry.    Data Reviewed Mammogram reviewed-stable.  Assessment    Family history of breast cancer. Weight gain- pt has a 23 lb weight gain over last yr. Exam otherwise stable.    Plan    The patient has been asked to return to the office in one year with a bilateral screening mammogram. Discussed weight loss with patient. Encouraged pt to discuss with her PCP regarding this.    PCP:  Juanetta GoslingHawkins  This information has been scribed by Ples SpecterJessica Qualls CMA.    Quavon Keisling G 02/22/2016, 12:49 PM

## 2016-02-23 ENCOUNTER — Ambulatory Visit: Payer: Self-pay | Admitting: Family Medicine

## 2016-02-24 ENCOUNTER — Ambulatory Visit (INDEPENDENT_AMBULATORY_CARE_PROVIDER_SITE_OTHER): Payer: Commercial Indemnity | Admitting: Family Medicine

## 2016-02-24 ENCOUNTER — Encounter: Payer: Self-pay | Admitting: Family Medicine

## 2016-02-24 VITALS — BP 124/76 | HR 81 | Temp 98.8°F | Resp 16 | Ht 62.0 in | Wt 270.0 lb

## 2016-02-24 DIAGNOSIS — Z23 Encounter for immunization: Secondary | ICD-10-CM | POA: Diagnosis not present

## 2016-02-24 DIAGNOSIS — F4310 Post-traumatic stress disorder, unspecified: Secondary | ICD-10-CM

## 2016-02-24 DIAGNOSIS — F329 Major depressive disorder, single episode, unspecified: Secondary | ICD-10-CM

## 2016-02-24 DIAGNOSIS — F331 Major depressive disorder, recurrent, moderate: Secondary | ICD-10-CM | POA: Diagnosis not present

## 2016-02-24 DIAGNOSIS — E034 Atrophy of thyroid (acquired): Secondary | ICD-10-CM | POA: Diagnosis not present

## 2016-02-24 DIAGNOSIS — B359 Dermatophytosis, unspecified: Secondary | ICD-10-CM | POA: Diagnosis not present

## 2016-02-24 DIAGNOSIS — I10 Essential (primary) hypertension: Secondary | ICD-10-CM | POA: Diagnosis not present

## 2016-02-24 DIAGNOSIS — F419 Anxiety disorder, unspecified: Secondary | ICD-10-CM | POA: Diagnosis not present

## 2016-02-24 DIAGNOSIS — B354 Tinea corporis: Secondary | ICD-10-CM | POA: Diagnosis not present

## 2016-02-24 DIAGNOSIS — F32A Depression, unspecified: Secondary | ICD-10-CM

## 2016-02-24 DIAGNOSIS — E038 Other specified hypothyroidism: Secondary | ICD-10-CM

## 2016-02-24 MED ORDER — NYSTATIN 100000 UNIT/GM EX POWD: CUTANEOUS | Status: DC

## 2016-02-24 NOTE — Progress Notes (Signed)
Name: Elizabeth Wyatt   MRN: [id number]    DOB: 06-May-1956   Date:02/24/2016       Progress Note  Subjective  Chief Complaint  Chief Complaint  Patient presents with  . Hypertension  . Hypothyroidism    HPI Here for f/u of Hypothyroidism and HBP.  She has been on 50 mcg. of Levothyroxine for past 6 months.  No problem-specific assessment & plan notes found for this encounter.   Past Medical History  Diagnosis Date  . HLD (hyperlipidemia)   . HTN (hypertension)   . Asthma   . Obesity   . Allergy   . Personal history of tobacco use, presenting hazards to health   . Special screening for malignant neoplasms, colon   . TIA (transient ischemic attack)   . CAD (coronary artery disease)   . Personal history of colonic polyps   . Family history of malignant neoplasm of breast   . Broken rib   . Osteoarthritis     Past Surgical History  Procedure Laterality Date  . Mouth surgery    . Dilation and curettage of uterus  2012  . Breast biopsy Right 2009  . Breast mass excision Right 2009  . Mouth surgery    . Colonoscopy  2008, 2014    [facility]  . Uterine fibroid surgery  2012  . Cardiac catheterization  2010    Dr. Mariah Milling with [facility]    Family History  Problem Relation Age of Onset  . Cancer Maternal Grandmother     cervical and uterine cancer  . Cancer Mother     breast, ovarian/uterine    Social History   Social History  . Marital Status: Married    Spouse Name: N/A  . Number of Children: N/A  . Years of Education: N/A   Occupational History  . Not on file.   Social History Main Topics  . Smoking status: Former Smoker -- 3.00 packs/day for 3 years    Types: Cigarettes  . Smokeless tobacco: Never Used     Comment: tobacco use - no   . Alcohol Use: No     Comment: quit drinking years ago, used to drink on weekends  . Drug Use: No  . Sexual Activity: Not on file   Other Topics Concern  . Not on file   Social History Narrative   Married, does not get  regular exercise.      Current outpatient prescriptions:  .  aspirin 81 MG tablet, Take 81 mg by mouth daily., Disp: , Rfl:  .  atorvastatin (LIPITOR) 10 MG tablet, Take 1 tablet (10 mg total) by mouth daily., Disp: 30 tablet, Rfl: 11 .  cetirizine (ZYRTEC) 10 MG tablet, Take 10 mg by mouth as needed.  , Disp: , Rfl:  .  DULoxetine (CYMBALTA) 30 MG capsule, Take 3 capsules (90 mg total) by mouth daily., Disp: 90 capsule, Rfl: 6 .  etodolac (LODINE) 400 MG tablet, TAKE 1 TABLET (400 MG TOTAL) BY MOUTH 2 (TWO) TIMES DAILY., Disp: , Rfl:  .  FLOVENT HFA 110 MCG/ACT inhaler, IHALE 2 PUFFS BY MOUTH TWICE A DAY (Patient taking differently: IHALE 2 PUFFS BY MOUTH AS NEEDED.), Disp: 12 Inhaler, Rfl: 6 .  levothyroxine (SYNTHROID, LEVOTHROID) 50 MCG tablet, Take 1 tablet (50 mcg total) by mouth daily., Disp: 30 tablet, Rfl: 3 .  nebivolol (BYSTOLIC) 10 MG tablet, Take 1 tablet (10 mg total) by mouth daily., Disp: 90 tablet, Rfl: 3 .  nitroGLYCERIN (NITROSTAT) 0.4  MG SL tablet, Place 0.4 mg under the tongue every 5 (five) minutes as needed., Disp: , Rfl:  .  nystatin (MYCOSTATIN/NYSTOP) 100000 UNIT/GM POWD, One application twice a day to rash, Disp: 1 Bottle, Rfl: 12 .  PRILOSEC OTC 20 MG tablet, TAKE ONE TABLET BY MOUTH ONCE DAILY, Disp: 30 tablet, Rfl: 12 .  PROAIR HFA 108 (90 BASE) MCG/ACT inhaler, 2 PUFFS PUFF, INHALATION, EVERY 4 HRS AS NEEDED, Disp: 8.5 Inhaler, Rfl: 6 .  topiramate (TOPAMAX) 50 MG tablet, TAKE 2 TABLETS BY MOUTH AT BEDTIME, Disp: 60 tablet, Rfl: 9 .  traMADol (ULTRAM) 50 MG tablet, Take 1 tablet (50 mg total) by mouth 2 (two) times daily. (Patient taking differently: Take 50 mg by mouth as needed. ), Disp: 60 tablet, Rfl: 1 .  verapamil (VERELAN PM) 240 MG 24 hr capsule, Take 1 capsule (240 mg total) by mouth daily., Disp: 90 capsule, Rfl: 3  Allergies  Allergen Reactions  . Diphenhydramine Hcl     Increased heart rate  . Latex Other (See Comments)    Blisters,then peels skin   . Montelukast Sodium Other (See Comments)    Jittery  . Pamabrom     This medication is in midol and caused anuria  . Simvastatin Other (See Comments)    dizziness  . Sulfonamide Derivatives     vomiting  . Wellbutrin [Bupropion]     Caused major depression and SUICIDAL IDEATION.     Review of Systems  Constitutional: Negative for fever, chills, weight loss and malaise/fatigue.  HENT: Negative for hearing loss.   Eyes: Negative for blurred vision and double vision.  Respiratory: Negative for cough, shortness of breath and wheezing.   Cardiovascular: Negative for chest pain, palpitations and leg swelling.  Gastrointestinal: Negative for heartburn, abdominal pain and blood in stool.  Genitourinary: Negative for dysuria, urgency and frequency.  Musculoskeletal: Negative for myalgias.  Skin: Negative for rash.  Neurological: Negative for tremors, weakness and headaches.      Objective  Filed Vitals:   02/24/16 1120  BP: 124/76  Pulse: 81  Temp: 98.8 F (37.1 C)  TempSrc: Oral  Resp: 16  Height: 5\' 2"  (1.575 m)  Weight: 270 lb (122.471 kg)  SpO2: 97%    Physical Exam  Constitutional: She is oriented to person, place, and time and well-developed, well-nourished, and in no distress. No distress.  HENT:  Head: Normocephalic and atraumatic.  Eyes: Conjunctivae and EOM are normal. Pupils are equal, round, and reactive to light. No scleral icterus.  Neck: Normal range of motion. Neck supple. Carotid bruit is not present. No thyromegaly present.  Cardiovascular: Normal rate, regular rhythm and normal heart sounds.  Exam reveals no gallop and no friction rub.   No murmur heard. Pulmonary/Chest: Effort normal and breath sounds normal. No respiratory distress. She has no wheezes. She has no rales.  Musculoskeletal: Normal range of motion. She exhibits no edema.  Lymphadenopathy:    She has no cervical adenopathy.  Neurological: She is alert and oriented to person, place, and  time.  Vitals reviewed.      Recent Results (from the past 2160 hour(s))  TSH     Status: Abnormal   Collection Time: 01/14/16 11:39 AM  Result Value Ref Range   TSH 5.370 (H) 0.450 - 4.500 uIU/mL  B12 and Folate Panel     Status: None   Collection Time: 01/14/16 11:39 AM  Result Value Ref Range   Vitamin B-12 693 211 - 946 pg/mL  Folate 7.1 >3.0 ng/mL    Comment: A serum folate concentration of less than 3.1 ng/mL is considered to represent clinical deficiency.   ToxASSURE Select 13 (MW), Urine     Status: None   Collection Time: 01/22/16  2:40 PM  Result Value Ref Range   Report Summary FINAL     Comment: ==================================================================== TOXASSURE SELECT 13 (MW) ==================================================================== Test                             Result       Flag       Units   NO DRUGS DETECTED. ==================================================================== Test                      Result    Flag   Units      Ref Range   Creatinine              107              mg/dL      >=16 ==================================================================== Declared Medications:  The flagging and interpretation on this report are based on the  following declared medications.  Unexpected results may arise from  inaccuracies in the declared medications.  **Note: The testing scope of this panel does not include following  reported medications:  Topiramate (Topamax) ==================================================================== For clinical consultation, please call [phone]. ======================================= =============================    PDF .      Assessment & Plan  Problem List Items Addressed This Visit      Cardiovascular and Mediastinum   Hypertension     Endocrine   Hypothyroidism - Primary   Relevant Orders   TSH     Musculoskeletal and Integument   Tinea corporis   Relevant  Medications   nystatin (MYCOSTATIN/NYSTOP) 100000 UNIT/GM POWD     Other   Morbid obesity (HCC)   Anxiety   Clinical depression   PTSD (post-traumatic stress disorder)   Major depressive disorder, recurrent episode, moderate (HCC)    Other Visit Diagnoses    Immunization due        Relevant Orders    Varicella-zoster Immune Globulin    Tinea        Relevant Medications    nystatin (MYCOSTATIN/NYSTOP) 100000 UNIT/GM POWD       Meds ordered this encounter  Medications  . nystatin (MYCOSTATIN/NYSTOP) 100000 UNIT/GM POWD    Sig: One application twice a day to rash    Dispense:  1 Bottle    Refill:  12  1. Essential hypertension   2. Hypothyroidism due to acquired atrophy of thyroid  - TSH  3. Morbid obesity due to excess calories (HCC)   4. Clinical depression   5. Anxiety   6. PTSD (post-traumatic stress disorder)   7. Major depressive disorder, recurrent episode, moderate (HCC)   8. Immunization due  - Varicella-zoster Immune Globulin  9. Tinea corporis  - nystatin (MYCOSTATIN/NYSTOP) 100000 UNIT/GM POWD; One application twice a day to rash  Dispense: 1 Bottle; Refill: 12  10. Tinea

## 2016-02-26 LAB — TSH: TSH: 2.83 u[IU]/mL (ref 0.450–4.500)

## 2016-04-11 ENCOUNTER — Telehealth: Payer: Self-pay | Admitting: Family Medicine

## 2016-04-11 NOTE — Telephone Encounter (Signed)
Pt said she thought she had a minor TIA on Saturday that lasted for 2 to 3 hours.  I put her on the schedule on Thursday.  Please call [phone]

## 2016-04-11 NOTE — Telephone Encounter (Signed)
Patient advised via [email or web address]

## 2016-04-11 NOTE — Telephone Encounter (Signed)
Call her and let her know to start Aspirin 325 mg., 1 daily.  To go to ER immediately if any further sx that she is concerned about.-jh

## 2016-04-14 ENCOUNTER — Ambulatory Visit (INDEPENDENT_AMBULATORY_CARE_PROVIDER_SITE_OTHER): Payer: Commercial Indemnity | Admitting: Family Medicine

## 2016-04-14 ENCOUNTER — Encounter: Payer: Self-pay | Admitting: Family Medicine

## 2016-04-14 VITALS — BP 104/60 | HR 95 | Temp 98.6°F | Resp 16 | Ht 62.0 in | Wt 274.0 lb

## 2016-04-14 DIAGNOSIS — E038 Other specified hypothyroidism: Secondary | ICD-10-CM | POA: Diagnosis not present

## 2016-04-14 DIAGNOSIS — E034 Atrophy of thyroid (acquired): Secondary | ICD-10-CM

## 2016-04-14 DIAGNOSIS — G458 Other transient cerebral ischemic attacks and related syndromes: Secondary | ICD-10-CM

## 2016-04-14 DIAGNOSIS — I10 Essential (primary) hypertension: Secondary | ICD-10-CM | POA: Diagnosis not present

## 2016-04-14 NOTE — Progress Notes (Signed)
Name: Elizabeth Wyatt   MRN: [id number]    DOB: Jul 18, 1956   Date:04/14/2016       Progress Note  Subjective  Chief Complaint  Chief Complaint  Patient presents with  . stroke-like symptoms    weakness in arms, legs, unable to speak.    HPI Had episode 5 days ago when she awoke and could not move he arms and legs.  She could not open eyes.  Initially could not speak.  Her mind was working, but she could not move or communicate. Finally was able to get enough out verbally to get her son to help her.  Even him helping her she could not move.  Finally after 3 hours, she was able to move and speak.  She had had a stroke in June 2001.  Hospitalized for 2 weeks.  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  No problem-specific assessment & plan notes found for this encounter.   Past Medical History  Diagnosis Date  . HLD (hyperlipidemia)   . HTN (hypertension)   . Asthma   . Obesity   . Allergy   . Personal history of tobacco use, presenting hazards to health   . Special screening for malignant neoplasms, colon   . TIA (transient ischemic attack)   . CAD (coronary artery disease)   . Personal history of colonic polyps   . Family history of malignant neoplasm of breast   . Broken rib   . Osteoarthritis     Social History  Substance Use Topics  . Smoking status: Former Smoker -- 3.00 packs/day for 3 years    Types: Cigarettes  . Smokeless tobacco: Never Used     Comment: tobacco use - no   . Alcohol Use: No     Comment: quit drinking years ago, used to drink on weekends     Current outpatient prescriptions:  .  aspirin 81 MG tablet, Take 81 mg by mouth 4 (four) times daily. , Disp: , Rfl:  .  atorvastatin (LIPITOR) 10 MG tablet, Take 1 tablet (10 mg total) by mouth daily., Disp: 30 tablet, Rfl: 11 .  cetirizine (ZYRTEC) 10 MG tablet, Take 10 mg by mouth as needed.  , Disp: , Rfl:  .  DULoxetine (CYMBALTA) 30 MG capsule, Take 3 capsules (90 mg total) by mouth daily., Disp: 90  capsule, Rfl: 6 .  etodolac (LODINE) 400 MG tablet, TAKE 1 TABLET (400 MG TOTAL) BY MOUTH 2 (TWO) TIMES DAILY., Disp: , Rfl:  .  FLOVENT HFA 110 MCG/ACT inhaler, IHALE 2 PUFFS BY MOUTH TWICE A DAY (Patient taking differently: IHALE 2 PUFFS BY MOUTH AS NEEDED.), Disp: 12 Inhaler, Rfl: 6 .  levothyroxine (SYNTHROID, LEVOTHROID) 50 MCG tablet, Take 1 tablet (50 mcg total) by mouth daily., Disp: 30 tablet, Rfl: 3 .  nebivolol (BYSTOLIC) 10 MG tablet, Take 1 tablet (10 mg total) by mouth daily., Disp: 90 tablet, Rfl: 3 .  nitroGLYCERIN (NITROSTAT) 0.4 MG SL tablet, Place 0.4 mg under the tongue every 5 (five) minutes as needed., Disp: , Rfl:  .  nystatin (MYCOSTATIN/NYSTOP) 100000 UNIT/GM POWD, One application twice a day to rash, Disp: 1 Bottle, Rfl: 12 .  PRILOSEC OTC 20 MG tablet, TAKE ONE TABLET BY MOUTH ONCE DAILY, Disp: 30 tablet, Rfl: 12 .  PROAIR HFA 108 (90 BASE) MCG/ACT inhaler, 2 PUFFS PUFF, INHALATION, EVERY 4 HRS AS NEEDED, Disp: 8.5 Inhaler, Rfl: 6 .  topiramate (TOPAMAX) 50 MG tablet, TAKE 2 TABLETS BY MOUTH AT BEDTIME,  Disp: 60 tablet, Rfl: 9 .  traMADol (ULTRAM) 50 MG tablet, Take 1 tablet (50 mg total) by mouth 2 (two) times daily. (Patient taking differently: Take 50 mg by mouth as needed. ), Disp: 60 tablet, Rfl: 1 .  verapamil (VERELAN PM) 240 MG 24 hr capsule, Take 1 capsule (240 mg total) by mouth daily., Disp: 90 capsule, Rfl: 3  Allergies  Allergen Reactions  . Diphenhydramine Hcl     Increased heart rate  . Latex Other (See Comments)    Blisters,then peels skin  . Montelukast Sodium Other (See Comments)    Jittery  . Pamabrom     This medication is in midol and caused anuria  . Simvastatin Other (See Comments)    dizziness  . Sulfonamide Derivatives     vomiting  . Wellbutrin [Bupropion]     Caused major depression and SUICIDAL IDEATION.    Review of Systems  Constitutional: Positive for malaise/fatigue. Negative for fever, chills and weight loss.  HENT: Negative  for hearing loss.   Eyes: Negative for blurred vision and double vision.  Respiratory: Negative for cough, shortness of breath and wheezing.   Cardiovascular: Negative for chest pain, palpitations and leg swelling.  Gastrointestinal: Negative for heartburn, abdominal pain and blood in stool.  Genitourinary: Negative for dysuria, urgency and frequency.  Musculoskeletal: Negative for myalgias and joint pain.  Skin: Negative for rash.  Neurological: Positive for dizziness, weakness and headaches. Negative for tremors.       No neurologic sx at this time.  Psychiatric/Behavioral: The patient is nervous/anxious.       Objective  Filed Vitals:   04/14/16 0920  BP: 104/60  Pulse: 95  Temp: 98.6 F (37 C)  TempSrc: Oral  Resp: 16  Height: 5' 2" (1.575 m)  Weight: 274 lb (124.286 kg)  SpO2: 95%     Physical Exam  Constitutional: She is oriented to person, place, and time and well-developed, well-nourished, and in no distress. No distress.  HENT:  Head: Normocephalic and atraumatic.  Eyes: Conjunctivae and EOM are normal. Pupils are equal, round, and reactive to light. No scleral icterus.  Neck: Normal range of motion. Neck supple. Carotid bruit is not present. No thyromegaly present.  Cardiovascular: Normal rate, regular rhythm and normal heart sounds.  Exam reveals no gallop and no friction rub.   No murmur heard. Pulmonary/Chest: Effort normal and breath sounds normal. No respiratory distress. She has no wheezes. She has no rales.  Musculoskeletal: She exhibits no edema.  Lymphadenopathy:    She has no cervical adenopathy.  Neurological: She is alert and oriented to person, place, and time. No cranial nerve deficit. Gait normal. Coordination normal.  Psychiatric:  She is depressed and anxious.  Vitals reviewed.     Recent Results (from the past 2160 hour(s))  ToxASSURE Select 13 (MW), Urine     Status: None   Collection Time: 01/22/16  2:40 PM  Result Value Ref Range    Report Summary FINAL     Comment: ==================================================================== TOXASSURE SELECT 13 (MW) ==================================================================== Test                             Result       Flag       Units   NO DRUGS DETECTED. ==================================================================== Test                      Result    Flag  Units      Ref Range   Creatinine              107              mg/dL      >=20 ==================================================================== Declared Medications:  The flagging and interpretation on this report are based on the  following declared medications.  Unexpected results may arise from  inaccuracies in the declared medications.  **Note: The testing scope of this panel does not include following  reported medications:  Topiramate (Topamax) ==================================================================== For clinical consultation, please call [phone]. ======================================= =============================    PDF .   TSH     Status: None   Collection Time: 02/25/16 11:56 AM  Result Value Ref Range   TSH 2.830 0.450 - 4.500 uIU/mL     Assessment & Plan 1. Other specified transient cerebral ischemias  - CBC with Differential - Sed Rate (ESR) - Ambulatory referral to Neurology-Dr. Manuella Ghazi  - CT Head Wo Contrast; Future  2. Hypothyroidism due to acquired atrophy of thyroid  - TSH  3. Essential hypertension  - Comprehensive Metabolic Panel (CMET) - Lipid Profile

## 2016-04-16 LAB — CBC WITH DIFFERENTIAL/PLATELET
BASOS: 1 %
Basophils Absolute: 0 10*3/uL (ref 0.0–0.2)
EOS (ABSOLUTE): 0.1 10*3/uL (ref 0.0–0.4)
EOS: 2 %
HEMATOCRIT: 38.8 % (ref 34.0–46.6)
Hemoglobin: 12.7 g/dL (ref 11.1–15.9)
IMMATURE GRANULOCYTES: 0 %
Immature Grans (Abs): 0 10*3/uL (ref 0.0–0.1)
LYMPHS ABS: 1.8 10*3/uL (ref 0.7–3.1)
Lymphs: 41 %
MCH: 28.8 pg (ref 26.6–33.0)
MCHC: 32.7 g/dL (ref 31.5–35.7)
MCV: 88 fL (ref 79–97)
MONOS ABS: 0.5 10*3/uL (ref 0.1–0.9)
Monocytes: 10 %
NEUTROS ABS: 2.1 10*3/uL (ref 1.4–7.0)
Neutrophils: 46 %
PLATELETS: 246 10*3/uL (ref 150–379)
RBC: 4.41 x10E6/uL (ref 3.77–5.28)
RDW: 14.7 % (ref 12.3–15.4)
WBC: 4.5 10*3/uL (ref 3.4–10.8)

## 2016-04-16 LAB — COMPREHENSIVE METABOLIC PANEL
A/G RATIO: 1.7 (ref 1.2–2.2)
ALBUMIN: 4 g/dL (ref 3.6–4.8)
ALT: 18 IU/L (ref 0–32)
AST: 21 IU/L (ref 0–40)
Alkaline Phosphatase: 85 IU/L (ref 39–117)
BUN / CREAT RATIO: 22 (ref 12–28)
BUN: 22 mg/dL (ref 8–27)
Bilirubin Total: 0.4 mg/dL (ref 0.0–1.2)
CALCIUM: 9.2 mg/dL (ref 8.7–10.3)
CO2: 22 mmol/L (ref 18–29)
CREATININE: 0.99 mg/dL (ref 0.57–1.00)
Chloride: 99 mmol/L (ref 96–106)
GFR, EST AFRICAN AMERICAN: 72 mL/min/{1.73_m2} (ref >59)
GFR, EST NON AFRICAN AMERICAN: 62 mL/min/{1.73_m2} (ref >59)
GLOBULIN, TOTAL: 2.4 g/dL (ref 1.5–4.5)
Glucose: 90 mg/dL (ref 65–99)
POTASSIUM: 4.8 mmol/L (ref 3.5–5.2)
SODIUM: 137 mmol/L (ref 134–144)
TOTAL PROTEIN: 6.4 g/dL (ref 6.0–8.5)

## 2016-04-16 LAB — LIPID PANEL
CHOL/HDL RATIO: 1.7 ratio (ref 0.0–4.4)
Cholesterol, Total: 164 mg/dL (ref 100–199)
HDL: 94 mg/dL (ref >39)
LDL Calculated: 44 mg/dL (ref 0–99)
Triglycerides: 128 mg/dL (ref 0–149)
VLDL Cholesterol Cal: 26 mg/dL (ref 5–40)

## 2016-04-16 LAB — TSH: TSH: 2.64 u[IU]/mL (ref 0.450–4.500)

## 2016-04-16 LAB — SEDIMENTATION RATE: SED RATE: 24 mm/h (ref 0–40)

## 2016-04-20 ENCOUNTER — Ambulatory Visit: Admission: RE | Admit: 2016-04-20 | Payer: 59 | Source: Ambulatory Visit

## 2016-04-21 ENCOUNTER — Ambulatory Visit
Admission: RE | Admit: 2016-04-21 | Discharge: 2016-04-21 | Disposition: A | Discharge status: Home / Self Care | Payer: 59 | Source: Ambulatory Visit | Attending: Family Medicine | Admitting: Family Medicine

## 2016-04-21 DIAGNOSIS — G458 Other transient cerebral ischemic attacks and related syndromes: Secondary | ICD-10-CM | POA: Diagnosis not present

## 2016-05-06 ENCOUNTER — Other Ambulatory Visit: Payer: Self-pay | Admitting: Neurology

## 2016-05-06 DIAGNOSIS — R2990 Unspecified symptoms and signs involving the nervous system: Secondary | ICD-10-CM

## 2016-05-13 ENCOUNTER — Encounter: Payer: Self-pay | Admitting: Cardiovascular Disease

## 2016-05-13 ENCOUNTER — Ambulatory Visit (INDEPENDENT_AMBULATORY_CARE_PROVIDER_SITE_OTHER): Payer: 59 | Admitting: Cardiovascular Disease

## 2016-05-13 VITALS — BP 100/72 | HR 78 | Ht 62.0 in | Wt 271.2 lb

## 2016-05-13 DIAGNOSIS — I10 Essential (primary) hypertension: Secondary | ICD-10-CM | POA: Diagnosis not present

## 2016-05-13 DIAGNOSIS — E785 Hyperlipidemia, unspecified: Secondary | ICD-10-CM

## 2016-05-13 DIAGNOSIS — R0789 Other chest pain: Secondary | ICD-10-CM | POA: Diagnosis not present

## 2016-05-13 DIAGNOSIS — G458 Other transient cerebral ischemic attacks and related syndromes: Secondary | ICD-10-CM | POA: Diagnosis not present

## 2016-05-13 DIAGNOSIS — F4323 Adjustment disorder with mixed anxiety and depressed mood: Secondary | ICD-10-CM

## 2016-05-13 NOTE — Patient Instructions (Signed)
You are doing well. No medication changes were made.  Please call us if you have new issues that need to be addressed before your next appt.    

## 2016-05-13 NOTE — Progress Notes (Signed)
Patient ID: Elizabeth Wyatt, female   DOB: 15-May-1956, 60 y.o.   MRN: [id number] Cardiology Office Note  Date:  05/13/2016   ID:  Elizabeth Wyatt, DOB 02/12/1956, MRN [id number]  PCP:  Fidel Levy, MD   Chief Complaint  Patient presents with  . other    Meds reviewed by the patient verbally. Pt. c/o fatigue.     HPI:  [name] is a 60 yo woman with PMH of morbid obesity, hypertension, hyperlipidemia, history of chronic chest pain with cardiac catheterization May 2009  showing no significant coronary artery disease (she does have a circumflex coming off the ostium of the RCA) Who presents for routine followup of her chest pain symptoms. She has significant stress at home, son who has autism. Separated from her husband  In follow-up today, she reports that she had an episode of paralysis, aphasia approximately one month ago. Woke up, could not move, could not open her eyes, could not speak. No focal deficits. She reports that her per minute was working but she could not communicate. After several hours was able to call for help  Reports having similar symptoms many years ago and was kept in the hospital at that time. 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   CT scan of the head showed no pathology She is scheduled to have carotid ultrasound, possibly MRI, follow-up with neurology  EKG on today's visit shows normal sinus rhythm with rate 79 bpm, no significant ST or T-wave changes   Other past medical history Stress test 04/2008:  Cardiac catheter at the same time   PMH:   has a past medical history of HLD (hyperlipidemia); HTN (hypertension); Asthma; Obesity; Allergy; Personal history of tobacco use, presenting hazards to health; Special screening for malignant neoplasms, colon; TIA (transient ischemic attack); CAD (coronary artery disease); Personal history of colonic polyps; Family history of malignant neoplasm of breast;  Broken rib; and Osteoarthritis.  PSH:    Past Surgical History  Procedure Laterality Date  . Mouth surgery    . Dilation and curettage of uterus  2012  . Breast biopsy Right 2009  . Breast mass excision Right 2009  . Mouth surgery    . Colonoscopy  2008, 2014    [facility]  . Uterine fibroid surgery  2012  . Cardiac catheterization  2010    Dr. Mariah Milling with [facility]    Current Outpatient Prescriptions  Medication Sig Dispense Refill  . aspirin 81 MG tablet Take 81 mg by mouth 4 (four) times daily.     Marland Kitchen atorvastatin (LIPITOR) 10 MG tablet Take 1 tablet (10 mg total) by mouth daily. 30 tablet 11  . cetirizine (ZYRTEC) 10 MG tablet Take 10 mg by mouth as needed.      . DULoxetine (CYMBALTA) 30 MG capsule Take 3 capsules (90 mg total) by mouth daily. 90 capsule 6  . etodolac (LODINE) 400 MG tablet TAKE 1 TABLET (400 MG TOTAL) BY MOUTH 2 (TWO) TIMES DAILY.    Marland Kitchen FLOVENT HFA 110 MCG/ACT inhaler IHALE 2 PUFFS BY MOUTH TWICE A DAY (Patient taking differently: IHALE 2 PUFFS BY MOUTH AS NEEDED.) 12 Inhaler 6  . levothyroxine (SYNTHROID, LEVOTHROID) 50 MCG tablet Take 1 tablet (50 mcg total) by mouth daily. 30 tablet 3  . nebivolol (BYSTOLIC) 10 MG tablet Take 1 tablet (10 mg total) by mouth daily. 90 tablet 3  . nitroGLYCERIN (NITROSTAT) 0.4 MG SL tablet Place 0.4 mg  under the tongue every 5 (five) minutes as needed.    . nystatin (MYCOSTATIN/NYSTOP) 100000 UNIT/GM POWD One application twice a day to rash 1 Bottle 12  . PRILOSEC OTC 20 MG tablet TAKE ONE TABLET BY MOUTH ONCE DAILY 30 tablet 12  . PROAIR HFA 108 (90 BASE) MCG/ACT inhaler 2 PUFFS PUFF, INHALATION, EVERY 4 HRS AS NEEDED 8.5 Inhaler 6  . topiramate (TOPAMAX) 50 MG tablet TAKE 2 TABLETS BY MOUTH AT BEDTIME 60 tablet 9  . traMADol (ULTRAM) 50 MG tablet Take 1 tablet (50 mg total) by mouth 2 (two) times daily. (Patient taking differently: Take 50 mg by mouth as needed. ) 60 tablet 1  . verapamil (VERELAN PM) 240 MG 24 hr capsule Take 1 capsule  (240 mg total) by mouth daily. 90 capsule 3   No current facility-administered medications for this visit.     Allergies:   Diphenhydramine hcl; Latex; Montelukast sodium; Pamabrom; Simvastatin; Sulfonamide derivatives; and Wellbutrin   Social History:  The patient  reports that she has quit smoking. Her smoking use included Cigarettes. She has a 9 pack-year smoking history. She has never used smokeless tobacco. She reports that she does not drink alcohol or use illicit drugs.   Family History:   family history includes Cancer in her maternal grandmother and mother.    Review of Systems: Review of Systems  Constitutional: Negative.   Respiratory: Negative.   Cardiovascular: Negative.   Gastrointestinal: Negative.   Musculoskeletal: Negative.   Neurological: Negative.   Psychiatric/Behavioral: The patient is nervous/anxious.   All other systems reviewed and are negative.    PHYSICAL EXAM: VS:  BP 100/72 mmHg  Pulse 78  Ht 5\' 2"  (1.575 m)  Wt 271 lb 4 oz (123.038 kg)  BMI 49.60 kg/m2 , BMI Body mass index is 49.6 kg/(m^2). GEN: Well nourished, well developed, in no acute distress HEENT: normal Neck: no JVD, carotid bruits, or masses Cardiac: RRR; no murmurs, rubs, or gallops,no edema  Respiratory:  clear to auscultation bilaterally, normal work of breathing GI: soft, nontender, nondistended, + BS MS: no deformity or atrophy Skin: warm and dry, no rash Neuro:  Strength and sensation are intact Psych: euthymic mood, full affect    Recent Labs: 04/15/2016: ALT 18; BUN 22; Creatinine, Ser 0.99; Platelets 246; Potassium 4.8; Sodium 137; TSH 2.640    Lipid Panel Lab Results  Component Value Date   CHOL 164 04/15/2016   HDL 94 04/15/2016   LDLCALC 44 04/15/2016   TRIG 128 04/15/2016      Wt Readings from Last 3 Encounters:  05/13/16 271 lb 4 oz (123.038 kg)  04/14/16 274 lb (124.286 kg)  02/24/16 270 lb (122.471 kg)       ASSESSMENT AND PLAN:  Other specified  transient cerebral ischemias - Etiology of her recent symptoms is unclear Very nonspecific, does not sound particularly like a TIA with focal deficits Recommended she follow-up with neurology We can review results of carotid and MRI when they are available  HYPERTENSION, BENIGN - Plan: EKG 12-Lead Blood pressure is well controlled on today's visit. No changes made to the medications.  Hyperlipidemia Cholesterol is at goal on the current lipid regimen. No changes to the medications were made.  Atypical chest pain Denies having any recent chest pain symptoms  Morbid obesity due to excess calories (HCC) Poor diet, no regular exercise program Reports she is limited by arthritis  ADJ DISORDER WITH MIXED ANXIETY & DEPRESSED MOOD Significant stress at home, separated from  her husband, disabled child, significant stress at home  Long time spent discussing her recent events detailed above including her other stressors at home  Total encounter time more than 25 minutes  Greater than 50% was spent in counseling and coordination of care with the patient    Disposition:   F/U  prn   Orders Placed This Encounter  Procedures  . EKG 12-Lead     Signed, Dossie Arbourim Gollan, M.D., Ph.D. 05/13/2016  [facility], [place] [phone]

## 2016-05-16 ENCOUNTER — Encounter: Payer: Self-pay | Admitting: General Surgery

## 2016-05-17 ENCOUNTER — Ambulatory Visit (INDEPENDENT_AMBULATORY_CARE_PROVIDER_SITE_OTHER): Payer: Commercial Indemnity | Admitting: Family Medicine

## 2016-05-17 ENCOUNTER — Encounter: Payer: Self-pay | Admitting: Family Medicine

## 2016-05-17 VITALS — BP 108/60 | HR 83 | Temp 99.0°F | Resp 16 | Ht 62.0 in | Wt 274.0 lb

## 2016-05-17 DIAGNOSIS — E038 Other specified hypothyroidism: Secondary | ICD-10-CM

## 2016-05-17 DIAGNOSIS — E034 Atrophy of thyroid (acquired): Secondary | ICD-10-CM | POA: Diagnosis not present

## 2016-05-17 DIAGNOSIS — G459 Transient cerebral ischemic attack, unspecified: Secondary | ICD-10-CM | POA: Diagnosis not present

## 2016-05-17 NOTE — Progress Notes (Signed)
Name: Elizabeth Wyatt   MRN: [id number]    DOB: 07/07/1956   Date:05/17/2016       Progress Note  Subjective  Chief Complaint  Chief Complaint  Patient presents with  . Hypothyroidism  . Transient Ischemic Attack    HPI Here for f/u of "spell"..  He has not had anything like her "paralyzed" spell since.  She has seen Dr. Manuella Ghazi and Dr. Rockey Situ and no source of her episode was found.  She is feeling well. No problem-specific assessment & plan notes found for this encounter.   Past Medical History  Diagnosis Date  . HLD (hyperlipidemia)   . HTN (hypertension)   . Asthma   . Obesity   . Allergy   . Personal history of tobacco use, presenting hazards to health   . Special screening for malignant neoplasms, colon   . TIA (transient ischemic attack)   . CAD (coronary artery disease)   . Personal history of colonic polyps   . Family history of malignant neoplasm of breast   . Broken rib   . Osteoarthritis     Past Surgical History  Procedure Laterality Date  . Mouth surgery    . Dilation and curettage of uterus  2012  . Breast biopsy Right 2009  . Breast mass excision Right 2009  . Mouth surgery    . Colonoscopy  2008, 2014    [place]  . Uterine fibroid surgery  2012  . Cardiac catheterization  2010    Dr. Rockey Situ with [place] [facility]    Family History  Problem Relation Age of Onset  . Cancer Maternal Grandmother     cervical and uterine cancer  . Cancer Mother     breast, ovarian/uterine    Social History   Social History  . Marital Status: Married    Spouse Name: N/A  . Number of Children: N/A  . Years of Education: N/A   Occupational History  . Not on file.   Social History Main Topics  . Smoking status: Former Smoker -- 3.00 packs/day for 3 years    Types: Cigarettes  . Smokeless tobacco: Never Used     Comment: tobacco use - no   . Alcohol Use: No     Comment: quit drinking years ago, used to drink on weekends  . Drug Use: No  . Sexual Activity: Not on file    Other Topics Concern  . Not on file   Social History Narrative   Married, does not get regular exercise.      Current outpatient prescriptions:  .  acetaminophen (TYLENOL) 500 MG tablet, Take 500 mg by mouth every 6 (six) hours as needed., Disp: , Rfl:  .  aspirin 81 MG tablet, Take 81 mg by mouth 4 (four) times daily. , Disp: , Rfl:  .  atorvastatin (LIPITOR) 10 MG tablet, Take 1 tablet (10 mg total) by mouth daily., Disp: 30 tablet, Rfl: 11 .  cetirizine (ZYRTEC) 10 MG tablet, Take 10 mg by mouth as needed.  , Disp: , Rfl:  .  DULoxetine (CYMBALTA) 30 MG capsule, Take 3 capsules (90 mg total) by mouth daily., Disp: 90 capsule, Rfl: 6 .  etodolac (LODINE) 400 MG tablet, TAKE 1 TABLET (400 MG TOTAL) BY MOUTH 2 (TWO) TIMES DAILY., Disp: , Rfl:  .  FLOVENT HFA 110 MCG/ACT inhaler, IHALE 2 PUFFS BY MOUTH TWICE A DAY (Patient taking differently: IHALE 2 PUFFS BY MOUTH AS NEEDED.), Disp: 12 Inhaler, Rfl: 6 .  ibuprofen (  GOODSENSE IBUPROFEN) 200 MG tablet, Take 200 mg by mouth as needed., Disp: , Rfl:  .  levothyroxine (SYNTHROID, LEVOTHROID) 50 MCG tablet, Take 1 tablet (50 mcg total) by mouth daily., Disp: 30 tablet, Rfl: 3 .  LORazepam (ATIVAN) 1 MG tablet, Take 1 mg by mouth as directed., Disp: , Rfl:  .  naproxen (NAPROSYN) 500 MG tablet, Take 500 mg by mouth 2 (two) times daily as needed., Disp: , Rfl:  .  nebivolol (BYSTOLIC) 10 MG tablet, Take 1 tablet (10 mg total) by mouth daily., Disp: 90 tablet, Rfl: 3 .  nitroGLYCERIN (NITROSTAT) 0.4 MG SL tablet, Place 0.4 mg under the tongue every 5 (five) minutes as needed., Disp: , Rfl:  .  nystatin (MYCOSTATIN/NYSTOP) 100000 UNIT/GM POWD, One application twice a day to rash, Disp: 1 Bottle, Rfl: 12 .  omeprazole (PRILOSEC) 20 MG capsule, Take 20 mg by mouth daily., Disp: , Rfl: 3 .  PRILOSEC OTC 20 MG tablet, TAKE ONE TABLET BY MOUTH ONCE DAILY, Disp: 30 tablet, Rfl: 12 .  PROAIR HFA 108 (90 BASE) MCG/ACT inhaler, 2 PUFFS PUFF, INHALATION,  EVERY 4 HRS AS NEEDED, Disp: 8.5 Inhaler, Rfl: 6 .  topiramate (TOPAMAX) 50 MG tablet, TAKE 2 TABLETS BY MOUTH AT BEDTIME, Disp: 60 tablet, Rfl: 9 .  traMADol (ULTRAM) 50 MG tablet, Take 1 tablet (50 mg total) by mouth 2 (two) times daily. (Patient taking differently: Take 50 mg by mouth as needed. ), Disp: 60 tablet, Rfl: 1 .  verapamil (VERELAN PM) 240 MG 24 hr capsule, Take 1 capsule (240 mg total) by mouth daily., Disp: 90 capsule, Rfl: 3  Allergies  Allergen Reactions  . Diphenhydramine Hcl     Increased heart rate  . Latex Other (See Comments)    Blisters,then peels skin  . Montelukast Sodium Other (See Comments)    Jittery  . Pamabrom     This medication is in midol and caused anuria  . Simvastatin Other (See Comments)    dizziness  . Sulfonamide Derivatives     vomiting  . Wellbutrin [Bupropion]     Caused major depression and SUICIDAL IDEATION.     Review of Systems  Constitutional: Negative for fever, chills, weight loss and malaise/fatigue.  HENT: Negative for hearing loss.   Eyes: Negative for blurred vision and double vision.  Respiratory: Negative for shortness of breath and wheezing.   Cardiovascular: Negative for chest pain, palpitations and leg swelling.  Gastrointestinal: Negative for heartburn, abdominal pain and blood in stool.  Genitourinary: Negative for dysuria, urgency and frequency.  Musculoskeletal: Negative for myalgias.  Skin: Negative for rash.  Neurological: Negative for dizziness, tremors, weakness and headaches.  Psychiatric/Behavioral: Negative for depression. The patient is nervous/anxious.       Objective  Filed Vitals:   05/17/16 1034  BP: 108/60  Pulse: 83  Temp: 99 F (37.2 C)  TempSrc: Oral  Resp: 16  Height: 5' 2" (1.575 m)  Weight: 274 lb (124.286 kg)    Physical Exam  Constitutional: She is oriented to person, place, and time and well-developed, well-nourished, and in no distress. No distress.  HENT:  Head:  Normocephalic and atraumatic.  Eyes: Conjunctivae and EOM are normal. Pupils are equal, round, and reactive to light. No scleral icterus.  Neck: Normal range of motion. Neck supple. Carotid bruit is not present. No thyromegaly present.  Cardiovascular: Normal rate, regular rhythm and normal heart sounds.  Exam reveals no gallop and no friction rub.   No murmur  heard. Pulmonary/Chest: Effort normal and breath sounds normal. No respiratory distress. She has no wheezes. She has no rales.  Abdominal: Soft. Bowel sounds are normal. She exhibits no distension and no mass. There is no tenderness.  Musculoskeletal: She exhibits no edema.  Lymphadenopathy:    She has no cervical adenopathy.  Neurological: She is alert and oriented to person, place, and time. She displays normal reflexes. No cranial nerve deficit. She exhibits normal muscle tone. Coordination normal.  Vitals reviewed.      Recent Results (from the past 2160 hour(s))  TSH     Status: None   Collection Time: 02/25/16 11:56 AM  Result Value Ref Range   TSH 2.830 0.450 - 4.500 uIU/mL  Comprehensive Metabolic Panel (CMET)     Status: None   Collection Time: 04/15/16 12:00 PM  Result Value Ref Range   Glucose 90 65 - 99 mg/dL   BUN 22 8 - 27 mg/dL   Creatinine, Ser 0.99 0.57 - 1.00 mg/dL   GFR calc non Af Amer 62 >59 mL/min/1.73   GFR calc Af Amer 72 >59 mL/min/1.73   BUN/Creatinine Ratio 22 12 - 28   Sodium 137 134 - 144 mmol/L   Potassium 4.8 3.5 - 5.2 mmol/L   Chloride 99 96 - 106 mmol/L   CO2 22 18 - 29 mmol/L   Calcium 9.2 8.7 - 10.3 mg/dL   Total Protein 6.4 6.0 - 8.5 g/dL   Albumin 4.0 3.6 - 4.8 g/dL   Globulin, Total 2.4 1.5 - 4.5 g/dL   Albumin/Globulin Ratio 1.7 1.2 - 2.2   Bilirubin Total 0.4 0.0 - 1.2 mg/dL   Alkaline Phosphatase 85 39 - 117 IU/L   AST 21 0 - 40 IU/L   ALT 18 0 - 32 IU/L  CBC with Differential     Status: None   Collection Time: 04/15/16 12:00 PM  Result Value Ref Range   WBC 4.5 3.4 - 10.8  x10E3/uL   RBC 4.41 3.77 - 5.28 x10E6/uL   Hemoglobin 12.7 11.1 - 15.9 g/dL   Hematocrit 38.8 34.0 - 46.6 %   MCV 88 79 - 97 fL   MCH 28.8 26.6 - 33.0 pg   MCHC 32.7 31.5 - 35.7 g/dL   RDW 14.7 12.3 - 15.4 %   Platelets 246 150 - 379 x10E3/uL   Neutrophils 46 %   Lymphs 41 %   Monocytes 10 %   Eos 2 %   Basos 1 %   Neutrophils Absolute 2.1 1.4 - 7.0 x10E3/uL   Lymphocytes Absolute 1.8 0.7 - 3.1 x10E3/uL   Monocytes Absolute 0.5 0.1 - 0.9 x10E3/uL   EOS (ABSOLUTE) 0.1 0.0 - 0.4 x10E3/uL   Basophils Absolute 0.0 0.0 - 0.2 x10E3/uL   Immature Granulocytes 0 %   Immature Grans (Abs) 0.0 0.0 - 0.1 x10E3/uL  Lipid Profile     Status: None   Collection Time: 04/15/16 12:00 PM  Result Value Ref Range   Cholesterol, Total 164 100 - 199 mg/dL   Triglycerides 128 0 - 149 mg/dL   HDL 94 >39 mg/dL   VLDL Cholesterol Cal 26 5 - 40 mg/dL   LDL Calculated 44 0 - 99 mg/dL   Chol/HDL Ratio 1.7 0.0 - 4.4 ratio units    Comment:                                   T. Chol/HDL  Ratio                                             Men  Women                               1/2 Avg.Risk  3.4    3.3                                   Avg.Risk  5.0    4.4                                2X Avg.Risk  9.6    7.1                                3X Avg.Risk 23.4   11.0   TSH     Status: None   Collection Time: 04/15/16 12:00 PM  Result Value Ref Range   TSH 2.640 0.450 - 4.500 uIU/mL  Sed Rate (ESR)     Status: None   Collection Time: 04/15/16 12:00 PM  Result Value Ref Range   Sed Rate 24 0 - 40 mm/hr     Assessment & Plan  Problem List Items Addressed This Visit      Cardiovascular and Mediastinum   TIA (transient ischemic attack) - Primary   Relevant Orders   Sed Rate (ESR)     Endocrine   Hypothyroidism      Meds ordered this encounter  Medications  . naproxen (NAPROSYN) 500 MG tablet    Sig: Take 500 mg by mouth 2 (two) times daily as needed.  Marland Kitchen ibuprofen (GOODSENSE IBUPROFEN) 200 MG  tablet    Sig: Take 200 mg by mouth as needed.  Marland Kitchen acetaminophen (TYLENOL) 500 MG tablet    Sig: Take 500 mg by mouth every 6 (six) hours as needed.  Marland Kitchen DISCONTD: ALPRAZolam (XANAX) 0.5 MG tablet    Sig: Take 0.5 mg by mouth every 8 (eight) hours as needed.  Marland Kitchen LORazepam (ATIVAN) 1 MG tablet    Sig: Take 1 mg by mouth as directed.  Marland Kitchen omeprazole (PRILOSEC) 20 MG capsule    Sig: Take 20 mg by mouth daily.    Refill:  3   1. Transient cerebral ischemia, unspecified transient cerebral ischemia type   2. Hypothyroidism due to acquired atrophy of thyroid

## 2016-05-18 LAB — SEDIMENTATION RATE: SED RATE: 11 mm/h (ref 0–30)

## 2016-05-19 ENCOUNTER — Ambulatory Visit: Payer: 59 | Admitting: Cardiology

## 2016-05-23 ENCOUNTER — Ambulatory Visit: Payer: 59

## 2016-05-23 ENCOUNTER — Other Ambulatory Visit: Payer: Self-pay | Admitting: Neurology

## 2016-05-23 DIAGNOSIS — G459 Transient cerebral ischemic attack, unspecified: Secondary | ICD-10-CM

## 2016-05-30 ENCOUNTER — Other Ambulatory Visit: Payer: Self-pay | Admitting: Family Medicine

## 2016-06-01 ENCOUNTER — Ambulatory Visit
Admission: RE | Admit: 2016-06-01 | Discharge: 2016-06-01 | Disposition: A | Discharge status: Home / Self Care | Payer: 59 | Source: Ambulatory Visit | Attending: Neurology | Admitting: Neurology

## 2016-06-01 DIAGNOSIS — R295 Transient paralysis: Secondary | ICD-10-CM | POA: Diagnosis present

## 2016-06-01 DIAGNOSIS — R2990 Unspecified symptoms and signs involving the nervous system: Secondary | ICD-10-CM | POA: Insufficient documentation

## 2016-06-01 MED ORDER — GADOBENATE DIMEGLUMINE 529 MG/ML IV SOLN
20.0000 mL | Freq: Once | INTRAVENOUS | Status: AC | PRN
  Administered 2016-06-01: 20 mL via INTRAVENOUS

## 2016-06-29 ENCOUNTER — Other Ambulatory Visit: Payer: Self-pay | Admitting: Family Medicine

## 2016-07-31 ENCOUNTER — Other Ambulatory Visit: Payer: Self-pay | Admitting: Family Medicine

## 2016-09-08 ENCOUNTER — Ambulatory Visit (INDEPENDENT_AMBULATORY_CARE_PROVIDER_SITE_OTHER): Payer: Commercial Indemnity | Admitting: Family Medicine

## 2016-09-08 ENCOUNTER — Encounter: Payer: Self-pay | Admitting: Family Medicine

## 2016-09-08 VITALS — BP 110/70 | HR 84 | Temp 98.5°F | Resp 16 | Ht 62.0 in | Wt 278.0 lb

## 2016-09-08 DIAGNOSIS — I10 Essential (primary) hypertension: Secondary | ICD-10-CM

## 2016-09-08 DIAGNOSIS — E038 Other specified hypothyroidism: Secondary | ICD-10-CM

## 2016-09-08 DIAGNOSIS — E034 Atrophy of thyroid (acquired): Secondary | ICD-10-CM

## 2016-09-08 DIAGNOSIS — R4182 Altered mental status, unspecified: Secondary | ICD-10-CM | POA: Diagnosis not present

## 2016-09-08 DIAGNOSIS — B354 Tinea corporis: Secondary | ICD-10-CM | POA: Diagnosis not present

## 2016-09-08 DIAGNOSIS — Z23 Encounter for immunization: Secondary | ICD-10-CM | POA: Diagnosis not present

## 2016-09-08 MED ORDER — OMEPRAZOLE MAGNESIUM 20 MG PO TBEC
20.0000 mg | DELAYED_RELEASE_TABLET | Freq: Every day | ORAL | 12 refills | Status: DC
Start: 2016-09-08 — End: 2017-02-07

## 2016-09-08 MED ORDER — DULOXETINE HCL 30 MG PO CPEP: 90.0000 mg | ORAL_CAPSULE | Freq: Every day | ORAL | 12 refills | Status: DC

## 2016-09-08 MED ORDER — VERAPAMIL HCL ER 240 MG PO CP24: 240.0000 mg | ORAL_CAPSULE | Freq: Every day | ORAL | 3 refills | Status: DC

## 2016-09-08 MED ORDER — NEBIVOLOL HCL 10 MG PO TABS: 10.0000 mg | ORAL_TABLET | Freq: Every day | ORAL | 3 refills | Status: DC

## 2016-09-08 MED ORDER — TOPIRAMATE 50 MG PO TABS: 50.0000 mg | ORAL_TABLET | Freq: Every day | ORAL | 6 refills | Status: DC

## 2016-09-08 MED ORDER — LEVOTHYROXINE SODIUM 50 MCG PO TABS: ORAL_TABLET | ORAL | 12 refills | Status: DC

## 2016-09-08 MED ORDER — TRAMADOL HCL 50 MG PO TABS: 50.0000 mg | ORAL_TABLET | Freq: Four times a day (QID) | ORAL | 5 refills | Status: DC | PRN

## 2016-09-08 MED ORDER — ATORVASTATIN CALCIUM 10 MG PO TABS: 10.0000 mg | ORAL_TABLET | Freq: Every day | ORAL | 11 refills | Status: DC

## 2016-09-08 NOTE — Progress Notes (Signed)
Name: Elizabeth Wyatt   MRN: [id number]    DOB: [birth year]   Date:09/08/2016       Progress Note  Subjective  Chief Complaint  Chief Complaint  Patient presents with  . Follow-up    HPI Here for f/u of TSH and he neurology c/o.  She has seen Dr. Sherryll Burger but has no other information.  She has fallen x 3 over past 2 months.  No warning.  No dizzy spells.  She just falls.  She does not know what had caused any of these falls.  She has problems with mentation (math) at this time also.  She has had no more of her paralized spells. Skin fungus under breasts still present.  No problem-specific Assessment & Plan notes found for this encounter.   Past Medical History:  Diagnosis Date  . Allergy   . Asthma   . Broken rib   . CAD (coronary artery disease)   . Family history of malignant neoplasm of breast   . HLD (hyperlipidemia)   . HTN (hypertension)   . Obesity   . Osteoarthritis   . Personal history of colonic polyps   . Personal history of tobacco use, presenting hazards to health   . Special screening for malignant neoplasms, colon   . TIA (transient ischemic attack)     Past Surgical History:  Procedure Laterality Date  . BREAST BIOPSY Right 2009  . BREAST MASS EXCISION Right 2009  . CARDIAC CATHETERIZATION  2010   Dr. Mariah Milling with [place] [facility]  . COLONOSCOPY  2008, 2014   [place]  . DILATION AND CURETTAGE OF UTERUS  2012  . MOUTH SURGERY    . MOUTH SURGERY    . UTERINE FIBROID SURGERY  2012    Family History  Problem Relation Age of Onset  . Cancer Maternal Grandmother     cervical and uterine cancer  . Cancer Mother     breast, ovarian/uterine    Social History   Social History  . Marital status: Married    Spouse name: N/A  . Number of children: N/A  . Years of education: N/A   Occupational History  . Not on file.   Social History Main Topics  . Smoking status: Former Smoker    Packs/day: 3.00    Years: 3.00    Types: Cigarettes  . Smokeless tobacco: Never  Used     Comment: tobacco use - no   . Alcohol use No     Comment: quit drinking years ago, used to drink on weekends  . Drug use: No  . Sexual activity: Not on file   Other Topics Concern  . Not on file   Social History Narrative   Married, does not get regular exercise.      Current Outpatient Prescriptions:  .  acetaminophen (TYLENOL) 500 MG tablet, Take 500 mg by mouth every 6 (six) hours as needed., Disp: , Rfl:  .  aspirin 325 MG tablet, Take 325 mg by mouth daily., Disp: , Rfl:  .  atorvastatin (LIPITOR) 10 MG tablet, Take 1 tablet (10 mg total) by mouth daily., Disp: 30 tablet, Rfl: 11 .  cetirizine (ZYRTEC) 10 MG tablet, Take 10 mg by mouth as needed.  , Disp: , Rfl:  .  DULoxetine (CYMBALTA) 30 MG capsule, TAKE 3 CAPSULES (90 MG TOTAL) BY MOUTH DAILY., Disp: 90 capsule, Rfl: 12 .  etodolac (LODINE) 400 MG tablet, TAKE 1 TABLET (400 MG TOTAL) BY MOUTH 2 (TWO) TIMES DAILY., Disp: ,  Rfl:  .  FLOVENT HFA 110 MCG/ACT inhaler, IHALE 2 PUFFS BY MOUTH TWICE A DAY (Patient taking differently: IHALE 2 PUFFS BY MOUTH AS NEEDED.), Disp: 12 Inhaler, Rfl: 6 .  ibuprofen (GOODSENSE IBUPROFEN) 200 MG tablet, Take 200 mg by mouth as needed., Disp: , Rfl:  .  levothyroxine (SYNTHROID, LEVOTHROID) 50 MCG tablet, TAKE 1 TABLET (50 MCG TOTAL) BY MOUTH DAILY., Disp: 30 tablet, Rfl: 12 .  LORazepam (ATIVAN) 1 MG tablet, Take 1 mg by mouth as directed., Disp: , Rfl:  .  naproxen (NAPROSYN) 500 MG tablet, Take 500 mg by mouth 2 (two) times daily as needed., Disp: , Rfl:  .  nebivolol (BYSTOLIC) 10 MG tablet, Take 1 tablet (10 mg total) by mouth daily., Disp: 90 tablet, Rfl: 3 .  nitroGLYCERIN (NITROSTAT) 0.4 MG SL tablet, Place 0.4 mg under the tongue every 5 (five) minutes as needed., Disp: , Rfl:  .  nystatin (MYCOSTATIN/NYSTOP) 100000 UNIT/GM POWD, One application twice a day to rash, Disp: 1 Bottle, Rfl: 12 .  PRILOSEC OTC 20 MG tablet, TAKE ONE TABLET BY MOUTH ONCE DAILY, Disp: 30 tablet, Rfl:  12 .  PROAIR HFA 108 (90 BASE) MCG/ACT inhaler, 2 PUFFS PUFF, INHALATION, EVERY 4 HRS AS NEEDED, Disp: 8.5 Inhaler, Rfl: 6 .  topiramate (TOPAMAX) 50 MG tablet, Take 1 tablet (50 mg total) by mouth at bedtime., Disp: 30 tablet, Rfl: 6 .  traMADol (ULTRAM) 50 MG tablet, Take 1 tablet (50 mg total) by mouth 2 (two) times daily. (Patient taking differently: Take 50 mg by mouth every 6 (six) hours as needed. ), Disp: 60 tablet, Rfl: 1 .  verapamil (VERELAN PM) 240 MG 24 hr capsule, Take 1 capsule (240 mg total) by mouth daily., Disp: 90 capsule, Rfl: 3  Allergies  Allergen Reactions  . Diphenhydramine Hcl     Increased heart rate  . Latex Other (See Comments)    Blisters,then peels skin  . Montelukast Sodium Other (See Comments)    Jittery  . Pamabrom     This medication is in midol and caused anuria  . Simvastatin Other (See Comments)    dizziness  . Sulfonamide Derivatives     vomiting  . Wellbutrin [Bupropion]     Caused major depression and SUICIDAL IDEATION.     Review of Systems  Constitutional: Negative for chills, fever, malaise/fatigue and weight loss.  HENT: Negative for hearing loss.   Eyes: Negative for blurred vision and double vision.  Respiratory: Negative for cough, shortness of breath and wheezing.   Cardiovascular: Negative for chest pain, palpitations and PND.  Gastrointestinal: Negative for abdominal pain, blood in stool and heartburn.  Genitourinary: Negative for dysuria, frequency and urgency.  Musculoskeletal: Negative for joint pain and myalgias.  Skin: Negative for rash.  Neurological: Negative for weakness and headaches.       3 falls in past 2 months.      Objective  Vitals:   09/08/16 1040  BP: 110/70  Pulse: 84  Resp: 16  Temp: 98.5 F (36.9 C)  TempSrc: Oral  Weight: 126.1 kg (278 lb)  Height: 5\' 2"  (1.575 m)    Physical Exam  Constitutional: She is oriented to person, place, and time and well-developed, well-nourished, and in no  distress. No distress.  HENT:  Head: Normocephalic and atraumatic.  Eyes: Conjunctivae and EOM are normal. Pupils are equal, round, and reactive to light. No scleral icterus.  Neck: Normal range of motion. Neck supple. Carotid bruit is not  present. No thyromegaly present.  Cardiovascular: Normal rate, regular rhythm and normal heart sounds.  Exam reveals no gallop and no friction rub.   No murmur heard. Pulmonary/Chest: Effort normal and breath sounds normal. No respiratory distress. She has no wheezes. She has no rales.  Musculoskeletal: She exhibits no edema.  Lymphadenopathy:    She has no cervical adenopathy.  Neurological: She is alert and oriented to person, place, and time.  Psychiatric:  Somewhat anxious  Vitals reviewed.      No results found for this or any previous visit (from the past 2160 hour(s)).   Assessment & Plan  Problem List Items Addressed This Visit      Endocrine   Hypothyroidism   Relevant Orders   TSH     Musculoskeletal and Integument   Tinea corporis   Relevant Orders   Ambulatory referral to Dermatology     Other   Mental status, decreased - Primary   Relevant Medications   topiramate (TOPAMAX) 50 MG tablet    Other Visit Diagnoses    Need for vaccination       Relevant Orders   Flu Vaccine QUAD 36+ mos PF IM (Fluarix & Fluzone Quad PF) (Completed)      Meds ordered this encounter  Medications  . DISCONTD: ALPRAZolam (XANAX) 0.25 MG tablet    Sig: Take 0.5 mg by mouth daily as needed.  Marland Kitchen aspirin 325 MG tablet    Sig: Take 325 mg by mouth daily.  Marland Kitchen topiramate (TOPAMAX) 50 MG tablet    Sig: Take 1 tablet (50 mg total) by mouth at bedtime.    Dispense:  30 tablet    Refill:  6   1. Need for vaccination  - Flu Vaccine QUAD 36+ mos PF IM (Fluarix & Fluzone Quad PF)  2. Mental status, decreased  - topiramate (TOPAMAX) 50 MG tablet; Take 1 tablet (50 mg total) by mouth at bedtime.  Dispense: 30 tablet; Refill: 6 (decreased from 2  a day). 3. Hypothyroidism due to acquired atrophy of thyroid  - TSH  4. Tinea corporis  - Ambulatory referral to Dermatology

## 2016-10-11 ENCOUNTER — Other Ambulatory Visit: Payer: Self-pay | Admitting: *Deleted

## 2016-10-11 ENCOUNTER — Encounter: Payer: Self-pay | Admitting: *Deleted

## 2016-10-11 ENCOUNTER — Ambulatory Visit (INDEPENDENT_AMBULATORY_CARE_PROVIDER_SITE_OTHER): Payer: Commercial Indemnity | Admitting: Family Medicine

## 2016-10-11 ENCOUNTER — Encounter: Payer: Self-pay | Admitting: Family Medicine

## 2016-10-11 VITALS — BP 108/60 | HR 70 | Temp 98.5°F | Resp 16 | Ht 60.0 in | Wt 284.0 lb

## 2016-10-11 DIAGNOSIS — I10 Essential (primary) hypertension: Secondary | ICD-10-CM

## 2016-10-11 DIAGNOSIS — F332 Major depressive disorder, recurrent severe without psychotic features: Secondary | ICD-10-CM | POA: Diagnosis not present

## 2016-10-11 DIAGNOSIS — M791 Myalgia: Secondary | ICD-10-CM

## 2016-10-11 DIAGNOSIS — E034 Atrophy of thyroid (acquired): Secondary | ICD-10-CM | POA: Diagnosis not present

## 2016-10-11 DIAGNOSIS — M7918 Myalgia, other site: Secondary | ICD-10-CM

## 2016-10-11 DIAGNOSIS — G43019 Migraine without aura, intractable, without status migrainosus: Secondary | ICD-10-CM

## 2016-10-11 DIAGNOSIS — F419 Anxiety disorder, unspecified: Secondary | ICD-10-CM

## 2016-10-11 MED ORDER — HYDROXYZINE HCL 25 MG PO TABS: 25.0000 mg | ORAL_TABLET | Freq: Three times a day (TID) | ORAL | 2 refills | Status: DC | PRN

## 2016-10-11 MED ORDER — ETODOLAC 400 MG PO TABS: 400.0000 mg | ORAL_TABLET | Freq: Two times a day (BID) | ORAL | 6 refills | Status: DC

## 2016-10-11 NOTE — Progress Notes (Signed)
Name: Elizabeth Wyatt   MRN: [id number]    DOB: 1956-07-13   Date:10/11/2016       Progress Note  Subjective  Chief Complaint  Chief Complaint  Patient presents with  . medication f/u    HPI Here for f/u of mental fogginess and hand tingling.  Since she has reduced Topamax once daily her symptoms have much improved.  No more tingling in hands and mental acuity has much improved.  She has notg had any more migraines.  She is reluctant to stop it altogether.  She is having lots of extra stress in her life at this time.  No problem-specific Assessment & Plan notes found for this encounter.   Past Medical History:  Diagnosis Date  . Allergy   . Asthma   . Broken rib   . CAD (coronary artery disease)   . Family history of malignant neoplasm of breast   . HLD (hyperlipidemia)   . HTN (hypertension)   . Obesity   . Osteoarthritis   . Personal history of colonic polyps   . Personal history of tobacco use, presenting hazards to health   . Special screening for malignant neoplasms, colon   . TIA (transient ischemic attack)     Past Surgical History:  Procedure Laterality Date  . BREAST BIOPSY Right 2009  . BREAST MASS EXCISION Right 2009  . CARDIAC CATHETERIZATION  2010   Dr. Mariah MillingGollan with [facility]  . COLONOSCOPY  2008, 2014   [facility]  . DILATION AND CURETTAGE OF UTERUS  2012  . MOUTH SURGERY    . MOUTH SURGERY    . UTERINE FIBROID SURGERY  2012    Family History  Problem Relation Age of Onset  . Cancer Maternal Grandmother     cervical and uterine cancer  . Cancer Mother     breast, ovarian/uterine    Social History   Social History  . Marital status: Married    Spouse name: N/A  . Number of children: N/A  . Years of education: N/A   Occupational History  . Not on file.   Social History Main Topics  . Smoking status: Former Smoker    Packs/day: 3.00    Years: 3.00    Types: Cigarettes  . Smokeless tobacco: Never Used     Comment: tobacco use - no   . Alcohol  use No     Comment: quit drinking years ago, used to drink on weekends  . Drug use: No  . Sexual activity: Not on file   Other Topics Concern  . Not on file   Social History Narrative   Married, does not get regular exercise.      Current Outpatient Prescriptions:  .  acetaminophen (TYLENOL) 500 MG tablet, Take 500 mg by mouth every 6 (six) hours as needed., Disp: , Rfl:  .  aspirin 325 MG tablet, Take 325 mg by mouth daily., Disp: , Rfl:  .  atorvastatin (LIPITOR) 10 MG tablet, Take 1 tablet (10 mg total) by mouth daily., Disp: 30 tablet, Rfl: 11 .  cetirizine (ZYRTEC) 10 MG tablet, Take 10 mg by mouth as needed.  , Disp: , Rfl:  .  DULoxetine (CYMBALTA) 30 MG capsule, Take 3 capsules (90 mg total) by mouth daily., Disp: 90 capsule, Rfl: 12 .  etodolac (LODINE) 400 MG tablet, TAKE 1 TABLET (400 MG TOTAL) BY MOUTH 2 (TWO) TIMES DAILY., Disp: , Rfl:  .  FLOVENT HFA 110 MCG/ACT inhaler, IHALE 2 PUFFS BY MOUTH  TWICE A DAY (Patient taking differently: IHALE 2 PUFFS BY MOUTH AS NEEDED.), Disp: 12 Inhaler, Rfl: 6 .  fluconazole (DIFLUCAN) 200 MG tablet, Take 200 mg by mouth daily., Disp: , Rfl: 4 .  hydrOXYzine (ATARAX/VISTARIL) 25 MG tablet, Take 1 tablet (25 mg total) by mouth every 8 (eight) hours as needed for anxiety., Disp: 30 tablet, Rfl: 2 .  ibuprofen (GOODSENSE IBUPROFEN) 200 MG tablet, Take 200 mg by mouth as needed., Disp: , Rfl:  .  levothyroxine (SYNTHROID, LEVOTHROID) 50 MCG tablet, TAKE 1 TABLET (50 MCG TOTAL) BY MOUTH DAILY., Disp: 30 tablet, Rfl: 12 .  LORazepam (ATIVAN) 1 MG tablet, Take 1 mg by mouth as directed., Disp: , Rfl:  .  naproxen (NAPROSYN) 500 MG tablet, Take 500 mg by mouth 2 (two) times daily as needed., Disp: , Rfl:  .  nebivolol (BYSTOLIC) 10 MG tablet, Take 1 tablet (10 mg total) by mouth daily., Disp: 90 tablet, Rfl: 3 .  nitroGLYCERIN (NITROSTAT) 0.4 MG SL tablet, Place 0.4 mg under the tongue every 5 (five) minutes as needed., Disp: , Rfl:  .  nystatin  (MYCOSTATIN/NYSTOP) 100000 UNIT/GM POWD, One application twice a day to rash, Disp: 1 Bottle, Rfl: 12 .  omeprazole (PRILOSEC OTC) 20 MG tablet, Take 1 tablet (20 mg total) by mouth daily., Disp: 30 tablet, Rfl: 12 .  omeprazole (PRILOSEC) 20 MG capsule, Take 20 mg by mouth daily., Disp: , Rfl: 3 .  PROAIR HFA 108 (90 BASE) MCG/ACT inhaler, 2 PUFFS PUFF, INHALATION, EVERY 4 HRS AS NEEDED, Disp: 8.5 Inhaler, Rfl: 6 .  topiramate (TOPAMAX) 50 MG tablet, Take 1 tablet (50 mg total) by mouth at bedtime., Disp: 30 tablet, Rfl: 6 .  traMADol (ULTRAM) 50 MG tablet, Take 1 tablet (50 mg total) by mouth every 6 (six) hours as needed., Disp: 120 tablet, Rfl: 5 .  triamcinolone cream (KENALOG) 0.1 %, Apply 1 application topically daily as needed., Disp: , Rfl: 1 .  verapamil (VERELAN PM) 240 MG 24 hr capsule, Take 1 capsule (240 mg total) by mouth daily., Disp: 90 capsule, Rfl: 3  Allergies  Allergen Reactions  . Diphenhydramine Hcl     Increased heart rate  . Latex Other (See Comments)    Blisters,then peels skin  . Montelukast Sodium Other (See Comments)    Jittery  . Pamabrom     This medication is in midol and caused anuria  . Simvastatin Other (See Comments)    dizziness  . Sulfonamide Derivatives     vomiting  . Wellbutrin [Bupropion]     Caused major depression and SUICIDAL IDEATION.     Review of Systems  Constitutional: Negative for chills, fever, malaise/fatigue and weight loss.  HENT: Negative for hearing loss.   Eyes: Negative for blurred vision and double vision.  Respiratory: Negative for cough, shortness of breath and wheezing.   Cardiovascular: Negative for chest pain, palpitations and leg swelling.  Gastrointestinal: Negative for abdominal pain, blood in stool and heartburn.  Genitourinary: Negative for dysuria, frequency and urgency.  Skin: Negative for rash.  Neurological: Negative for dizziness, tingling, tremors, weakness and headaches.  Psychiatric/Behavioral:  Positive for depression. The patient is nervous/anxious.       Objective  Vitals:   10/11/16 1000  BP: 108/60  Pulse: 70  Resp: 16  Temp: 98.5 F (36.9 C)  TempSrc: Oral  Weight: 284 lb (128.8 kg)  Height: 5' (1.524 m)    Physical Exam  Constitutional: She is oriented to person,  place, and time and well-developed, well-nourished, and in no distress. No distress.  HENT:  Head: Normocephalic and atraumatic.  Eyes: Conjunctivae and EOM are normal. Pupils are equal, round, and reactive to light. No scleral icterus.  Neck: Normal range of motion. Neck supple. Carotid bruit is not present. No thyromegaly present.  Cardiovascular: Normal rate, regular rhythm and normal heart sounds.  Exam reveals no gallop and no friction rub.   No murmur heard. Pulmonary/Chest: Effort normal and breath sounds normal. No respiratory distress. She has no wheezes. She has no rales.  Musculoskeletal: She exhibits no edema.  Lymphadenopathy:    She has no cervical adenopathy.  Neurological: She is alert and oriented to person, place, and time.  Vitals reviewed.      No results found for this or any previous visit (from the past 2160 hour(s)).   Assessment & Plan  Problem List Items Addressed This Visit      Cardiovascular and Mediastinum   HYPERTENSION, BENIGN   Headache, migraine - Primary     Endocrine   Hypothyroidism     Other   Anxiety   Relevant Medications   hydrOXYzine (ATARAX/VISTARIL) 25 MG tablet   Major depressive disorder, recurrent, severe without psychotic features (HCC)   Relevant Medications   hydrOXYzine (ATARAX/VISTARIL) 25 MG tablet   Muscle pain, myofacial    Other Visit Diagnoses   None.     Meds ordered this encounter  Medications  . hydrOXYzine (ATARAX/VISTARIL) 25 MG tablet    Sig: Take 1 tablet (25 mg total) by mouth every 8 (eight) hours as needed for anxiety.    Dispense:  30 tablet    Refill:  2   1. Intractable migraine without aura and  without status migrainosus   2. HYPERTENSION, BENIGN   3. Hypothyroidism due to acquired atrophy of thyroid   4. Major depressive disorder, recurrent, severe without psychotic features (HCC)   5. Anxiety  - hydrOXYzine (ATARAX/VISTARIL) 25 MG tablet; Take 1 tablet (25 mg total) by mouth every 8 (eight) hours as needed for anxiety.  Dispense: 30 tablet; Refill: 2  6. Muscle pain, myofacial   Cont. all of her current meds at current doses.

## 2016-12-15 ENCOUNTER — Ambulatory Visit: Payer: Self-pay | Admitting: Family Medicine

## 2016-12-22 IMAGING — MR MR HEAD WO/W CM
12 series · 48 of 48 positions shown · IV contrast (multihance)
Comparison: Head CT without contrast 04/21/2016. Brain MRI
12/02/2005.

CLINICAL DATA: 59-year-old female with TIA symptoms 6 weeks ago for
less than 1 day. Subsequent encounter.

EXAM:
MRI HEAD WITHOUT AND WITH CONTRAST
TECHNIQUE: Multiplanar, multiecho pulse sequences of the brain and surrounding
structures were obtained without and with intravenous contrast.
CONTRAST:  20mL MULTIHANCE GADOBENATE DIMEGLUMINE 529 MG/ML IV SOLN

[Series 2: T1 · sagittal · 5.0mm · 0.45mm/px · 1 of 23 slices shown (1 of 2)]
[im 1/23]
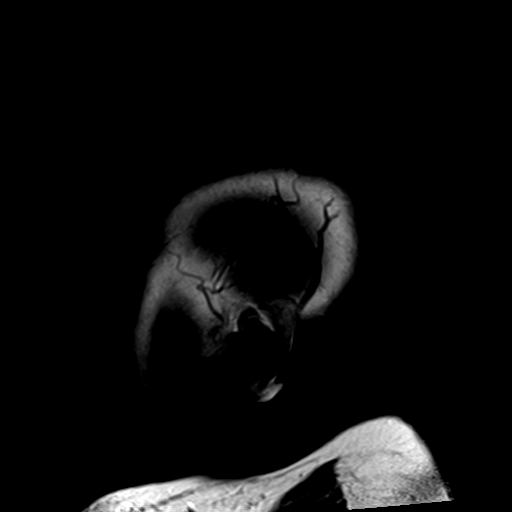

[Series 4: DWI · axial · 3.0mm · 1.20mm/px · z∈[-68,+93]mm · 5 of 55 slices shown (1 of 4)]
[im 1/55]
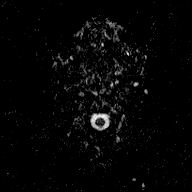
[im 14/55]
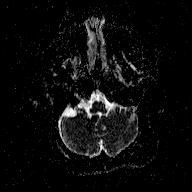
[im 28/55]
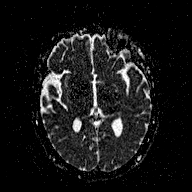
[im 41/55]
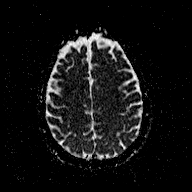
[im 55/55]
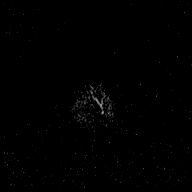

[Series 6: DWI · coronal · 3.0mm · 1.20mm/px · 5 of 48 slices shown (2 of 4)]
[im 1/48]
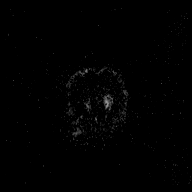
[im 12/48]
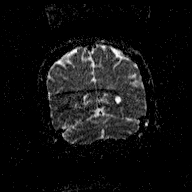
[im 24/48]
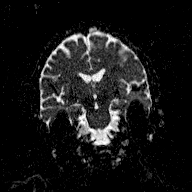
[im 36/48]
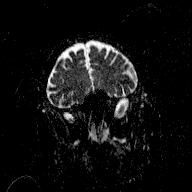
[im 48/48]
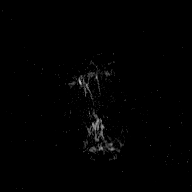

[Series 7: T2 · axial · 5.0mm · 0.72mm/px · z∈[-67,+94]mm · 3 of 26 slices shown (1 of 2)]
[im 1/26]
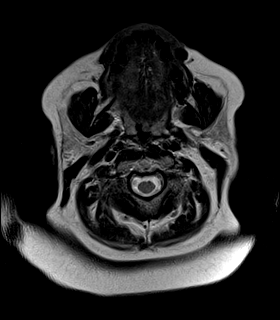
[im 13/26]
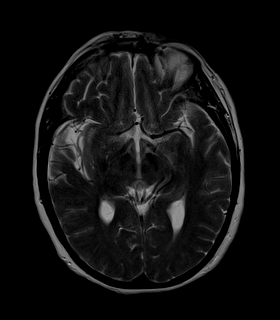
[im 26/26]
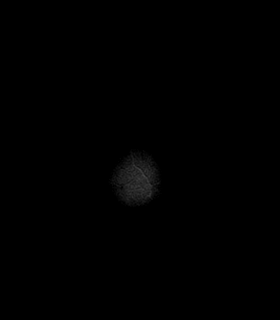

[Series 8: FLAIR · axial · 5.0mm · 0.45mm/px · z∈[-67,+95]mm · 3 of 26 slices shown]
[im 1/26]
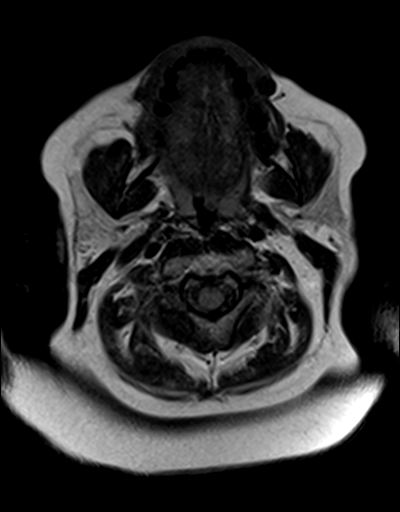
[im 13/26]
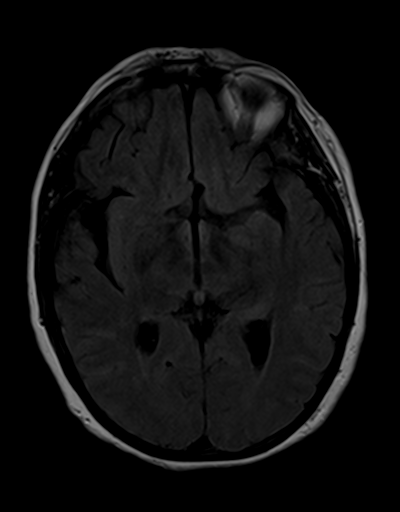
[im 26/26]
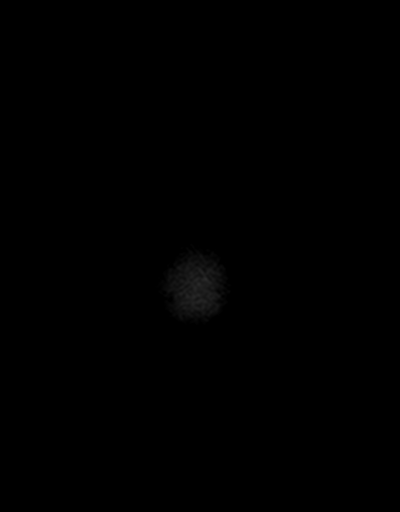

[Series 9: T2 · axial · 5.0mm · 0.72mm/px · z∈[-67,+94]mm · 3 of 26 slices shown (2 of 2)]
[im 1/26]
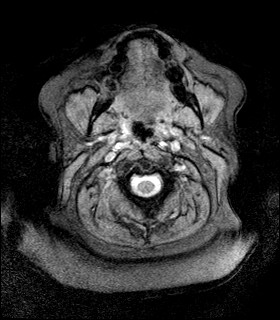
[im 13/26]
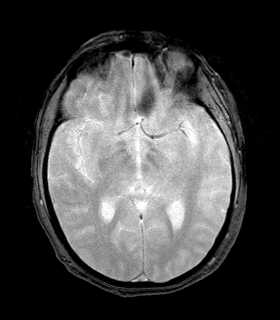
[im 26/26]
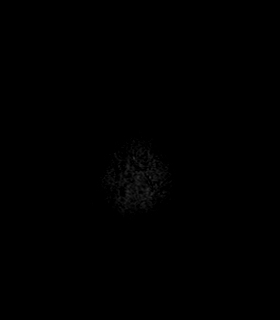

[Series 10: T1 · axial · 3.0mm · 1.00mm/px · z∈[-78,+110]mm · 6 of 64 slices shown (2 of 2)]
[im 1/64]
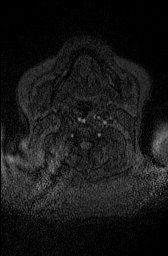
[im 13/64]
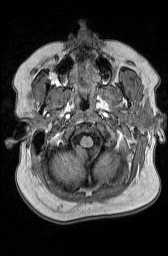
[im 26/64]
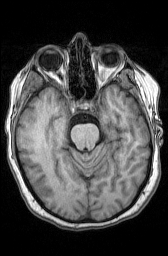
[im 38/64]
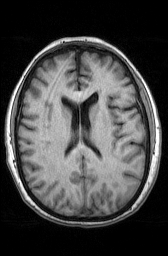
[im 51/64]
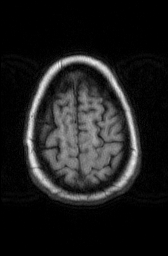
[im 64/64]
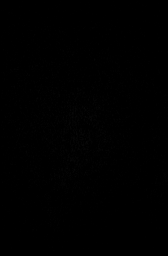

[Series 11: T2 post-contrast · coronal · 5.0mm · 0.45mm/px · 3 of 29 slices shown]
[im 1/29]
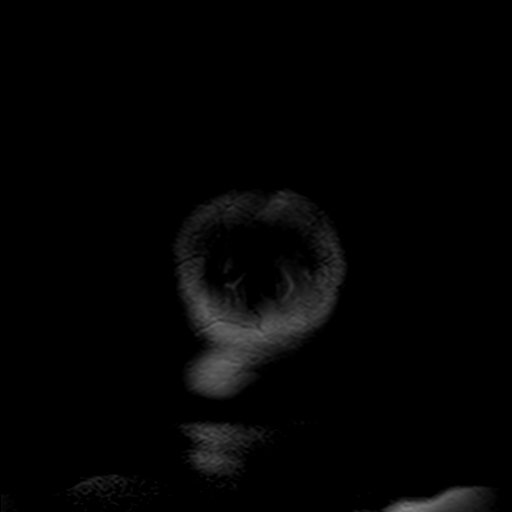
[im 15/29]
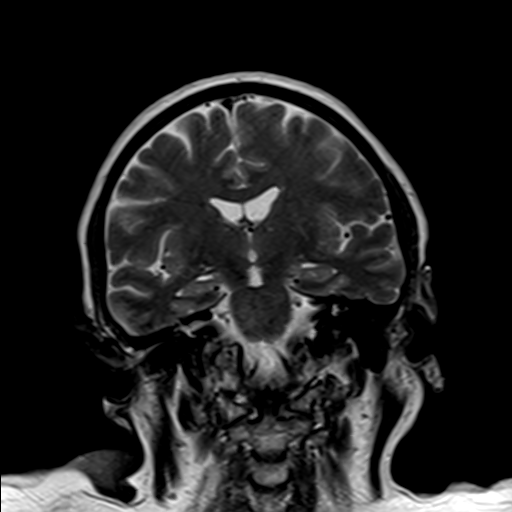
[im 29/29]
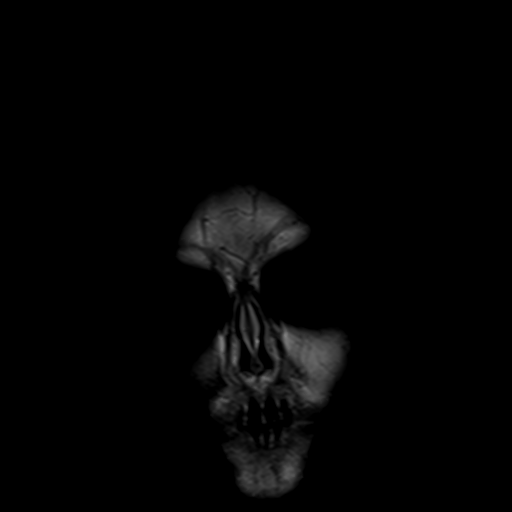

[Series 12: T1 post-contrast · axial · 3.0mm · 1.00mm/px · z∈[-78,+110]mm · 6 of 64 slices shown (1 of 2)]
[im 1/64]
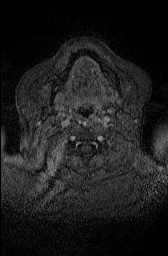
[im 13/64]
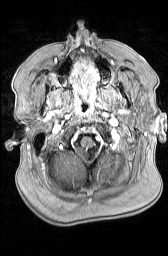
[im 26/64]
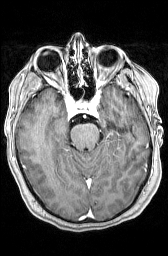
[im 38/64]
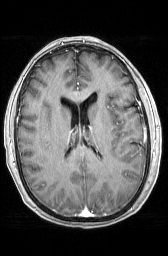
[im 51/64]
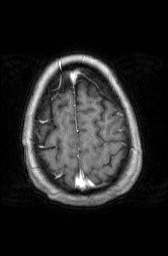
[im 64/64]
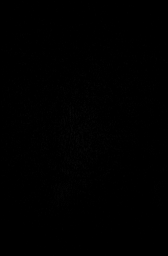

[Series 13: T1 post-contrast · coronal · 5.0mm · 0.45mm/px · 3 of 29 slices shown (2 of 2)]
[im 1/29]
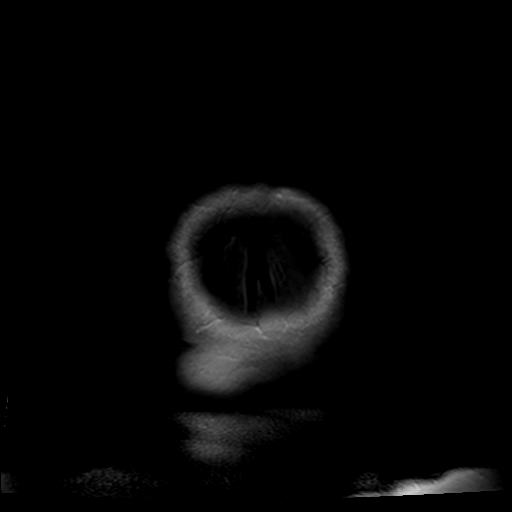
[im 15/29]
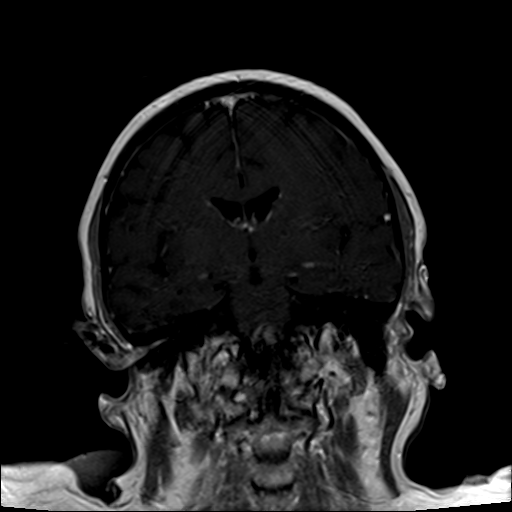
[im 29/29]
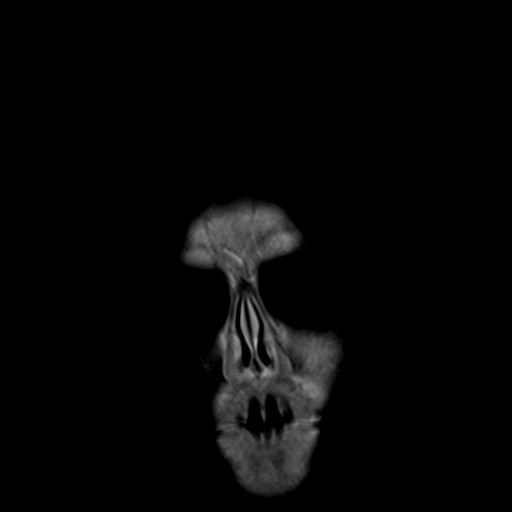

[Series 100: DWI · axial · 3.0mm · 1.20mm/px · z∈[-68,+93]mm · 5 of 55 slices shown (3 of 4)]
[im 1/55]
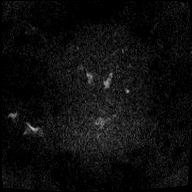
[im 14/55]
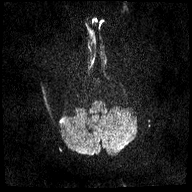
[im 28/55]
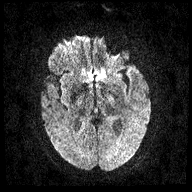
[im 41/55]
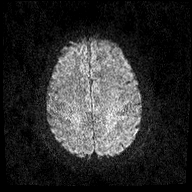
[im 55/55]
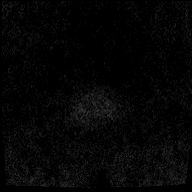

[Series 102: DWI · coronal · 3.0mm · 1.20mm/px · 5 of 48 slices shown (4 of 4)]
[im 1/48]
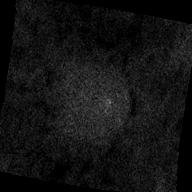
[im 12/48]
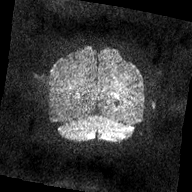
[im 24/48]
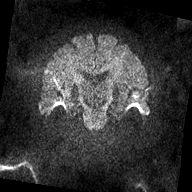
[im 36/48]
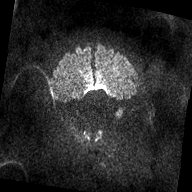
[im 48/48]
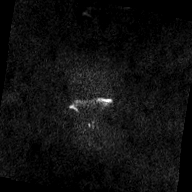

[48 of 48 positions shown; findings below may reference images not displayed]

FINDINGS: Major intracranial vascular flow voids are stable since 4775 and
appear normal. Cerebral volume remains normal for age. No restricted
diffusion to suggest acute infarction. No midline shift, mass
effect, evidence of mass lesion, ventriculomegaly, extra-axial
collection or acute intracranial hemorrhage. Cervicomedullary
junction and pituitary are within normal limits. Gray and white
matter signal is within normal limits for age throughout the brain.
No cortical encephalomalacia or chronic cerebral blood products
identified. Deep gray matter nuclei, brainstem, and cerebellum
appear normal. No abnormal enhancement identified. No dural
thickening.

Visible internal auditory structures appear normal. Stable and well
pneumatized visualized paranasal sinuses and mastoids. Negative
orbit and scalp soft tissues. Negative for age visualized cervical
spine. Visualized bone marrow signal is within normal limits.
IMPRESSION: No acute intracranial abnormality and normal for age MRI appearance
of the brain.

## 2017-02-07 ENCOUNTER — Other Ambulatory Visit: Payer: Self-pay | Admitting: Family Medicine

## 2017-02-20 ENCOUNTER — Encounter: Payer: Self-pay | Admitting: *Deleted

## 2017-02-20 ENCOUNTER — Ambulatory Visit: Payer: Self-pay | Admitting: Family Medicine

## 2017-02-20 ENCOUNTER — Encounter: Payer: Self-pay | Admitting: General Surgery

## 2017-02-20 DIAGNOSIS — I2510 Atherosclerotic heart disease of native coronary artery without angina pectoris: Secondary | ICD-10-CM | POA: Insufficient documentation

## 2017-02-21 ENCOUNTER — Ambulatory Visit: Payer: Self-pay | Admitting: Family Medicine

## 2017-02-23 ENCOUNTER — Ambulatory Visit (INDEPENDENT_AMBULATORY_CARE_PROVIDER_SITE_OTHER): Payer: 59 | Admitting: General Surgery

## 2017-02-23 ENCOUNTER — Encounter: Payer: Self-pay | Admitting: General Surgery

## 2017-02-23 VITALS — BP 104/60 | HR 76 | Resp 16 | Ht 60.0 in | Wt 290.0 lb

## 2017-02-23 DIAGNOSIS — Z8041 Family history of malignant neoplasm of ovary: Secondary | ICD-10-CM

## 2017-02-23 DIAGNOSIS — Z803 Family history of malignant neoplasm of breast: Secondary | ICD-10-CM | POA: Diagnosis not present

## 2017-02-23 NOTE — Progress Notes (Signed)
Patient ID: Elizabeth Wyatt, female   DOB: May 09, 1956, 61 y.o.   MRN: [id number]  Chief Complaint  Patient presents with  . Follow-up    HPI Elizabeth Wyatt is a 61 y.o. female.  who presents for a breast evaluation. The most recent mammogram was done on 02-16-17.  Patient does perform regular self breast checks and gets regular mammograms done.   No new breast issues. I have reviewed the history of present illness with the patient.  HPI  Past Medical History:  Diagnosis Date  . Allergy   . Asthma   . Broken rib   . CAD (coronary artery disease)   . Family history of malignant neoplasm of breast   . HLD (hyperlipidemia)   . HTN (hypertension)   . Obesity   . Osteoarthritis   . Personal history of colonic polyps   . Personal history of tobacco use, presenting hazards to health   . Special screening for malignant neoplasms, colon   . TIA (transient ischemic attack)     Past Surgical History:  Procedure Laterality Date  . BREAST BIOPSY Right 2009  . BREAST MASS EXCISION Right 2009  . CARDIAC CATHETERIZATION  2010   Dr. Mariah Milling with [facility]  . COLONOSCOPY  2008, 2014   [facility]  . DILATION AND CURETTAGE OF UTERUS  2012  . MOUTH SURGERY    . MOUTH SURGERY    . UTERINE FIBROID SURGERY  2012    Family History  Problem Relation Age of Onset  . Cancer Maternal Grandmother     cervical and uterine cancer  . Cancer Mother     breast, ovarian/uterine    Social History Social History  Substance Use Topics  . Smoking status: Former Smoker    Packs/day: 3.00    Years: 3.00    Types: Cigarettes  . Smokeless tobacco: Never Used     Comment: tobacco use - no   . Alcohol use No     Comment: quit drinking years ago, used to drink on weekends    Allergies  Allergen Reactions  . Diphenhydramine Hcl     Increased heart rate  . Latex Other (See Comments)    Blisters,then peels skin  . Montelukast Sodium Other (See Comments)    Jittery  . Pamabrom     This medication is in  midol and caused anuria  . Simvastatin Other (See Comments)    dizziness  . Sulfonamide Derivatives     vomiting  . Wellbutrin [Bupropion]     Caused major depression and SUICIDAL IDEATION.    Current Outpatient Prescriptions  Medication Sig Dispense Refill  . acetaminophen (TYLENOL) 500 MG tablet Take 500 mg by mouth every 6 (six) hours as needed.    Marland Wyatt aspirin 325 MG tablet Take 325 mg by mouth daily.    Marland Wyatt atorvastatin (LIPITOR) 10 MG tablet Take 1 tablet (10 mg total) by mouth daily. 30 tablet 11  . cetirizine (ZYRTEC) 10 MG tablet Take 10 mg by mouth as needed.      . DULoxetine (CYMBALTA) 30 MG capsule Take 3 capsules (90 mg total) by mouth daily. 90 capsule 12  . etodolac (LODINE) 400 MG tablet Take 1 tablet (400 mg total) by mouth 2 (two) times daily. 60 tablet 6  . FLOVENT HFA 110 MCG/ACT inhaler IHALE 2 PUFFS BY MOUTH TWICE A DAY (Patient taking differently: IHALE 2 PUFFS BY MOUTH AS NEEDED.) 12 Inhaler 6  . fluconazole (DIFLUCAN) 200 MG tablet Take 200 mg  by mouth daily.  4  . hydrOXYzine (ATARAX/VISTARIL) 25 MG tablet Take 1 tablet (25 mg total) by mouth every 8 (eight) hours as needed for anxiety. 30 tablet 2  . ibuprofen (GOODSENSE IBUPROFEN) 200 MG tablet Take 200 mg by mouth as needed.    Marland Wyatt. levothyroxine (SYNTHROID, LEVOTHROID) 50 MCG tablet TAKE 1 TABLET (50 MCG TOTAL) BY MOUTH DAILY. 30 tablet 12  . LORazepam (ATIVAN) 1 MG tablet Take 1 mg by mouth as directed.    . naproxen (NAPROSYN) 500 MG tablet Take 500 mg by mouth 2 (two) times daily as needed.    . nebivolol (BYSTOLIC) 10 MG tablet Take 1 tablet (10 mg total) by mouth daily. 90 tablet 3  . nitroGLYCERIN (NITROSTAT) 0.4 MG SL tablet Place 0.4 mg under the tongue every 5 (five) minutes as needed.    . nystatin (MYCOSTATIN/NYSTOP) 100000 UNIT/GM POWD One application twice a day to rash 1 Bottle 12  . omeprazole (PRILOSEC) 20 MG capsule TAKE 1 CAPSULE EVERY DAY 90 capsule 3  . PROAIR HFA 108 (90 BASE) MCG/ACT inhaler 2  PUFFS PUFF, INHALATION, EVERY 4 HRS AS NEEDED 8.5 Inhaler 6  . topiramate (TOPAMAX) 50 MG tablet Take 1 tablet (50 mg total) by mouth at bedtime. 30 tablet 6  . traMADol (ULTRAM) 50 MG tablet Take 1 tablet (50 mg total) by mouth every 6 (six) hours as needed. 120 tablet 5  . triamcinolone cream (KENALOG) 0.1 % Apply 1 application topically daily as needed.  1  . verapamil (VERELAN PM) 240 MG 24 hr capsule Take 1 capsule (240 mg total) by mouth daily. 90 capsule 3   No current facility-administered medications for this visit.     Review of Systems Review of Systems  Constitutional: Negative.   Respiratory: Negative.   Cardiovascular: Negative.     Blood pressure 104/60, pulse 76, resp. rate 16, height 5' (1.524 m), weight 290 lb (131.5 kg).  Physical Exam Physical Exam  Constitutional: She is oriented to person, place, and time. She appears well-developed and well-nourished.  HENT:  Mouth/Throat: Oropharynx is clear and moist.  Eyes: Conjunctivae are normal. No scleral icterus.  Neck: Neck supple.  Cardiovascular: Normal rate, regular rhythm and normal heart sounds.   Pulmonary/Chest: Effort normal and breath sounds normal. Right breast exhibits no inverted nipple, no mass, no nipple discharge, no skin change and no tenderness. Left breast exhibits no inverted nipple, no mass, no nipple discharge, no skin change and no tenderness.  Abdominal: Soft. Normal appearance. There is no tenderness.  Lymphadenopathy:    She has no cervical adenopathy.    She has no axillary adenopathy.  Neurological: She is alert and oriented to person, place, and time.  Skin: Skin is warm and dry.  Psychiatric: Her behavior is normal.    Data Reviewed Mammogram reviewed-stable.   Assessment    Family history of breast and ovarian cancer.  Stable breast exam.    Plan    The patient has been asked to return to the office in one year with a bilateral screening mammogram with Dr Lemar LivingsByrnett.      This information has been scribed by Dorathy DaftMarsha Hatch RN, BSN,BC.   Philicia Heyne G 02/24/2017, 8:18 AM

## 2017-02-23 NOTE — Patient Instructions (Addendum)
The patient is aware to call back for any questions or concerns.  The patient has been asked to return to the office in one year with a bilateral screening mammogram with Dr Byrnett 

## 2017-02-27 ENCOUNTER — Ambulatory Visit: Payer: Managed Care, Other (non HMO) | Admitting: General Surgery

## 2017-03-06 ENCOUNTER — Ambulatory Visit (INDEPENDENT_AMBULATORY_CARE_PROVIDER_SITE_OTHER): Payer: 59 | Admitting: Family Medicine

## 2017-03-06 ENCOUNTER — Other Ambulatory Visit: Payer: Self-pay | Admitting: *Deleted

## 2017-03-06 ENCOUNTER — Encounter: Payer: Self-pay | Admitting: Family Medicine

## 2017-03-06 VITALS — BP 102/59 | HR 73 | Temp 98.8°F | Wt 283.0 lb

## 2017-03-06 DIAGNOSIS — I10 Essential (primary) hypertension: Secondary | ICD-10-CM | POA: Diagnosis not present

## 2017-03-06 DIAGNOSIS — F419 Anxiety disorder, unspecified: Secondary | ICD-10-CM

## 2017-03-06 DIAGNOSIS — F331 Major depressive disorder, recurrent, moderate: Secondary | ICD-10-CM

## 2017-03-06 DIAGNOSIS — M7918 Myalgia, other site: Secondary | ICD-10-CM

## 2017-03-06 DIAGNOSIS — E785 Hyperlipidemia, unspecified: Secondary | ICD-10-CM

## 2017-03-06 DIAGNOSIS — G43019 Migraine without aura, intractable, without status migrainosus: Secondary | ICD-10-CM | POA: Diagnosis not present

## 2017-03-06 DIAGNOSIS — I2510 Atherosclerotic heart disease of native coronary artery without angina pectoris: Secondary | ICD-10-CM | POA: Diagnosis not present

## 2017-03-06 DIAGNOSIS — E034 Atrophy of thyroid (acquired): Secondary | ICD-10-CM | POA: Diagnosis not present

## 2017-03-06 DIAGNOSIS — M791 Myalgia: Secondary | ICD-10-CM

## 2017-03-06 DIAGNOSIS — R4182 Altered mental status, unspecified: Secondary | ICD-10-CM

## 2017-03-06 LAB — TSH: TSH: 3.53 m[IU]/L

## 2017-03-06 MED ORDER — TRAMADOL HCL 50 MG PO TABS: 50.0000 mg | ORAL_TABLET | Freq: Four times a day (QID) | ORAL | 5 refills | Status: DC | PRN

## 2017-03-06 MED ORDER — LEVOTHYROXINE SODIUM 50 MCG PO TABS: ORAL_TABLET | ORAL | 12 refills | Status: DC

## 2017-03-06 MED ORDER — TOPIRAMATE 50 MG PO TABS: ORAL_TABLET | ORAL | 6 refills | Status: DC

## 2017-03-06 NOTE — Progress Notes (Signed)
Name: Elizabeth Wyatt   MRN: [id number]    DOB: Apr 08, 1956   Date:03/06/2017       Progress Note  Subjective  Chief Complaint  Chief Complaint  Patient presents with  . Thyroid Problem  . Sore Throat    HPI Here for f/u of hypothyroidism, HBP, weakness, migraine headaches. Chronic pain, depression  No problem-specific Assessment & Plan notes found for this encounter.   Past Medical History:  Diagnosis Date  . Allergy   . Asthma   . Broken rib   . CAD (coronary artery disease)   . Family history of malignant neoplasm of breast   . HLD (hyperlipidemia)   . HTN (hypertension)   . Obesity   . Osteoarthritis   . Personal history of colonic polyps   . Personal history of tobacco use, presenting hazards to health   . Special screening for malignant neoplasms, colon   . TIA (transient ischemic attack)     Past Surgical History:  Procedure Laterality Date  . BREAST BIOPSY Right 2009  . BREAST MASS EXCISION Right 2009  . CARDIAC CATHETERIZATION  2010   Dr. Mariah Milling with [facility]  . COLONOSCOPY  2008, 2014   [facility]  . DILATION AND CURETTAGE OF UTERUS  2012  . MOUTH SURGERY    . MOUTH SURGERY    . UTERINE FIBROID SURGERY  2012    Family History  Problem Relation Age of Onset  . Cancer Maternal Grandmother     cervical and uterine cancer  . Cancer Mother     breast, ovarian/uterine    Social History   Social History  . Marital status: Married    Spouse name: N/A  . Number of children: N/A  . Years of education: N/A   Occupational History  . Not on file.   Social History Main Topics  . Smoking status: Former Smoker    Packs/day: 3.00    Years: 3.00    Types: Cigarettes  . Smokeless tobacco: Never Used     Comment: tobacco use - no   . Alcohol use No     Comment: quit drinking years ago, used to drink on weekends  . Drug use: No  . Sexual activity: Not on file   Other Topics Concern  . Not on file   Social History Narrative   Married, does not get  regular exercise.      Current Outpatient Prescriptions:  .  acetaminophen (TYLENOL) 500 MG tablet, Take 500 mg by mouth every 6 (six) hours as needed., Disp: , Rfl:  .  aspirin 325 MG tablet, Take 325 mg by mouth daily., Disp: , Rfl:  .  atorvastatin (LIPITOR) 10 MG tablet, Take 1 tablet (10 mg total) by mouth daily., Disp: 30 tablet, Rfl: 11 .  cetirizine (ZYRTEC) 10 MG tablet, Take 10 mg by mouth as needed.  , Disp: , Rfl:  .  DULoxetine (CYMBALTA) 30 MG capsule, Take 3 capsules (90 mg total) by mouth daily., Disp: 90 capsule, Rfl: 12 .  etodolac (LODINE) 400 MG tablet, Take 1 tablet (400 mg total) by mouth 2 (two) times daily., Disp: 60 tablet, Rfl: 6 .  FLOVENT HFA 110 MCG/ACT inhaler, IHALE 2 PUFFS BY MOUTH TWICE A DAY (Patient taking differently: IHALE 2 PUFFS BY MOUTH AS NEEDED.), Disp: 12 Inhaler, Rfl: 6 .  hydrOXYzine (ATARAX/VISTARIL) 25 MG tablet, Take 1 tablet (25 mg total) by mouth every 8 (eight) hours as needed for anxiety., Disp: 30 tablet, Rfl: 2 .  nebivolol (BYSTOLIC) 10 MG tablet, Take 1 tablet (10 mg total) by mouth daily., Disp: 90 tablet, Rfl: 3 .  nitroGLYCERIN (NITROSTAT) 0.4 MG SL tablet, Place 0.4 mg under the tongue every 5 (five) minutes as needed., Disp: , Rfl:  .  nystatin (MYCOSTATIN/NYSTOP) 100000 UNIT/GM POWD, One application twice a day to rash, Disp: 1 Bottle, Rfl: 12 .  omeprazole (PRILOSEC) 20 MG capsule, TAKE 1 CAPSULE EVERY DAY, Disp: 90 capsule, Rfl: 3 .  PROAIR HFA 108 (90 BASE) MCG/ACT inhaler, 2 PUFFS PUFF, INHALATION, EVERY 4 HRS AS NEEDED, Disp: 8.5 Inhaler, Rfl: 6 .  topiramate (TOPAMAX) 50 MG tablet, Take 1/2 tablet daily as directed., Disp: 30 tablet, Rfl: 6 .  traMADol (ULTRAM) 50 MG tablet, Take 1 tablet (50 mg total) by mouth every 6 (six) hours as needed., Disp: 120 tablet, Rfl: 5 .  triamcinolone cream (KENALOG) 0.1 %, Apply 1 application topically daily as needed., Disp: , Rfl: 1 .  verapamil (VERELAN PM) 240 MG 24 hr capsule, Take 1  capsule (240 mg total) by mouth daily., Disp: 90 capsule, Rfl: 3 .  levothyroxine (SYNTHROID, LEVOTHROID) 50 MCG tablet, TAKE 1 TABLET (50 MCG TOTAL) BY MOUTH DAILY., Disp: 30 tablet, Rfl: 12 .  LORazepam (ATIVAN) 1 MG tablet, Take 1 mg by mouth as directed., Disp: , Rfl:   Allergies  Allergen Reactions  . Diphenhydramine Hcl     Increased heart rate  . Latex Other (See Comments)    Blisters,then peels skin  . Montelukast Sodium Other (See Comments)    Jittery  . Pamabrom     This medication is in midol and caused anuria  . Simvastatin Other (See Comments)    dizziness  . Sulfonamide Derivatives     vomiting  . Wellbutrin [Bupropion]     Caused major depression and SUICIDAL IDEATION.     Review of Systems  Constitutional: Negative for chills, fever, malaise/fatigue and weight loss.  HENT: Negative for hearing loss and tinnitus.   Eyes: Negative for blurred vision and double vision.  Respiratory: Negative for cough, hemoptysis, shortness of breath and wheezing.   Cardiovascular: Negative for chest pain, palpitations and leg swelling.  Gastrointestinal: Negative for abdominal pain, blood in stool and heartburn.  Genitourinary: Negative for dysuria, frequency and urgency.  Musculoskeletal: Positive for joint pain and myalgias.  Skin: Negative for rash.  Neurological: Positive for weakness. Negative for dizziness, tingling, tremors and headaches.      Objective  Vitals:   03/06/17 0941  BP: (!) 102/59  Pulse: 73  Temp: 98.8 F (37.1 C)  TempSrc: Oral  Weight: 283 lb (128.4 kg)    Physical Exam  Constitutional: She is oriented to person, place, and time and well-developed, well-nourished, and in no distress. No distress.  HENT:  Head: Normocephalic and atraumatic.  Eyes: Conjunctivae and EOM are normal. Pupils are equal, round, and reactive to light. No scleral icterus.  Neck: Normal range of motion. Neck supple. Carotid bruit is not present. No thyromegaly present.   Cardiovascular: Normal rate, regular rhythm and normal heart sounds.  Exam reveals no gallop and no friction rub.   No murmur heard. Pulmonary/Chest: Effort normal and breath sounds normal. No respiratory distress. She has no wheezes. She has no rales.  Abdominal: Soft. Bowel sounds are normal. She exhibits no distension and no mass. There is no tenderness.  Musculoskeletal: She exhibits edema (trace pedal edema.).  Lymphadenopathy:    She has no cervical adenopathy.  Neurological: She is alert  and oriented to person, place, and time.  Vitals reviewed.      No results found for this or any previous visit (from the past 2160 hour(s)).   Assessment & Plan  Problem List Items Addressed This Visit      Cardiovascular and Mediastinum   HYPERTENSION, BENIGN - Primary   Headache, migraine   Relevant Medications   topiramate (TOPAMAX) 50 MG tablet   traMADol (ULTRAM) 50 MG tablet   Coronary artery disease     Endocrine   Hypothyroidism   Relevant Medications   levothyroxine (SYNTHROID, LEVOTHROID) 50 MCG tablet   Other Relevant Orders   TSH     Other   Hyperlipidemia   Morbid obesity (HCC)   Anxiety   Clinical depression   Muscle pain, myofacial   Relevant Medications   traMADol (ULTRAM) 50 MG tablet   Mental status, decreased   Relevant Medications   topiramate (TOPAMAX) 50 MG tablet      Meds ordered this encounter  Medications  . topiramate (TOPAMAX) 50 MG tablet    Sig: Take 1/2 tablet daily as directed.    Dispense:  30 tablet    Refill:  6  . traMADol (ULTRAM) 50 MG tablet    Sig: Take 1 tablet (50 mg total) by mouth every 6 (six) hours as needed.    Dispense:  120 tablet    Refill:  5  . levothyroxine (SYNTHROID, LEVOTHROID) 50 MCG tablet    Sig: TAKE 1 TABLET (50 MCG TOTAL) BY MOUTH DAILY.    Dispense:  30 tablet    Refill:  12   1. HYPERTENSION, BENIGN  Cont med 2. Intractable migraine without aura and without status migrainosus Cont med 3.  Coronary artery disease involving native heart without angina pectoris, unspecified vessel or lesion type   4. Hypothyroidism due to acquired atrophy of thyroid  - TSH - levothyroxine (SYNTHROID, LEVOTHROID) 50 MCG tablet; TAKE 1 TABLET (50 MCG TOTAL) BY MOUTH DAILY.  Dispense: 30 tablet; Refill: 12  5. Hyperlipidemia, unspecified hyperlipidemia type Cont med  6. Morbid obesity (HCC)   7. Moderate episode of recurrent major depressive disorder (HCC) Cont meds  8. Anxiety Cont med  9. Muscle pain, myofacial  - traMADol (ULTRAM) 50 MG tablet; Take 1 tablet (50 mg total) by mouth every 6 (six) hours as needed.  Dispense: 120 tablet; Refill: 5  10. Mental status, decreased  - topiramate (TOPAMAX) 50 MG tablet; Take 1/2 tablet daily as directed.  Dispense: 30 tablet; Refill: 6

## 2017-04-18 ENCOUNTER — Other Ambulatory Visit: Payer: Self-pay

## 2017-04-18 DIAGNOSIS — F331 Major depressive disorder, recurrent, moderate: Secondary | ICD-10-CM

## 2017-04-18 DIAGNOSIS — F4310 Post-traumatic stress disorder, unspecified: Secondary | ICD-10-CM

## 2017-04-18 MED ORDER — DULOXETINE HCL 30 MG PO CPEP: 90.0000 mg | ORAL_CAPSULE | Freq: Every day | ORAL | 11 refills | Status: DC

## 2017-04-18 NOTE — Telephone Encounter (Signed)
Last ov  03/06/17 Last filled 09/08/16

## 2017-04-26 NOTE — Progress Notes (Signed)
Patient ID: Elizabeth Wyatt, female   DOB: 29-Mar-1956, 61 y.o.   MRN: [id number] Cardiology Office Note  Date:  04/27/2017   ID:  Elizabeth Wyatt, DOB Jan 05, 1956, MRN [id number]  PCP:  Smitty Cords, DO   Chief Complaint  Patient presents with  . other    12 month follow up. Meds reviewed by the pt. verbally. Pt. c/o dizziness.     HPI:  Elizabeth Wyatt is a 61 yo woman with PMH of  morbid obesity,  hypertension,  hyperlipidemia,  chronic chest pain  cardiac catheterization May 2009   no significant coronary artery disease (circumflex coming off the ostium of the RCA)  significant stress at home, son who has autism. Separated from her husband Who presents for routine followup of her chest pain symptoms.   Overall she reports she is stable, Presenting a wheelchair, difficulty walking, reports it is her osteoarthritis  She reports having 4 weeks of dizzy, nausea and diarrhea Vertigo, 15 min Nausea No telling when it is going to hit  No dm, no smoking,  Lab work reviewed with her, Lipids at goal Total chol 245 down to 160 LDL 44  EKG personally reviewed by myself on today's visit  shows normal sinus rhythm with rate 78 bpm, no significant ST or T-wave changes   Other past medical history reviewed Previous episode of paralysis, aphasia approximately one month ago. Woke up, could not move, could not open her eyes, could not speak. No focal deficits. She reports that her per minute was working but she could not communicate. After several hours was able to call for help  Reports having similar symptoms many years ago and was kept in the hospital at that time. 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   CT scan of the head showed no pathology She is scheduled to have carotid ultrasound, possibly MRI, follow-up with neurology  Stress test 04/2008:  Cardiac catheter at the same time   PMH:   has a past medical  history of Allergy; Asthma; Broken rib; CAD (coronary artery disease); Family history of malignant neoplasm of breast; HLD (hyperlipidemia); HTN (hypertension); Obesity; Osteoarthritis; Personal history of colonic polyps; Personal history of tobacco use, presenting hazards to health; Special screening for malignant neoplasms, colon; and TIA (transient ischemic attack).  PSH:    Past Surgical History:  Procedure Laterality Date  . BREAST BIOPSY Right 2009  . BREAST MASS EXCISION Right 2009  . CARDIAC CATHETERIZATION  2010   Dr. Mariah Milling with [facility]  . COLONOSCOPY  2008, 2014   [facility]  . DILATION AND CURETTAGE OF UTERUS  2012  . MOUTH SURGERY    . MOUTH SURGERY    . UTERINE FIBROID SURGERY  2012    Current Outpatient Prescriptions  Medication Sig Dispense Refill  . acetaminophen (TYLENOL) 500 MG tablet Take 500 mg by mouth every 6 (six) hours as needed.    Marland Kitchen aspirin 325 MG tablet Take 325 mg by mouth daily.    Marland Kitchen atorvastatin (LIPITOR) 10 MG tablet Take 1 tablet (10 mg total) by mouth daily. 30 tablet 11  . cetirizine (ZYRTEC) 10 MG tablet Take 10 mg by mouth as needed.      . DULoxetine (CYMBALTA) 30 MG capsule Take 3 capsules (90 mg total) by mouth daily. 90 capsule 11  . etodolac (LODINE) 400 MG tablet Take 1 tablet (400 mg total) by mouth 2 (two) times daily. 60 tablet 6  .  FLOVENT HFA 110 MCG/ACT inhaler IHALE 2 PUFFS BY MOUTH TWICE A DAY (Patient taking differently: IHALE 2 PUFFS BY MOUTH AS NEEDED.) 12 Inhaler 6  . hydrOXYzine (ATARAX/VISTARIL) 25 MG tablet Take 1 tablet (25 mg total) by mouth every 8 (eight) hours as needed for anxiety. 30 tablet 2  . levothyroxine (SYNTHROID, LEVOTHROID) 50 MCG tablet TAKE 1 TABLET (50 MCG TOTAL) BY MOUTH DAILY. 30 tablet 12  . LORazepam (ATIVAN) 1 MG tablet Take 1 mg by mouth as directed.    . nebivolol (BYSTOLIC) 10 MG tablet Take 1 tablet (10 mg total) by mouth daily. 90 tablet 3  . nitroGLYCERIN (NITROSTAT) 0.4 MG SL tablet Place 0.4 mg under the  tongue every 5 (five) minutes as needed.    . nystatin (MYCOSTATIN/NYSTOP) 100000 UNIT/GM POWD One application twice a day to rash 1 Bottle 12  . omeprazole (PRILOSEC) 20 MG capsule TAKE 1 CAPSULE EVERY DAY 90 capsule 3  . PROAIR HFA 108 (90 BASE) MCG/ACT inhaler 2 PUFFS PUFF, INHALATION, EVERY 4 HRS AS NEEDED 8.5 Inhaler 6  . topiramate (TOPAMAX) 50 MG tablet Take 1/2 tablet daily as directed. 30 tablet 6  . traMADol (ULTRAM) 50 MG tablet Take 1 tablet (50 mg total) by mouth every 6 (six) hours as needed. 120 tablet 5  . triamcinolone cream (KENALOG) 0.1 % Apply 1 application topically daily as needed.  1  . verapamil (VERELAN PM) 240 MG 24 hr capsule Take 1 capsule (240 mg total) by mouth daily. 90 capsule 3  . meclizine (ANTIVERT) 25 MG tablet Take 1 tablet (25 mg total) by mouth 3 (three) times daily as needed. 60 tablet 1   No current facility-administered medications for this visit.      Allergies:   Diphenhydramine hcl; Latex; Montelukast sodium; Pamabrom; Simvastatin; Sulfonamide derivatives; and Wellbutrin [bupropion]   Social History:  The patient  reports that she has quit smoking. Her smoking use included Cigarettes. She has a 9.00 pack-year smoking history. She has never used smokeless tobacco. She reports that she does not drink alcohol or use drugs.   Family History:   family history includes Cancer in her maternal grandmother and mother.    Review of Systems: Review of Systems  Constitutional: Negative.   Respiratory: Negative.   Cardiovascular: Negative.   Gastrointestinal: Negative.   Musculoskeletal: Positive for back pain and joint pain.       Difficulty walking  Neurological: Negative.   Psychiatric/Behavioral: Negative.   All other systems reviewed and are negative.    PHYSICAL EXAM: VS:  BP 124/64 (BP Location: Left Arm, Patient Position: Sitting, Cuff Size: Large)   Pulse 78   Ht 5' (1.524 m)   Wt 276 lb 12 oz (125.5 kg)   BMI 54.05 kg/m  , BMI Body  mass index is 54.05 kg/m. GEN: Well nourished, well developed, in no acute distress , Morbidly obese, presenting in a wheelchair HEENT: normal  Neck: no JVD, carotid bruits, or masses Cardiac: RRR; no murmurs, rubs, or gallops,no edema  Respiratory:  clear to auscultation bilaterally, normal work of breathing GI: soft, nontender, nondistended, + BS MS: no deformity or atrophy  Skin: warm and dry, no rash Neuro:  Strength and sensation are intact Psych: euthymic mood, full affect    Recent Labs: 03/06/2017: TSH 3.53    Lipid Panel Lab Results  Component Value Date   CHOL 164 04/15/2016   HDL 94 04/15/2016   LDLCALC 44 04/15/2016   TRIG 128 04/15/2016  Wt Readings from Last 3 Encounters:  04/27/17 276 lb 12 oz (125.5 kg)  03/06/17 283 lb (128.4 kg)  02/23/17 290 lb (131.5 kg)       ASSESSMENT AND PLAN:  Other specified transient cerebral ischemias - Previous neurologic symptoms on specific Did not sound particularly like a TIA with focal deficits No further episodes since that time  HYPERTENSION, BENIGN - Plan: EKG 12-Lead Blood pressure is well controlled on today's visit. No changes made to the medications.  Hyperlipidemia Cholesterol is at goal on the current lipid regimen. No changes to the medications were made.  Atypical chest pain Denies having any recent chest pain symptoms No further testing Previous catheterization with no significant buildup  Morbid obesity due to excess calories (HCC) Poor diet, no regular exercise program Reports she is limited by arthritis Major issue causing her debilitated state. Presenting a wheelchair today  ADJ DISORDER WITH MIXED ANXIETY & DEPRESSED MOOD Prior history of Significant stress at home,  separated from her husband, disabled child   Total encounter time more than 25 minutes  Greater than 50% was spent in counseling and coordination of care with the patient  Disposition:   F/U  prn   Orders Placed  This Encounter  Procedures  . EKG 12-Lead     Signed, Dossie Arbour, M.D., Ph.D. 04/27/2017  [facility] [place], [place] [phone]

## 2017-04-27 ENCOUNTER — Encounter: Payer: Self-pay | Admitting: Cardiovascular Disease

## 2017-04-27 ENCOUNTER — Ambulatory Visit (INDEPENDENT_AMBULATORY_CARE_PROVIDER_SITE_OTHER): Payer: 59 | Admitting: Cardiovascular Disease

## 2017-04-27 VITALS — BP 124/64 | HR 78 | Ht 60.0 in | Wt 276.8 lb

## 2017-04-27 DIAGNOSIS — I25118 Atherosclerotic heart disease of native coronary artery with other forms of angina pectoris: Secondary | ICD-10-CM | POA: Diagnosis not present

## 2017-04-27 DIAGNOSIS — E782 Mixed hyperlipidemia: Secondary | ICD-10-CM

## 2017-04-27 DIAGNOSIS — I10 Essential (primary) hypertension: Secondary | ICD-10-CM

## 2017-04-27 DIAGNOSIS — F4323 Adjustment disorder with mixed anxiety and depressed mood: Secondary | ICD-10-CM

## 2017-04-27 MED ORDER — MECLIZINE HCL 25 MG PO TABS: 25.0000 mg | ORAL_TABLET | Freq: Three times a day (TID) | ORAL | 1 refills | Status: DC | PRN

## 2017-04-27 NOTE — Patient Instructions (Addendum)
Medication Instructions:   Please try meclizine as needed for vertigo/nausea  Labwork:  No new labs needed  Testing/Procedures:  No further testing at this time   I recommend watching educational videos on topics of interest to you at:       www.goemmi.com  Enter code: HEARTCARE    Follow-Up: It was a pleasure seeing you in the office today. Please call us if you have new issues that need to be addressed before your next appt.  [phone]  Your physician wants you to follow-up in: 12 months as needed.  You will receive a reminder letter in the mail two months in advance. If you don't receive a letter, please call our office to schedule the follow-up appointment.  If you need a refill on your cardiac medications before your next appointment, please call your pharmacy.

## 2017-08-23 ENCOUNTER — Encounter: Payer: Self-pay | Admitting: General Surgery

## 2017-08-23 ENCOUNTER — Ambulatory Visit (INDEPENDENT_AMBULATORY_CARE_PROVIDER_SITE_OTHER): Payer: 59 | Admitting: General Surgery

## 2017-08-23 VITALS — BP 132/74 | HR 70 | Resp 12 | Ht 59.0 in | Wt 269.0 lb

## 2017-08-23 DIAGNOSIS — N6082 Other benign mammary dysplasias of left breast: Secondary | ICD-10-CM | POA: Diagnosis not present

## 2017-08-23 DIAGNOSIS — Z8041 Family history of malignant neoplasm of ovary: Secondary | ICD-10-CM

## 2017-08-23 DIAGNOSIS — Z803 Family history of malignant neoplasm of breast: Secondary | ICD-10-CM | POA: Diagnosis not present

## 2017-08-23 NOTE — Patient Instructions (Signed)
Patient to followup as scheduled.

## 2017-08-23 NOTE — Progress Notes (Signed)
Patient ID: Elizabeth Wyatt, female   DOB: [birth year], 61 y.o.   MRN: [id number]  Chief Complaint  Patient presents with  . Other    HPI Elizabeth Wyatt is a 61 y.o. female here today for a evaluation of a left breast mass. Patient states she noticed this area this area about one weeks . No pain but has changed in size.   HPI  Past Medical History:  Diagnosis Date  . Allergy   . Asthma   . Broken rib   . CAD (coronary artery disease)   . Family history of malignant neoplasm of breast   . HLD (hyperlipidemia)   . HTN (hypertension)   . Obesity   . Osteoarthritis   . Personal history of colonic polyps   . Personal history of tobacco use, presenting hazards to health   . Special screening for malignant neoplasms, colon   . TIA (transient ischemic attack)     Past Surgical History:  Procedure Laterality Date  . BREAST BIOPSY Right 2009  . BREAST MASS EXCISION Right 2009  . CARDIAC CATHETERIZATION  2010   Dr. Mariah MillingGollan with [facility]  . COLONOSCOPY  2008, 2014   [facility]  . DILATION AND CURETTAGE OF UTERUS  2012  . MOUTH SURGERY    . MOUTH SURGERY    . UTERINE FIBROID SURGERY  2012    Family History  Problem Relation Age of Onset  . Cancer Mother        breast, ovarian/uterine  . Cancer Maternal Grandmother        cervical and uterine cancer    Social History Social History  Substance Use Topics  . Smoking status: Former Smoker    Packs/day: 3.00    Years: 3.00    Types: Cigarettes  . Smokeless tobacco: Never Used     Comment: tobacco use - no   . Alcohol use No     Comment: quit drinking years ago, used to drink on weekends    Allergies  Allergen Reactions  . Diphenhydramine Hcl     Increased heart rate  . Latex Other (See Comments)    Blisters,then peels skin  . Montelukast Sodium Other (See Comments)    Jittery  . Pamabrom     This medication is in midol and caused anuria  . Simvastatin Other (See Comments)    dizziness  . Sulfonamide Derivatives    vomiting  . Wellbutrin [Bupropion]     Caused major depression and SUICIDAL IDEATION.    Current Outpatient Prescriptions  Medication Sig Dispense Refill  . acetaminophen (TYLENOL) 500 MG tablet Take 500 mg by mouth every 6 (six) hours as needed.    Marland Kitchen. aspirin 325 MG tablet Take 325 mg by mouth daily.    Marland Kitchen. atorvastatin (LIPITOR) 10 MG tablet Take 1 tablet (10 mg total) by mouth daily. 30 tablet 11  . cetirizine (ZYRTEC) 10 MG tablet Take 10 mg by mouth as needed.      . DULoxetine (CYMBALTA) 30 MG capsule Take 3 capsules (90 mg total) by mouth daily. 90 capsule 11  . etodolac (LODINE) 400 MG tablet Take 1 tablet (400 mg total) by mouth 2 (two) times daily. 60 tablet 6  . FLOVENT HFA 110 MCG/ACT inhaler IHALE 2 PUFFS BY MOUTH TWICE A DAY (Patient taking differently: IHALE 2 PUFFS BY MOUTH AS NEEDED.) 12 Inhaler 6  . hydrOXYzine (ATARAX/VISTARIL) 25 MG tablet Take 1 tablet (25 mg total) by mouth every 8 (eight) hours as needed  for anxiety. 30 tablet 2  . levothyroxine (SYNTHROID, LEVOTHROID) 50 MCG tablet TAKE 1 TABLET (50 MCG TOTAL) BY MOUTH DAILY. 30 tablet 12  . LORazepam (ATIVAN) 1 MG tablet Take 1 mg by mouth as directed.    . meclizine (ANTIVERT) 25 MG tablet Take 1 tablet (25 mg total) by mouth 3 (three) times daily as needed. 60 tablet 1  . nebivolol (BYSTOLIC) 10 MG tablet Take 1 tablet (10 mg total) by mouth daily. 90 tablet 3  . nitroGLYCERIN (NITROSTAT) 0.4 MG SL tablet Place 0.4 mg under the tongue every 5 (five) minutes as needed.    . nystatin (MYCOSTATIN/NYSTOP) 100000 UNIT/GM POWD One application twice a day to rash 1 Bottle 12  . omeprazole (PRILOSEC) 20 MG capsule TAKE 1 CAPSULE EVERY DAY 90 capsule 3  . PROAIR HFA 108 (90 BASE) MCG/ACT inhaler 2 PUFFS PUFF, INHALATION, EVERY 4 HRS AS NEEDED 8.5 Inhaler 6  . topiramate (TOPAMAX) 50 MG tablet Take 1/2 tablet daily as directed. 30 tablet 6  . traMADol (ULTRAM) 50 MG tablet Take 1 tablet (50 mg total) by mouth every 6 (six) hours  as needed. 120 tablet 5  . triamcinolone cream (KENALOG) 0.1 % Apply 1 application topically daily as needed.  1  . verapamil (VERELAN PM) 240 MG 24 hr capsule Take 1 capsule (240 mg total) by mouth daily. 90 capsule 3   No current facility-administered medications for this visit.     Review of Systems Review of Systems  Constitutional: Negative.   Respiratory: Negative.   Cardiovascular: Negative.     Blood pressure 132/74, pulse 70, resp. rate 12, height  (1.499 m), weight 269 lb (122 kg).  Physical Exam Physical Exam  Constitutional: She is oriented to person, place, and time. She appears well-developed and well-nourished.  Pulmonary/Chest: Left breast exhibits no inverted nipple, no mass, no nipple discharge, no skin change and no tenderness.    Neurological: She is alert and oriented to person, place, and time.  Skin: Skin is warm and dry.    Data Reviewed Prior notes reviewed   Assessment    Small skin cyst left breast appears mildly inflamed. By her history this appears to be resolving and do not feel she requires any intervention at this time    Plan    Patient to follow up as scheduled. The patient is aware to call back for any questions or concerns.  HPI, Physical Exam, Assessment and Plan have been scribed under the direction and in the presence of Kathreen Cosier, MD  Ples Specter, CMA      I have completed the exam and reviewed the above documentation for accuracy and completeness.  I agree with the above.  Museum/gallery conservator has been used and any errors in dictation or transcription are unintentional.  Jim Philemon G. Evette Cristal, M.D., F.A.C.S.   Gerlene Burdock G 08/24/2017, 10:05 AM

## 2017-08-31 ENCOUNTER — Ambulatory Visit
Admission: RE | Admit: 2017-08-31 | Discharge: 2017-08-31 | Disposition: A | Discharge status: Home / Self Care | Payer: 59 | Source: Ambulatory Visit | Attending: Family Medicine | Admitting: Family Medicine

## 2017-08-31 ENCOUNTER — Ambulatory Visit (INDEPENDENT_AMBULATORY_CARE_PROVIDER_SITE_OTHER): Payer: 59 | Admitting: Family Medicine

## 2017-08-31 ENCOUNTER — Encounter: Payer: Self-pay | Admitting: Family Medicine

## 2017-08-31 VITALS — BP 120/74 | HR 78 | Temp 99.5°F | Resp 16 | Ht 59.0 in | Wt 266.8 lb

## 2017-08-31 DIAGNOSIS — J209 Acute bronchitis, unspecified: Secondary | ICD-10-CM

## 2017-08-31 MED ORDER — AZITHROMYCIN 250 MG PO TABS: ORAL_TABLET | ORAL | 0 refills | Status: DC

## 2017-08-31 MED ORDER — BENZONATATE 100 MG PO CAPS: 100.0000 mg | ORAL_CAPSULE | Freq: Three times a day (TID) | ORAL | 0 refills | Status: DC | PRN

## 2017-08-31 NOTE — Patient Instructions (Addendum)
Thank you for coming to the clinic today.  1.   It sounds like you had an Upper Respiratory Virus that has settled into a Bronchitis, lower respiratory tract infection. I don't have concerns for pneumonia today, and think that this should gradually improve. Once you are feeling better, the cough may take a few weeks to fully resolve.  Check Chest x-ray will call with results   start Azithromycin Z pak (antibiotic) 2 tabs day 1, then 1 tab x 4 days, complete entire course even if improved  Start Tessalon Perls take 1 capsule up to 3 times a day as needed for cough  - Use nasal saline (Simply Saline or Ocean Spray) to flush nasal congestion multiple times a day, may help cough  - Drink plenty of fluids to improve congestion  If your symptoms seem to worsen instead of improve over next several days, including significant fever / chills, worsening shortness of breath, worsening wheezing, or nausea / vomiting and can't take medicines - return sooner or go to hospital Emergency Department for more immediate treatment.  Please schedule a Follow-up Appointment to: Return in about 1 week (around 09/07/2017), or if symptoms worsen or fail to improve, for bronchitis.  If you have any other questions or concerns, please feel free to call the clinic or send a message through MyChart. You may also schedule an earlier appointment if necessary.  Additionally, you may be receiving a survey about your experience at our clinic within a few days to 1 week by e-mail or mail. We value your feedback.  Saralyn Pilar, DO [facility], [place]

## 2017-08-31 NOTE — Progress Notes (Addendum)
Subjective:    Patient ID: Elizabeth Wyatt, female    DOB: 1956/06/04, 61 y.o.   MRN: [id number]  Elizabeth Wyatt is a 61 y.o. female presenting on 08/31/2017 for Cough (onset 7 days greenish mucus, chest congestion, Hx of bronchitis, nasal congestion, HA, ear pain, throat sore from coughing)  Patient presents for a same day appointment.  HPI   URI BRONCHITIS - Reports new worsening concern today with URI cough symptoms for several days, then worsening over past 1-2 days with transition of sputum from white thicker to green and yellow thicker, associated with headache and low grade fevers with some chills. Also sinus pain and pressure, bilateral ear ache, prior R TM perforation with tymp tubes - No recent antibiotics - No known sick contacts - She does not take OTC cold medicines due to BP and heart conditions. She uses occasional Tylenol and Ibuprofen. - Tolerates azithromycin well, rarely has used Prednisone usually due to allergy, not breathing. 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, and her estranged husband consumes heavy amounts of alcohol and limited support in her life  Depression screen [facility] 2/9 05/17/2016 04/14/2016 01/22/2016  Decreased Interest 1 1 0  Down, Depressed, Hopeless 1 1 0  PHQ - 2 Score 2 2 0  Altered sleeping 1 1 -  Tired, decreased energy 1 1 -  Change in appetite 1 1 -  Feeling bad or failure about yourself  1 1 -  Trouble concentrating 1 1 -  Moving slowly or fidgety/restless 1 1 -  Suicidal thoughts 1 0 -  PHQ-9 Score 9 8 -  Difficult doing work/chores Very difficult Very difficult -    Social History  Substance Use Topics  . Smoking status: Former Smoker    Packs/day: 3.00    Years: 3.00     Types: Cigarettes    Quit date: 1993  . Smokeless tobacco: Former Neurosurgeon     Comment: tobacco use - no   . Alcohol use No     Comment: quit drinking years ago, used to drink on weekends    Review of Systems Per HPI unless specifically indicated above     Objective:    BP 120/74   Pulse 78   Temp 99.5 F (37.5 C) (Oral)   Resp 16   Ht  (1.499 m)   Wt 266 lb 12.8 oz (121 kg)   SpO2 93%   BMI 53.89 kg/m   Wt Readings from Last 3 Encounters:  08/31/17 266 lb 12.8 oz (121 kg)  08/23/17 269 lb (122 kg)  04/27/17 276 lb 12 oz (125.5 kg)    Physical Exam  Constitutional: She is oriented to person, place, and time. She appears well-developed and well-nourished. No distress.  Mildly ill appearing, comfortable, cooperative, obese  HENT:  Head: Normocephalic and atraumatic.  Mouth/Throat: Oropharynx is clear and moist.  Frontal / maxillary sinuses non-tender. Nares patent with congestion without purulence.   R TM with old perforation scar from tymp tube. L TM with old scarring and otherwise normal with mild clear effusion fullness without erythema, or bulging  Oropharynx non specific posterior pharyngeal erythema with drainage without exudates, asymmetry.  Eyes: Conjunctivae are normal. Right eye exhibits no discharge. Left eye exhibits  no discharge.  Neck: Normal range of motion. Neck supple. No thyromegaly present.  Cardiovascular: Normal rate, regular rhythm, normal heart sounds and intact distal pulses.   No murmur heard. Pulmonary/Chest: Effort normal. No respiratory distress. She has no wheezes. She has no rales.  Mild reduced air movement bilateral, possibly due to large body habitus. Some rhonchi that improve with cough, lower bases.  Lymphadenopathy:    She has cervical adenopathy.  Neurological: She is alert and oriented to person, place, and time.  Skin: Skin is warm and dry. No rash noted. She is not diaphoretic. No erythema.  Psychiatric: Her behavior is  normal.  Nursing note and vitals reviewed.  I have personally reviewed the radiology report from Chest X-ray on 08/31/17.  CLINICAL DATA:  Cough for 1 week.  Fever.  Shortness of breath.  EXAM: CHEST  2 VIEW  COMPARISON:  None.  FINDINGS: Lungs are clear. Heart size and pulmonary vascularity are normal. No adenopathy. No bone lesions.  IMPRESSION: No edema or consolidation.   Electronically Signed   By: Bretta Bang III M.D.   On: 08/31/2017 15:23  Results for orders placed or performed in visit on 03/06/17  TSH  Result Value Ref Range   TSH 3.53 mIU/L      Assessment & Plan:   Problem List Items Addressed This Visit    None    Visit Diagnoses    Acute bronchitis, unspecified organism    -  Primary  Consistent with worsening bronchitis in setting of likely viral URI - Concern with duration >1 week, other comorbidities and prior history of pneumonia with similar - Afebrile (but subjective fever), no other focal sign of infection, cannot rule out pulmonary etiology PNA. Marland Kitchen  Plan: 1. Check CXR today - pending result will adjust antibiotic 2. Start Azithromycin Z-pak dosing  then  daily x 4 days 3.  Rx Tessalon Perls for cough PRN 4. Supportive care OTC - she does not take mucinex or other medicines, may take Tylenol/NSAID PRN, improve hydration 5. Return criteria reviewed, follow-up within 1 week if not improved  **UPDATE 9/20 1535 - Reviewed CXR results. Normal. No evidence pneumonia or fluid or other cardiopulm abnormality - Patient to be notified, continue azithromycin, no change to treatment     Relevant Medications   azithromycin (ZITHROMAX Z-PAK) 250 MG tablet   benzonatate (TESSALON) 100 MG capsule   Other Relevant Orders   DG Chest 2 View      Meds ordered this encounter  Medications  . fluconazole (DIFLUCAN) 200 MG tablet  . azithromycin (ZITHROMAX Z-PAK) 250 MG tablet    Sig: Take 2 tabs (  total) on Day 1. Take 1 tab  ( ) daily for next 4 days.    Dispense:  6 tablet    Refill:  0  . benzonatate (TESSALON) 100 MG capsule    Sig: Take 1 capsule (100 mg total) by mouth 3 (three) times daily as needed for cough.    Dispense:  30 capsule    Refill:  0    Follow up plan: Return in about 1 week (around 09/07/2017), or if symptoms worsen or fail to improve, for bronchitis.  Patient advised to call office with future plans of follow-up when well for upcoming labs will need to be ordered.  Also return for Flu Shot within 2 weeks, if afebrile and improved.  Saralyn Pilar, DO [facility] 08/31/2017, 12:43 PM

## 2017-09-04 ENCOUNTER — Other Ambulatory Visit: Payer: Self-pay

## 2017-09-30 ENCOUNTER — Other Ambulatory Visit: Payer: Self-pay | Admitting: Family Medicine

## 2017-09-30 DIAGNOSIS — E785 Hyperlipidemia, unspecified: Secondary | ICD-10-CM

## 2017-09-30 DIAGNOSIS — I10 Essential (primary) hypertension: Secondary | ICD-10-CM

## 2017-10-17 ENCOUNTER — Ambulatory Visit (INDEPENDENT_AMBULATORY_CARE_PROVIDER_SITE_OTHER): Payer: Self-pay | Admitting: Family Medicine

## 2017-10-17 ENCOUNTER — Other Ambulatory Visit: Payer: Self-pay

## 2017-10-17 ENCOUNTER — Encounter: Payer: Self-pay | Admitting: Family Medicine

## 2017-10-17 VITALS — BP 128/74 | HR 79 | Temp 99.8°F | Ht 59.0 in | Wt 262.8 lb

## 2017-10-17 DIAGNOSIS — F4310 Post-traumatic stress disorder, unspecified: Secondary | ICD-10-CM

## 2017-10-17 DIAGNOSIS — M7989 Other specified soft tissue disorders: Secondary | ICD-10-CM

## 2017-10-17 DIAGNOSIS — M7122 Synovial cyst of popliteal space [Baker], left knee: Secondary | ICD-10-CM

## 2017-10-17 DIAGNOSIS — Z23 Encounter for immunization: Secondary | ICD-10-CM

## 2017-10-17 DIAGNOSIS — F331 Major depressive disorder, recurrent, moderate: Secondary | ICD-10-CM

## 2017-10-17 DIAGNOSIS — M25562 Pain in left knee: Secondary | ICD-10-CM

## 2017-10-17 MED ORDER — FUROSEMIDE 20 MG PO TABS: 20.0000 mg | ORAL_TABLET | Freq: Every day | ORAL | 0 refills | Status: DC

## 2017-10-17 MED ORDER — DULOXETINE HCL 30 MG PO CPEP: 90.0000 mg | ORAL_CAPSULE | Freq: Every day | ORAL | 3 refills | Status: DC

## 2017-10-17 MED ORDER — DULOXETINE HCL 30 MG PO CPEP: 90.0000 mg | ORAL_CAPSULE | Freq: Every day | ORAL | 5 refills | Status: DC

## 2017-10-17 NOTE — Progress Notes (Signed)
Subjective:    Patient ID: Elizabeth Wyatt, female    DOB: 1956-10-08, 61 y.o.   MRN: [id number]  Elizabeth Wyatt is a 61 y.o. female presenting on 10/17/2017 for Leg Swelling (numbness ,swelling, and SOB in the leg x 7 hrs ago. Pt been off of ASA x 1 week )  Patient presents for a same day appointment.  HPI   Left Leg Pain / Swelling - Left Posterior Knee - Reports woke up this morning with left leg with mild pain behind knee and mostly stiffness and heaviness with leg, difficulty noticed with trying to get out of bed by rolling her left leg did not move well, states it was limited by pain and stiffness with swelling, but was not numb or not without sensation. She tried to prop leg up a little bit in bed. No new injury or problem with leg/knee. She has been less active at home recently does not leave house but does ambulate some indoors - No new medicines for it today. Did not try ice or wrap. Does not have knee sleeve - History of urinary incontinence in past, she has been in diuretic in past and did not want to take this due to urinary frequency on medicine. 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 - Admits some Left leg swelling - Denies any redness, dyspnea, numbness, tingling, weakness  Health Maintenance: -Due for Flu Shot, will receive today  Depression screen [facility] 2/9 05/17/2016 04/14/2016 01/22/2016  Decreased Interest 1 1 0  Down, Depressed, Hopeless 1 1 0  PHQ - 2 Score 2 2 0  Altered sleeping 1 1 -  Tired, decreased energy 1 1 -  Change in appetite 1 1 -  Feeling bad or failure about yourself  1 1 -  Trouble concentrating 1 1 -  Moving slowly or  fidgety/restless 1 1 -  Suicidal thoughts 1 0 -  PHQ-9 Score 9 8 -  Difficult doing work/chores Very difficult Very difficult -    Social History   Tobacco Use  . Smoking status: Former Smoker    Packs/day: 3.00    Years: 3.00    Pack years: 9.00    Types: Cigarettes    Last attempt to quit: 1993    Years since quitting: 25.8  . Smokeless tobacco: Former Neurosurgeon  . Tobacco comment: tobacco use - no   Substance Use Topics  . Alcohol use: No    Comment: quit drinking years ago, used to drink on weekends  . Drug use: No    Review of Systems Per HPI unless specifically indicated above     Objective:    BP 128/74 (BP Location: Left Arm, Patient Position: Sitting, Cuff Size: Large)   Pulse 79   Temp 99.8 F (37.7 C) (Oral)   Ht 4\' 11"  (1.499 m)   Wt 262 lb 12.8 oz (119.2 kg)   SpO2 98%   BMI 53.08 kg/m   Wt Readings from Last 3 Encounters:  10/17/17 262 lb 12.8 oz (119.2 kg)  08/31/17 266 lb 12.8 oz (121 kg)  08/23/17 269 lb (122 kg)    Physical Exam  Constitutional: She is oriented to person, place, and time. She appears well-developed and well-nourished. No distress.  Well-appearing, mostly comfortable, cooperative, morbidly obese  HENT:  Head: Normocephalic and atraumatic.  Mouth/Throat: Oropharynx is clear and moist.  Cardiovascular: Normal rate, regular rhythm, normal heart sounds and intact distal pulses.  No murmur heard. Pulmonary/Chest: Effort normal and breath sounds normal. No respiratory distress. She has no wheezes. She has no rales.  Musculoskeletal:  Bilateral Knees Inspection: Bilateral bulky appearing knees due to large body habitus, L knee slightly increased soft tissue edema vs some effusion, also large cystlike appearance posterior knee with edema. No ecchymosis or erythema. Palpation: Mild +TTP Left knee only bilateral medial > lateral joint line. No crepitus. Large palpable tender baker's cyst L posterior knee, no fluctuance of fluid or  warmth. ROM: Reduced L knee ROM limited flexion, improved. R knee normal ROM Special Testing: Lachman / Valgus/Varus tests negative with intact ligaments (ACL, MCL, LCL) Strength: 5/5 intact knee flex/ext, ankle dorsi/plantarflex Neurovascular: distally intact sensation light touch and pulses  Lower Extremity without edema lower leg/calves, no asymmetry, no erythema, non tender.  Neurological: She is alert and oriented to person, place, and time.  Skin: Skin is warm and dry. No rash noted. She is not diaphoretic. No erythema.  Psychiatric: Her behavior is normal.  Nursing note and vitals reviewed.  Results for orders placed or performed in visit on 03/06/17  TSH  Result Value Ref Range   TSH 3.53 mIU/L      Assessment & Plan:   Problem List Items Addressed This Visit    Baker's cyst of knee, left - Primary    Most likely L knee baker's cyst with acute L posterior Knee pain with some radiation and swelling of knee / posteriorly worse, without known injury or trauma. Improved ROM now - Known knee OA/DJD. Suspected likely due to underlying osteoarthritis / DJD - Considered but less likely meniscus or ligament injury, without mechanism, cannot rule out degenerative problem - Able to bear weight, no knee instability - No prior history of knee surgery, arthroscopy or injection - Inadequate conservative therapy - based on exam and history less likely DVT, Well's Score 0, some relative immobility, offered to check US Doppler but she declines imaging and further work-up  Plan: 1. Reassurance, counseling on dx and management likely complication of OA/DJD 2. Start Tylenol 500-1000mg  per dose TID PRN, may use NSAID PRN 3. RICE therapy (rest, ice, compression ace wrap or knee sleeve, elevation) for swelling, activity modification - Printed rx Lasix 20mg  daily x 3 days PRN only if swelling worse, may fill if need 4. Deferred repeat Knee x-rays reconsider if not improve 5. Follow-up 2 weeks, if  still worsening, consider steroid injection +/- drain fluid aspiration knee, and referral to Ortho for further eval may need x-ray vs advanced imaging       Other Visit Diagnoses    Leg swelling       Relevant Medications   furosemide (LASIX) 20 MG tablet   Needs flu shot       Relevant Orders   Flu Vaccine QUAD 6+ mos PF IM (Fluarix Quad PF) (Completed)   Acute pain of left knee          Meds ordered this encounter  Medications  . furosemide (LASIX) 20 MG tablet    Sig: Take 1 tablet (20 mg total) daily by mouth. For up to 3 days as needed for leg swelling    Dispense:  10  tablet    Refill:  0    Follow up plan: Return in about 2 weeks (around 10/31/2017), or if symptoms worsen or fail to improve, for Leg / knee pain swelling.  Saralyn PilarAlexander Karamalegos, DO [facility] 10/18/2017, 12:33 AM

## 2017-10-17 NOTE — Patient Instructions (Addendum)
Thank you for coming to the clinic today.  I think this is most likely a Baker's Cyst behind the knee with swelling secondary to arthritis  Recommend to start taking Tylenol Extra Strength 500mg  tabs - take 1 to 2 tabs per dose (max 1000mg ) every 6-8 hours for pain (take regularly, don't skip a dose for next 7 days), max 24 hour daily dose is 6 tablets or 3000mg . In the future you can repeat the same everyday Tylenol course for 1-2 weeks at a time.   Use RICE therapy: - R - Rest / relative rest with activity modification avoid overuse of joint - I - Ice packs (make sure you use a towel or sock / something to protect skin) - C - Compression with flexible Knee Sleeve / ACE wrap to apply pressure and reduce swelling allowing more support - E - Elevation - if significant swelling, lift leg above heart level (toes above your nose) to help reduce swelling, most helpful at night after day of being on your feet  If swelling is not improved, you may try to the Lasix Furosemide 20mg  once daily for 2-3 days to help reduce fluid  If pain is persistent but but swelling is improved, we can try a steroid injection in office if you are interested.  Lastly we may need referral to Orthopedics if need other assistance with Left Knee Joint Pain.  Please schedule a Follow-up Appointment to: Return in about 2 weeks (around 10/31/2017), or if symptoms worsen or fail to improve, for Leg / knee pain swelling.  If you have any other questions or concerns, please feel free to call the clinic or send a message through MyChart. You may also schedule an earlier appointment if necessary.  Additionally, you may be receiving a survey about your experience at our clinic within a few days to 1 week by e-mail or mail. We value your feedback.  Saralyn PilarAlexander Karamalegos, DO [facility], [place]

## 2017-10-17 NOTE — Addendum Note (Signed)
Addended by: Smitty CordsKARAMALEGOS, Marijke Guadiana J on: 10/17/2017 03:36 PM   Modules accepted: Orders

## 2017-10-18 ENCOUNTER — Encounter: Payer: Self-pay | Admitting: Family Medicine

## 2017-10-18 DIAGNOSIS — M7122 Synovial cyst of popliteal space [Baker], left knee: Secondary | ICD-10-CM | POA: Insufficient documentation

## 2017-10-18 NOTE — Assessment & Plan Note (Addendum)
Most likely L knee baker's cyst with acute L posterior Knee pain with some radiation and swelling of knee / posteriorly worse, without known injury or trauma. Improved ROM now - Known knee OA/DJD. Suspected likely due to underlying osteoarthritis / DJD - Considered but less likely meniscus or ligament injury, without mechanism, cannot rule out degenerative problem - Able to bear weight, no knee instability - No prior history of knee surgery, arthroscopy or injection - Inadequate conservative therapy - based on exam and history less likely DVT, Well's Score 0, some relative immobility, offered to check US Doppler but she declines imaging and further work-up  Plan: 1. Reassurance, counseling on dx and management likely complication of OA/DJD 2. Start Tylenol 500-1000mg  per dose TID PRN, may use NSAID PRN 3. RICE therapy (rest, ice, compression ace wrap or knee sleeve, elevation) for swelling, activity modification - Printed rx Lasix 20mg  daily x 3 days PRN only if swelling worse, may fill if need 4. Deferred repeat Knee x-rays reconsider if not improve 5. Follow-up 2 weeks, if still worsening, consider steroid injection +/- drain fluid aspiration knee, and referral to Ortho for further eval may need x-ray vs advanced imaging

## 2017-12-11 ENCOUNTER — Other Ambulatory Visit: Payer: Self-pay | Admitting: Family Medicine

## 2017-12-11 DIAGNOSIS — F331 Major depressive disorder, recurrent, moderate: Secondary | ICD-10-CM

## 2018-01-15 ENCOUNTER — Encounter: Payer: Self-pay | Admitting: *Deleted

## 2018-01-26 ENCOUNTER — Other Ambulatory Visit: Payer: Self-pay | Admitting: Family Medicine

## 2018-01-26 DIAGNOSIS — M7989 Other specified soft tissue disorders: Secondary | ICD-10-CM

## 2018-01-26 MED ORDER — FUROSEMIDE 20 MG PO TABS: 20.0000 mg | ORAL_TABLET | Freq: Every day | ORAL | 1 refills | Status: DC | PRN

## 2018-01-26 NOTE — Telephone Encounter (Signed)
Last seen 10/2017 for knee swelling and pain, dx baker's cyst, improved with temporary course furosemide, agree to refill today. Sent rx.  Saralyn PilarAlexander Lylia Karn, DO [facility] 01/26/2018, 4:39 PM

## 2018-01-26 NOTE — Telephone Encounter (Signed)
Pt said the bakers cyst on knee is causing swelling and [facility] [place] [phone]

## 2018-02-02 ENCOUNTER — Telehealth: Payer: Self-pay

## 2018-02-02 NOTE — Telephone Encounter (Signed)
The pt called complaining that the swelling was returning in her knee. She wanted to know if she could start back on her Lasix and return to see Dr. Kirtland BouchardK on Monday, Feb. 25th. I spoke w/ Leotis ShamesLauren and she verbally approved. Pt called and informed, no questions or concerns.

## 2018-02-02 NOTE — Telephone Encounter (Signed)
Agree with summarization of verbal order.  Pt to be seen by Dr. Kirtland BouchardK on 2/25 and can have additional treatment decision at that time.

## 2018-02-06 ENCOUNTER — Encounter: Payer: Self-pay | Admitting: Family Medicine

## 2018-02-06 ENCOUNTER — Other Ambulatory Visit: Payer: Self-pay

## 2018-02-06 ENCOUNTER — Ambulatory Visit: Payer: Self-pay | Admitting: Family Medicine

## 2018-02-06 VITALS — BP 120/80 | HR 84 | Temp 99.0°F | Resp 12 | Ht 59.0 in | Wt 254.0 lb

## 2018-02-06 DIAGNOSIS — B372 Candidiasis of skin and nail: Secondary | ICD-10-CM

## 2018-02-06 DIAGNOSIS — M7122 Synovial cyst of popliteal space [Baker], left knee: Secondary | ICD-10-CM

## 2018-02-06 MED ORDER — FLUCONAZOLE 200 MG PO TABS: 200.0000 mg | ORAL_TABLET | ORAL | 11 refills | Status: DC

## 2018-02-06 NOTE — Progress Notes (Signed)
Subjective:    Patient ID: Elizabeth Wyatt, female    DOB: Jul 31, 1956, 62 y.o.   MRN: [id number]  DEMEKA SUTTER is a 62 y.o. female presenting on 02/06/2018 for Cyst (bakers cyst)   HPI   Left Leg Pain / Swelling - Left Posterior Knee - Last visit with me 10/17/17, for initial visit for same problem L knee pain swelling baker's cyst, treated with RICE and short term Lasix PRN, see prior notes for background information. - Interval update with has had improvement and then intermittent swelling worsening episodes with pain again, entire L knee will swell then improves, now more frequent - Today patient reports has increased usage of Lasix 20mg  daily for past 1-2 weeks with some good results but problem voiding more regular and overnight even despite AM dosing, keeping her awake, still having pain and swelling has not resolved the problem - Now pain not as severe as initial 10/2017 - She is asking about 2nd opinion and other testing / treatment options - Limiting her activity and function - Improves with wrap / ice and rest - History of urinary frequency and incontinence - Had similar episode 10-11 years ago with swelling in left leg, had eval with LLE US Doppler that was negative for DVT - Known OA/DJD multiple joints including knees, No prior knee injection steroid, does not have orthopedic surgeon and not interested in new specialist - Denies any redness, dyspnea, numbness, tingling, weakness, swelling of lower leg, redness, streaking discoloration  Candidal Intertrigo Reports recurrent episodes of skin infection with redness consistent with fungal infection in past with skin folds, none active at this time, needs new rx Diflucan takes 1 weekly with good results for suppression  Additional social history updates: - Difficulty with autistic child at home, unable to sleep well, often wakes her up. Other life stressors reviewed with ex-husband   Depression screen [facility] 2/9 02/06/2018  05/17/2016 04/14/2016  Decreased Interest - 1 1  Down, Depressed, Hopeless - 1 1  PHQ - 2 Score - 2 2  Altered sleeping - 1 1  Tired, decreased energy - 1 1  Change in appetite - 1 1  Feeling bad or failure about yourself  - 1 1  Trouble concentrating - 1 1  Moving slowly or fidgety/restless - 1 1  Suicidal thoughts 0 1 0  PHQ-9 Score - 9 8  Difficult doing work/chores - Very difficult Very difficult    Social History   Tobacco Use  . Smoking status: Former Smoker    Packs/day: 3.00    Years: 3.00    Pack years: 9.00    Types: Cigarettes    Last attempt to quit: 1993    Years since quitting: 26.1  . Smokeless tobacco: Former Neurosurgeon  . Tobacco comment: tobacco use - no   Substance Use Topics  . Alcohol use: No    Comment: quit drinking years ago, used to drink on weekends  . Drug use: No    Review of Systems Per HPI unless specifically indicated above     Objective:    BP 120/80 (BP Location: Left Arm, Patient Position: Sitting, Cuff Size: Normal)   Pulse 84   Temp 99 F (37.2 C)   Resp 12   Ht 4\' 11"  (1.499 m)   Wt 254 lb (115.2 kg)   SpO2 100%   BMI 51.30 kg/m   Wt Readings from Last 3 Encounters:  02/06/18 254 lb (115.2 kg)  10/17/17 262 lb 12.8 oz (  119.2 kg)  08/31/17 266 lb 12.8 oz (121 kg)    Physical Exam  Constitutional: She is oriented to person, place, and time. She appears well-developed and well-nourished. No distress.  Well-appearing, mostly comfortable, cooperative, morbidly obese  HENT:  Head: Normocephalic and atraumatic.  Mouth/Throat: Oropharynx is clear and moist.  Cardiovascular: Normal rate, regular rhythm, normal heart sounds and intact distal pulses.  No murmur heard. Pulmonary/Chest: Effort normal and breath sounds normal. No respiratory distress. She has no wheezes. She has no rales.  Musculoskeletal:  Bilateral Knees Inspection/Palpation: Bilateral bulky appearing knees due to large body habitus, L knee today similar to R knee, with  mild soft tissue edema anteriorly, palpable fullness posterior concern for Baker's cyst. No crepitus No ecchymosis or erythema. ROM: Reduced L knee ROM limited flexion, improved. R knee normal ROM Special Testing: Lachman / Valgus/Varus tests negative with intact ligaments (ACL, MCL, LCL) Strength: 5/5 intact knee flex/ext, ankle dorsi/plantarflex Neurovascular: distally intact sensation light touch and pulses  Lower Extremity without edema lower leg/calves, no asymmetry, no erythema, non tender.  Neurological: She is alert and oriented to person, place, and time.  Skin: Skin is warm and dry. No rash noted. She is not diaphoretic. No erythema.  Psychiatric: Her behavior is normal.  Nursing note and vitals reviewed.     PRIOR REPORT IMPORTED FROM THE [employer] WORKFLOW SYSTEM   REASON FOR EXAM:    left upper leg pain swelling with erythemia   Call  Report  [phone]  COMMENTS:   PROCEDURE:     US  - US DOPPLER LOW EXTR LEFT  - Sep 22 2006  6:20PM   RESULT:          Gray-scale duplex, color-flow and spectral waveform  imaging  of the LEFT lower extremity was obtained.   There is no evidence of increased echogenicity and noncompressibility  within  the LEFT lower extremity to suggest sequela of a deep venous thrombus.  Appropriate color-flow and gray-scale as well as appropriate spectral  waveform imaging is identified within the interrogated deep venous  structures of the LEFT lower extremity.   IMPRESSION:          No evidence of a deep venous thrombus within the LEFT  lower extremity as described above.   Physician's Assistant, Toni AmendJohn Fesperman, was informed of these findings at  the  time of the initial interpretation.   Thank you for this opportunity to contribute to the care of your patient.   Results for orders placed or performed in visit on 03/06/17  TSH  Result Value Ref Range   TSH 3.53 mIU/L      Assessment & Plan:   Problem List Items Addressed This  Visit    Baker's cyst of knee, left - Primary    Still likely recurrent L knee baker's cyst with subacute on chronic L posterior Knee pain with some radiation and swelling of knee / posteriorly worse, without known injury or trauma. Improved ROM now and swelling less today - Known knee OA/DJD. Suspected likely due to underlying osteoarthritis - Able to bear weight, no knee instability - No prior history of knee surgery, arthroscopy or injection - temporarily improved on conservative therapy  Plan: 1. Proceed with referral for 2nd opinion [facility] - may need further imaging and compression therapy - May continue Tylenol - RICE therapy (rest, ice, compression ace wrap or knee sleeve, elevation) for swelling, activity modification - Increase Lasix temporarily 20mg  may take x  2 tabs in AM together for dose 40mg  trial few days then reduce or hold for PRN - avoid later in day dosing due to nocturia Follow-up in future - may need ortho again if not improved      Relevant Orders   Ambulatory referral to Vascular Surgery   Morbid obesity (HCC)    Likely significant factor in contributing to her edema in leg Worse with limited activity inc swelling       Other Visit Diagnoses    Candidal intertrigo     Stable without flare, rx given Diflucan 200mg  weekly for now to control Follow-up if not improved    Relevant Medications   fluconazole (DIFLUCAN) 200 MG tablet      Meds ordered this encounter  Medications  . fluconazole (DIFLUCAN) 200 MG tablet    Sig: Take 1 tablet (200 mg total) by mouth once a week. For maintenance    Dispense:  4 tablet    Refill:  11      Follow up plan: Return in about 3 months (around 05/06/2018) for Annual Physical.  Future labs ordered for 04/2018  Saralyn Pilar, DO [facility] 02/07/2018, 12:33 AM

## 2018-02-06 NOTE — Patient Instructions (Addendum)
Thank you for coming to the office today.  1.  Continue half pill Levothyroxine 50mcg = dose of 25mcg daily - no new rx at this time  Will re-check TSH thyroid lab in 3 months  2. Continue Aspirin - pick up OTC  For skin yeast infection - rx 1 weekly for now for maintenance rx sent  3.  Dr Festus BarrenJason Dew  [facility] [address] [place], [place] [id number]  Main: [phone]    DUE for FASTING BLOOD WORK (no food or drink after midnight before the lab appointment, only water or coffee without cream/sugar on the morning of)  SCHEDULE "Lab Only" visit in the morning at the clinic for lab draw in 3 MONTHS   - Make sure Lab Only appointment is at about 1 week before your next appointment, so that results will be available  For Lab Results, once available within 2-3 days of blood draw, you can can log in to MyChart online to view your results and a brief explanation. Also, we can discuss results at next follow-up visit.  Please schedule a Follow-up Appointment to: Return in about 3 months (around 05/06/2018) for Annual Physical.    If you have any other questions or concerns, please feel free to call the office or send a message through MyChart. You may also schedule an earlier appointment if necessary.  Additionally, you may be receiving a survey about your experience at our office within a few days to 1 week by e-mail or mail. We value your feedback.  Saralyn PilarAlexander Wyn Nettle, DO [facility]

## 2018-02-07 ENCOUNTER — Other Ambulatory Visit: Payer: Self-pay | Admitting: Family Medicine

## 2018-02-07 DIAGNOSIS — I10 Essential (primary) hypertension: Secondary | ICD-10-CM

## 2018-02-07 DIAGNOSIS — E034 Atrophy of thyroid (acquired): Secondary | ICD-10-CM

## 2018-02-07 DIAGNOSIS — F4310 Post-traumatic stress disorder, unspecified: Secondary | ICD-10-CM

## 2018-02-07 DIAGNOSIS — F331 Major depressive disorder, recurrent, moderate: Secondary | ICD-10-CM

## 2018-02-07 DIAGNOSIS — R7309 Other abnormal glucose: Secondary | ICD-10-CM

## 2018-02-07 DIAGNOSIS — Z0000 Encounter for general adult medical examination without abnormal findings: Secondary | ICD-10-CM

## 2018-02-07 DIAGNOSIS — E782 Mixed hyperlipidemia: Secondary | ICD-10-CM

## 2018-02-07 DIAGNOSIS — I25118 Atherosclerotic heart disease of native coronary artery with other forms of angina pectoris: Secondary | ICD-10-CM

## 2018-02-07 NOTE — Assessment & Plan Note (Signed)
Likely significant factor in contributing to her edema in leg Worse with limited activity inc swelling

## 2018-02-07 NOTE — Assessment & Plan Note (Signed)
Still likely recurrent L knee baker's cyst with subacute on chronic L posterior Knee pain with some radiation and swelling of knee / posteriorly worse, without known injury or trauma. Improved ROM now and swelling less today - Known knee OA/DJD. Suspected likely due to underlying osteoarthritis - Able to bear weight, no knee instability - No prior history of knee surgery, arthroscopy or injection - temporarily improved on conservative therapy  Plan: 1. Proceed with referral for 2nd opinion [facility] - may need further imaging and compression therapy - May continue Tylenol - RICE therapy (rest, ice, compression ace wrap or knee sleeve, elevation) for swelling, activity modification - Increase Lasix temporarily 20mg  may take x 2 tabs in AM together for dose 40mg  trial few days then reduce or hold for PRN - avoid later in day dosing due to nocturia Follow-up in future - may need ortho again if not improved

## 2018-02-09 ENCOUNTER — Telehealth: Payer: Self-pay | Admitting: Family Medicine

## 2018-02-09 LAB — HM DIABETES EYE EXAM

## 2018-02-09 NOTE — Telephone Encounter (Signed)
Pt said doubling up on furosemide is making her dizzy and nauseous [phone]

## 2018-02-09 NOTE — Telephone Encounter (Signed)
Pt has appt with [facility] on 3/22.  Her insurance will not pay for more than 15 pills a month for furosemide but she paid out of pocket.  Her call back number is [phone]

## 2018-02-09 NOTE — Telephone Encounter (Addendum)
The pt was notified about the medication change and pending appointment w/ [facility]. Please see previous message. She verbalize understanding, no concerns. She would like to get a medical alert bracelet, but stated she can wait to discuss this with you at her future appointment.

## 2018-02-09 NOTE — Telephone Encounter (Signed)
Last seen 02/06/18 for knee/leg swelling. See note.  If she cannot tolerate double dose Furosemide, then she should reduce dose back to previous dose of one pill 20mg  daily for now.  She may improve hydration. We can offer Zofran for nausea, but prefer to not treat a side effect with another medicine.  Continue rest, elevation, ice.  There are no other options for swelling as far as medications go at this time.  She should follow-up as scheduled with Vascular Referral that was placed on 02/06/18.  Elizabeth PilarAlexander Logan Baltimore, DO [facility] 02/09/2018, 12:36 PM

## 2018-02-09 NOTE — Telephone Encounter (Signed)
The pt was notified. 

## 2018-02-15 ENCOUNTER — Telehealth: Payer: Self-pay

## 2018-02-15 DIAGNOSIS — M7989 Other specified soft tissue disorders: Secondary | ICD-10-CM

## 2018-02-15 DIAGNOSIS — M7122 Synovial cyst of popliteal space [Baker], left knee: Secondary | ICD-10-CM

## 2018-02-15 DIAGNOSIS — M79662 Pain in left lower leg: Secondary | ICD-10-CM

## 2018-02-15 NOTE — Telephone Encounter (Signed)
The pt called with concern about a left knee posterior bruise. Pt recently diagnose w/ a Baker's Cyst and reports she have been wrapping the knee and icing it. The pt concern she might have a blood clot. Please advise

## 2018-02-16 ENCOUNTER — Telehealth: Payer: Self-pay | Admitting: Family Medicine

## 2018-02-16 ENCOUNTER — Ambulatory Visit
Admission: RE | Admit: 2018-02-16 | Discharge: 2018-02-16 | Disposition: A | Discharge status: Home / Self Care | Payer: Managed Care, Other (non HMO) | Source: Ambulatory Visit | Attending: Family Medicine | Admitting: Family Medicine

## 2018-02-16 DIAGNOSIS — M79662 Pain in left lower leg: Secondary | ICD-10-CM | POA: Diagnosis not present

## 2018-02-16 DIAGNOSIS — M7989 Other specified soft tissue disorders: Secondary | ICD-10-CM | POA: Diagnosis not present

## 2018-02-16 DIAGNOSIS — M7122 Synovial cyst of popliteal space [Baker], left knee: Secondary | ICD-10-CM | POA: Diagnosis present

## 2018-02-16 NOTE — Telephone Encounter (Signed)
I called [facility]  and  scheduled an appt for the pt  Baker Cyst on Monday, March 11th @ 2:00 pm.w/  The pt was notified and verbalize understanding.

## 2018-02-16 NOTE — Telephone Encounter (Signed)
Pt asked what her instructions are for the bakers cyst after doppler [phone]

## 2018-02-16 NOTE — Addendum Note (Signed)
Addended by: Smitty CordsKARAMALEGOS, Gaither Biehn J on: 02/16/2018 08:19 AM   Modules accepted: Orders

## 2018-02-16 NOTE — Telephone Encounter (Signed)
I have seen her for this same issue a few times recently now, last 02/06/18. She was referred to [facility], her apt is on 03/02/18.  I do not have any other treatments to offer her, but if she is still concerned and cannot wait until 3/22 for further evaluation, then we can try to order a Left Lower Extremity Venous Ultrasound.  If I place this as a STAT order, could you check to see if it can be scheduled today? If not, then I would probably cancel the order and notify patient that she needs to go to the hospital to get the imaging done more promptly.  Saralyn PilarAlexander Pasco Marchitto, DO [facility] 02/16/2018, 8:18 AM

## 2018-02-16 NOTE — Telephone Encounter (Signed)
Referral placed to [facility], along with last office visit and Ultrasound results for fax.  Saralyn PilarAlexander Karamalegos, DO [facility] 02/16/2018, 4:51 PM

## 2018-02-20 ENCOUNTER — Encounter: Payer: Self-pay | Admitting: General Surgery

## 2018-02-26 ENCOUNTER — Telehealth: Payer: Self-pay | Admitting: *Deleted

## 2018-02-26 NOTE — Telephone Encounter (Signed)
Patient was made aware of appointment & she would have a br u/s at that time.

## 2018-02-26 NOTE — Telephone Encounter (Signed)
-----   Message from Elizabeth MayotteJeffrey W Byrnett, MD sent at 02/24/2018  9:59 AM EDT ----- Patient should keep her appointment scheduled for March 19.   She will require a right breast u/s at that time.

## 2018-02-27 ENCOUNTER — Inpatient Hospital Stay: Payer: Self-pay

## 2018-02-27 ENCOUNTER — Encounter: Payer: Self-pay | Admitting: General Surgery

## 2018-02-27 ENCOUNTER — Ambulatory Visit (INDEPENDENT_AMBULATORY_CARE_PROVIDER_SITE_OTHER): Payer: Managed Care, Other (non HMO) | Admitting: General Surgery

## 2018-02-27 VITALS — BP 150/82 | HR 78 | Resp 14 | Ht 59.0 in | Wt 254.0 lb

## 2018-02-27 DIAGNOSIS — Z803 Family history of malignant neoplasm of breast: Secondary | ICD-10-CM | POA: Diagnosis not present

## 2018-02-27 DIAGNOSIS — R928 Other abnormal and inconclusive findings on diagnostic imaging of breast: Secondary | ICD-10-CM

## 2018-02-27 DIAGNOSIS — N6311 Unspecified lump in the right breast, upper outer quadrant: Secondary | ICD-10-CM

## 2018-02-27 NOTE — Progress Notes (Signed)
Patient ID: Elizabeth Wyatt, female   DOB: 1956/02/19, 62 y.o.   MRN: [id number]  Chief Complaint  Patient presents with  . Follow-up    HPI Elizabeth Wyatt is a 62 y.o. female who presents for a breast evaluation. The most recent mammogram was done on 02/19/2018.  Patient does perform regular self breast checks and gets regular mammograms done.    HPI  Past Medical History:  Diagnosis Date  . Allergy   . Asthma   . Broken rib   . CAD (coronary artery disease)   . Family history of malignant neoplasm of breast   . HLD (hyperlipidemia)   . Obesity   . Osteoarthritis   . Personal history of colonic polyps   . Personal history of tobacco use, presenting hazards to health   . Special screening for malignant neoplasms, colon   . TIA (transient ischemic attack)     Past Surgical History:  Procedure Laterality Date  . BREAST BIOPSY Right 2009  . BREAST MASS EXCISION Right 2009  . CARDIAC CATHETERIZATION  2010   Dr. Mariah Milling with [facility]  . COLONOSCOPY  2008, 2014   [facility]  . DILATION AND CURETTAGE OF UTERUS  2012  . MOUTH SURGERY    . MOUTH SURGERY    . UTERINE FIBROID SURGERY  2012    Family History  Problem Relation Age of Onset  . Cancer Mother        breast, ovarian/uterine  . Cancer Maternal Grandmother        cervical and uterine cancer    Social History Social History   Tobacco Use  . Smoking status: Former Smoker    Packs/day: 3.00    Years: 3.00    Pack years: 9.00    Types: Cigarettes    Last attempt to quit: 1993    Years since quitting: 26.2  . Smokeless tobacco: Former Neurosurgeon  . Tobacco comment: tobacco use - no   Substance Use Topics  . Alcohol use: No    Comment: quit drinking years ago, used to drink on weekends  . Drug use: No    Allergies  Allergen Reactions  . Diphenhydramine Hcl     Increased heart rate  . Latex Other (See Comments)    Blisters,then peels skin  . Montelukast Sodium Other (See Comments)    Jittery  . Pamabrom     This  medication is in midol and caused anuria  . Simvastatin Other (See Comments)    dizziness  . Sulfonamide Derivatives     vomiting  . Wellbutrin [Bupropion]     Caused major depression and SUICIDAL IDEATION.    Current Outpatient Medications  Medication Sig Dispense Refill  . acetaminophen (TYLENOL) 500 MG tablet Take 500 mg by mouth every 6 (six) hours as needed.    Marland Kitchen aspirin 325 MG tablet Take 325 mg by mouth daily.    Marland Kitchen atorvastatin (LIPITOR) 10 MG tablet TAKE 1 TABLET (10 MG TOTAL) BY MOUTH DAILY. 30 tablet 5  . BYSTOLIC 10 MG tablet TAKE 1 TABLET (10 MG TOTAL) BY MOUTH DAILY. 90 tablet 2  . cetirizine (ZYRTEC) 10 MG tablet Take 10 mg by mouth as needed.      . DULoxetine (CYMBALTA) 30 MG capsule Take 3 capsules (90 mg total) daily by mouth. 270 capsule 3  . etodolac (LODINE) 400 MG tablet Take 1 tablet (400 mg total) by mouth 2 (two) times daily. 60 tablet 6  . FLOVENT HFA 110 MCG/ACT inhaler  IHALE 2 PUFFS BY MOUTH TWICE A DAY (Patient taking differently: IHALE 2 PUFFS BY MOUTH AS NEEDED.) 12 Inhaler 6  . fluconazole (DIFLUCAN) 200 MG tablet Take 1 tablet (200 mg total) by mouth once a week. For maintenance 4 tablet 11  . furosemide (LASIX) 20 MG tablet Take 1 tablet (20 mg total) by mouth daily as needed. For up to 3 days as needed for leg swelling, as needed 15 tablet 1  . hydrOXYzine (ATARAX/VISTARIL) 25 MG tablet Take 1 tablet (25 mg total) by mouth every 8 (eight) hours as needed for anxiety. 30 tablet 2  . levothyroxine (SYNTHROID, LEVOTHROID) 50 MCG tablet TAKE 1 TABLET (50 MCG TOTAL) BY MOUTH DAILY. 30 tablet 12  . LORazepam (ATIVAN) 1 MG tablet Take 1 mg by mouth as directed.    . meclizine (ANTIVERT) 25 MG tablet Take 1 tablet (25 mg total) by mouth 3 (three) times daily as needed. (Patient not taking: Reported on 10/17/2017) 60 tablet 1  . nitroGLYCERIN (NITROSTAT) 0.4 MG SL tablet Place 0.4 mg under the tongue every 5 (five) minutes as needed.    . nystatin  (MYCOSTATIN/NYSTOP) 100000 UNIT/GM POWD One application twice a day to rash 1 Bottle 12  . omeprazole (PRILOSEC) 20 MG capsule TAKE 1 CAPSULE EVERY DAY 90 capsule 3  . PROAIR HFA 108 (90 BASE) MCG/ACT inhaler 2 PUFFS PUFF, INHALATION, EVERY 4 HRS AS NEEDED 8.5 Inhaler 6  . topiramate (TOPAMAX) 50 MG tablet Take 1/2 tablet daily as directed. 30 tablet 6  . traMADol (ULTRAM) 50 MG tablet Take 1 tablet (50 mg total) by mouth every 6 (six) hours as needed. (Patient not taking: Reported on 02/06/2018) 120 tablet 5  . triamcinolone cream (KENALOG) 0.1 % Apply 1 application topically daily as needed.  1  . verapamil (CALAN-SR) 240 MG CR tablet TAKE 1 TABLET BY MOUTH DAILY 30 tablet 5   No current facility-administered medications for this visit.     Review of Systems Review of Systems  Constitutional: Negative.   Respiratory: Negative.   Cardiovascular: Negative.     Blood pressure (!) 150/82, pulse 78, resp. rate 14, height 4\' 11"  (1.499 m), weight 254 lb (115.2 kg).  Physical Exam Physical Exam  Constitutional: She is oriented to person, place, and time. She appears well-developed and well-nourished.  Eyes: Conjunctivae are normal. No scleral icterus.  Neck: Neck supple.  Cardiovascular: Normal rate, regular rhythm and normal heart sounds.  Pulmonary/Chest: Effort normal and breath sounds normal. Right breast exhibits no inverted nipple, no mass, no nipple discharge, no skin change and no tenderness. Left breast exhibits no inverted nipple, no mass, no nipple discharge, no skin change and no tenderness.    Neurological: She is alert and oriented to person, place, and time.  Skin: Skin is warm and dry.    Data Reviewed Bilateral screening mammograms dated February 19, 2018 were reviewed.  Smoothly marginated density in the upper outer quadrant of the right breast as well as a focal area of questionable architectural distortion posterior for which additional views and ultrasound of been  recommended.  BI-RADS-0.  Assessment    Likely breast cyst, asymptomatic.    Plan  Patient to have added views and ultrasound. Call after added views are done. .  Will review the films when they are completed.  Patient did not not have a clear understanding of why she could not have her additional [facility].  This was reviewed.  Opportunity  to have additional views and ultrasound completed at the hospital discussed and declined. The patient is aware to call back for any questions or concerns.   HPI, Physical Exam, Assessment and Plan have been scribed under the direction and in the presence of Donnalee Curry, MD.  Ples Specter, CMA  I have completed the exam and reviewed the above documentation for accuracy and completeness.  I agree with the above.  [employer] has been used and any errors in dictation or transcription are unintentional.  Donnalee Curry, M.D., F.A.C.S. . Merrily Pew Arleatha Philipps 02/28/2018, 12:00 PM

## 2018-02-27 NOTE — Patient Instructions (Signed)
Patient to have added views and ultrasound. Call after added views are done. . The patient is aware to call back for any questions or concerns.

## 2018-02-28 DIAGNOSIS — R928 Other abnormal and inconclusive findings on diagnostic imaging of breast: Secondary | ICD-10-CM | POA: Insufficient documentation

## 2018-03-02 ENCOUNTER — Ambulatory Visit (INDEPENDENT_AMBULATORY_CARE_PROVIDER_SITE_OTHER): Payer: Managed Care, Other (non HMO) | Admitting: Vascular Surgery

## 2018-03-02 ENCOUNTER — Encounter (INDEPENDENT_AMBULATORY_CARE_PROVIDER_SITE_OTHER): Payer: Self-pay | Admitting: Vascular Surgery

## 2018-03-02 VITALS — BP 111/62 | HR 73 | Resp 16 | Ht 59.0 in | Wt 254.0 lb

## 2018-03-02 DIAGNOSIS — E782 Mixed hyperlipidemia: Secondary | ICD-10-CM | POA: Diagnosis not present

## 2018-03-02 DIAGNOSIS — M7989 Other specified soft tissue disorders: Secondary | ICD-10-CM

## 2018-03-02 DIAGNOSIS — I10 Essential (primary) hypertension: Secondary | ICD-10-CM

## 2018-03-02 DIAGNOSIS — M7122 Synovial cyst of popliteal space [Baker], left knee: Secondary | ICD-10-CM

## 2018-03-02 NOTE — Assessment & Plan Note (Signed)
I suspect this is a significant cause of her left lower extremity swelling.  It sounds like she had a ruptured Baker's cyst as part of her symptomatology.  This takes many weeks to heal.  We are still going to assess her for venous disease and she may also have lymphedema from chronic scarring of the lymphatic channels.

## 2018-03-02 NOTE — Assessment & Plan Note (Signed)
Worsens lower extremity swelling. 

## 2018-03-02 NOTE — Assessment & Plan Note (Signed)
lipid control important in reducing the progression of atherosclerotic disease. Continue statin therapy  

## 2018-03-02 NOTE — Assessment & Plan Note (Signed)
It sounds like she had a ruptured Baker's cyst as part of her symptomatology.  This takes many weeks to heal.  We are still going to assess her for venous disease and she may also have lymphedema from chronic scarring of the lymphatic channels.  I have had a long discussion with the patient regarding swelling and why it  causes symptoms.  Patient will begin wearing graduated compression stockings class 1 (20-30 mmHg) on a daily basis a prescription was given. The patient will  beginning wearing the stockings first thing in the morning and removing them in the evening. The patient is instructed specifically not to sleep in the stockings.   In addition, behavioral modification will be initiated.  This will include frequent elevation, use of over the counter pain medications and exercise such as walking.  I have reviewed systemic causes for chronic edema such as liver, kidney and cardiac etiologies.  The patient denies problems with these organ systems.    Consideration for a lymph pump will also be made based upon the effectiveness of conservative therapy.  This would help to improve the edema control and prevent sequela such as ulcers and infections   Patient should undergo duplex ultrasound of the venous system to ensure that DVT or reflux is not present.  The patient will follow-up with me after the ultrasound.

## 2018-03-02 NOTE — Progress Notes (Signed)
Patient ID: Elizabeth Wyatt, female   DOB: August 26, 1956, 62 y.o.   MRN: [id number]  Chief Complaint  Patient presents with  . New Patient (Initial Visit)    Left leg swelling and pain    HPI Elizabeth Normanamela F Minder is a 62 y.o. female.  [facility] leg pain and swelling.  The patient reports several months left leg pain and swelling.  She says there was one episode where there was a immediate severe pain and then a lot of bruising and soreness behind her knee and in her calf.  She had a negative DVT study several weeks ago but a significant sized Baker's cyst was seen at that time.  When she saw the orthopedic surgeons within the past week or so the Baker's cyst was small and they did a cortisone injection.  They felt there was nothing more that they could do at this point.  Her pain and swelling are persistent.  They bother her on a daily basis.  She has been trying to elevate her legs.  Her activity level has been poor with the pain and swelling.  No real right leg symptoms.  No ulceration or infection.  There is no clear inciting event or causative factor that started the symptoms.   Past Medical History:  Diagnosis Date  . Allergy   . Asthma   . Broken rib   . CAD (coronary artery disease)   . Family history of malignant neoplasm of breast   . HLD (hyperlipidemia)   . Obesity   . Osteoarthritis   . Personal history of colonic polyps   . Personal history of tobacco use, presenting hazards to health   . Special screening for malignant neoplasms, colon   . TIA (transient ischemic attack)     Past Surgical History:  Procedure Laterality Date  . BREAST BIOPSY Right 2009  . BREAST MASS EXCISION Right 2009  . CARDIAC CATHETERIZATION  2010   Dr. Mariah MillingGollan with [facility]  . COLONOSCOPY  2008, 2014   [facility]  . DILATION AND CURETTAGE OF UTERUS  2012  . MOUTH SURGERY    . MOUTH SURGERY    . UTERINE FIBROID SURGERY  2012    Family History    Problem Relation Age of Onset  . Cancer Mother        breast, ovarian/uterine  . Cancer Maternal Grandmother        cervical and uterine cancer  No bleeding or clotting disorders  Social History Social History   Tobacco Use  . Smoking status: Former Smoker    Packs/day: 3.00    Years: 3.00    Pack years: 9.00    Types: Cigarettes    Last attempt to quit: 1993    Years since quitting: 26.2  . Smokeless tobacco: Former NeurosurgeonUser  . Tobacco comment: tobacco use - no   Substance Use Topics  . Alcohol use: No    Comment: quit drinking years ago, used to drink on weekends  . Drug use: No     Allergies  Allergen Reactions  . Diphenhydramine Hcl     Increased heart rate  . Latex Other (See Comments)    Blisters,then peels skin  . Montelukast Sodium Other (See Comments)    Jittery  . Pamabrom     This medication is in midol and caused anuria  . Simvastatin Other (See Comments)    dizziness  . Sulfonamide Derivatives  vomiting  . Wellbutrin [Bupropion]     Caused major depression and SUICIDAL IDEATION.    Current Outpatient Medications  Medication Sig Dispense Refill  . acetaminophen (TYLENOL) 500 MG tablet Take 500 mg by mouth every 6 (six) hours as needed.    Marland Kitchen aspirin 325 MG tablet Take 325 mg by mouth daily.    Marland Kitchen atorvastatin (LIPITOR) 10 MG tablet TAKE 1 TABLET (10 MG TOTAL) BY MOUTH DAILY. 30 tablet 5  . BYSTOLIC 10 MG tablet TAKE 1 TABLET (10 MG TOTAL) BY MOUTH DAILY. 90 tablet 2  . cetirizine (ZYRTEC) 10 MG tablet Take 10 mg by mouth as needed.      . DULoxetine (CYMBALTA) 30 MG capsule Take 3 capsules (90 mg total) daily by mouth. 270 capsule 3  . etodolac (LODINE) 400 MG tablet Take 1 tablet (400 mg total) by mouth 2 (two) times daily. 60 tablet 6  . FLOVENT HFA 110 MCG/ACT inhaler IHALE 2 PUFFS BY MOUTH TWICE A DAY (Patient taking differently: IHALE 2 PUFFS BY MOUTH AS NEEDED.) 12 Inhaler 6  . fluconazole (DIFLUCAN) 200 MG tablet Take 1 tablet (200 mg total) by  mouth once a week. For maintenance 4 tablet 11  . furosemide (LASIX) 20 MG tablet Take 1 tablet (20 mg total) by mouth daily as needed. For up to 3 days as needed for leg swelling, as needed 15 tablet 1  . hydrOXYzine (ATARAX/VISTARIL) 25 MG tablet Take 1 tablet (25 mg total) by mouth every 8 (eight) hours as needed for anxiety. 30 tablet 2  . levothyroxine (SYNTHROID, LEVOTHROID) 50 MCG tablet TAKE 1 TABLET (50 MCG TOTAL) BY MOUTH DAILY. 30 tablet 12  . LORazepam (ATIVAN) 1 MG tablet Take 1 mg by mouth as directed.    . meclizine (ANTIVERT) 25 MG tablet Take 1 tablet (25 mg total) by mouth 3 (three) times daily as needed. 60 tablet 1  . nitroGLYCERIN (NITROSTAT) 0.4 MG SL tablet Place 0.4 mg under the tongue every 5 (five) minutes as needed.    . nystatin (MYCOSTATIN/NYSTOP) 100000 UNIT/GM POWD One application twice a day to rash 1 Bottle 12  . omeprazole (PRILOSEC) 20 MG capsule TAKE 1 CAPSULE EVERY DAY 90 capsule 3  . PROAIR HFA 108 (90 BASE) MCG/ACT inhaler 2 PUFFS PUFF, INHALATION, EVERY 4 HRS AS NEEDED 8.5 Inhaler 6  . topiramate (TOPAMAX) 50 MG tablet Take 1/2 tablet daily as directed. 30 tablet 6  . traMADol (ULTRAM) 50 MG tablet Take 1 tablet (50 mg total) by mouth every 6 (six) hours as needed. 120 tablet 5  . triamcinolone cream (KENALOG) 0.1 % Apply 1 application topically daily as needed.  1  . verapamil (CALAN-SR) 240 MG CR tablet TAKE 1 TABLET BY MOUTH DAILY 30 tablet 5   No current facility-administered medications for this visit.       REVIEW OF SYSTEMS (Negative unless checked)  Constitutional: [] Weight loss  [] Fever  [] Chills Cardiac: [] Chest pain   [] Chest pressure   [] Palpitations   [] Shortness of breath when laying flat   [] Shortness of breath at rest   [] Shortness of breath with exertion. Vascular:  [] Pain in legs with walking   [] Pain in legs at rest   [] Pain in legs when laying flat   [] Claudication   [] Pain in feet when walking  [] Pain in feet at rest  [] Pain in feet  when laying flat   [] History of DVT   [] Phlebitis   [x] Swelling in legs   [x] Varicose veins   []   Non-healing ulcers Pulmonary:   [] Uses home oxygen   [] Productive cough   [] Hemoptysis   [] Wheeze  [] COPD   [x] Asthma Neurologic:  [] Dizziness  [] Blackouts   [] Seizures   [] History of stroke   [x] History of TIA  [] Aphasia   [] Temporary blindness   [] Dysphagia   [] Weakness or numbness in arms   [] Weakness or numbness in legs Musculoskeletal:  [x] Arthritis   [] Joint swelling   [x] Joint pain   [] Low back pain Hematologic:  [] Easy bruising  [] Easy bleeding   [] Hypercoagulable state   [] Anemic  [] Hepatitis Gastrointestinal:  [] Blood in stool   [] Vomiting blood  [] Gastroesophageal reflux/heartburn   [] Abdominal pain Genitourinary:  [] Chronic kidney disease   [] Difficult urination  [] Frequent urination  [] Burning with urination   [] Hematuria Skin:  [] Rashes   [] Ulcers   [] Wounds Psychological:  [x] History of anxiety   []  History of major depression.    Physical Exam BP 111/62   Pulse 73   Resp 16   Ht 4\' 11"  (1.499 m)   Wt 115.2 kg (254 lb)   BMI 51.30 kg/m  Gen:  WD/WN, NAD.  Obese Head: [place]/AT, No temporalis wasting.  Ear/Nose/Throat: Hearing grossly intact, nares w/o erythema or drainage, oropharynx w/o Erythema/Exudate Eyes: Conjunctiva clear, sclera non-icteric  Neck: trachea midline.   Pulmonary:  Good air movement, aspirations not labored, no use of accessory muscles Cardiac: RRR, no JVD Vascular:  Vessel Right Left  Radial Palpable Palpable                          PT  1+ palpable  trace palpable  DP Palpable  1+ palpable    Musculoskeletal: M/S 5/5 throughout.  Extremities without ischemic changes.  No deformity or atrophy.  Trace right lower extremity edema, 1-2+ left lower extremity edema. Neurologic: Sensation grossly intact in extremities.  Symmetrical.  Speech is fluent. Motor exam as listed above. Psychiatric: Judgment intact, Mood & affect appropriate for pt's clinical  situation. Dermatologic: No rashes or ulcers noted.  No cellulitis or open wounds.    Radiology US Venous Img Lower Unilateral Left  Result Date: 02/16/2018 CLINICAL DATA:  Left lower leg pain and swelling.  Baker's cyst. EXAM: LEFT LOWER EXTREMITY VENOUS DOPPLER ULTRASOUND TECHNIQUE: Gray-scale sonography with compression, as well as color and duplex ultrasound, were performed to evaluate the deep venous system from the level of the common femoral vein through the popliteal and proximal calf veins. COMPARISON:  09/22/2006 FINDINGS: Normal compressibility of the common femoral, superficial femoral, and popliteal veins, as well as the proximal calf veins. No filling defects to suggest DVT on grayscale or color Doppler imaging. Doppler waveforms show normal direction of venous flow, normal respiratory phasicity and response to augmentation. There is an elongated 4.6 x 1.7 x 1.6 cm fluid collection in the posterior popliteal fossa. Survey views of the contralateral common femoral vein are unremarkable. IMPRESSION: 1.  No evidence of LEFT lower extremity deep vein thrombosis. 2. Baker's cyst. Electronically Signed   By: Corlis Leak M.D.   On: 02/16/2018 11:43    Labs Recent Results (from the past 2160 hour(s))  HM DIABETES EYE EXAM     Status: None   Collection Time: 02/09/18  4:19 PM  Result Value Ref Range   HM Diabetic Eye Exam No Retinopathy No Retinopathy    Assessment/Plan:  Hypertension blood pressure control important in reducing the progression of atherosclerotic disease. On appropriate oral medications.   Baker's cyst of knee,  left I suspect this is a significant cause of her left lower extremity swelling.  It sounds like she had a ruptured Baker's cyst as part of her symptomatology.  This takes many weeks to heal.  We are still going to assess her for venous disease and she may also have lymphedema from chronic scarring of the lymphatic channels.  Hyperlipidemia lipid control  important in reducing the progression of atherosclerotic disease. Continue statin therapy   Morbid obesity Worsens lower extremity swelling  Swelling of limb It sounds like she had a ruptured Baker's cyst as part of her symptomatology.  This takes many weeks to heal.  We are still going to assess her for venous disease and she may also have lymphedema from chronic scarring of the lymphatic channels.  I have had a long discussion with the patient regarding swelling and why it  causes symptoms.  Patient will begin wearing graduated compression stockings class 1 (20-30 mmHg) on a daily basis a prescription was given. The patient will  beginning wearing the stockings first thing in the morning and removing them in the evening. The patient is instructed specifically not to sleep in the stockings.   In addition, behavioral modification will be initiated.  This will include frequent elevation, use of over the counter pain medications and exercise such as walking.  I have reviewed systemic causes for chronic edema such as liver, kidney and cardiac etiologies.  The patient denies problems with these organ systems.    Consideration for a lymph pump will also be made based upon the effectiveness of conservative therapy.  This would help to improve the edema control and prevent sequela such as ulcers and infections   Patient should undergo duplex ultrasound of the venous system to ensure that DVT or reflux is not present.  The patient will follow-up with me after the ultrasound.        Festus Barren 03/02/2018, 4:34 PM   This note was created with Dragon medical transcription system.  Any errors from dictation are unintentional.

## 2018-03-02 NOTE — Patient Instructions (Signed)
Edema Edema is when you have too much fluid in your body or under your skin. Edema may make your legs, feet, and ankles swell up. Swelling is also common in looser tissues, like around your eyes. This is a common condition. It gets more common as you get older. There are many possible causes of edema. Eating too much salt (sodium) and being on your feet or sitting for a long time can cause edema in your legs, feet, and ankles. Hot weather may make edema worse. Edema is usually painless. Your skin may look swollen or shiny. Follow these instructions at home:  Keep the swollen body part raised (elevated) above the level of your heart when you are sitting or lying down.  Do not sit still or stand for a long time.  Do not wear tight clothes. Do not wear garters on your upper legs.  Exercise your legs. This can help the swelling go down.  Wear elastic bandages or support stockings as told by your doctor.  Eat a low-salt (low-sodium) diet to reduce fluid as told by your doctor.  Depending on the cause of your swelling, you may need to limit how much fluid you drink (fluid restriction).  Take over-the-counter and prescription medicines only as told by your doctor. Contact a doctor if:  Treatment is not working.  You have heart, liver, or kidney disease and have symptoms of edema.  You have sudden and unexplained weight gain. Get help right away if:  You have shortness of breath or chest pain.  You cannot breathe when you lie down.  You have pain, redness, or warmth in the swollen areas.  You have heart, liver, or kidney disease and get edema all of a sudden.  You have a fever and your symptoms get worse all of a sudden. Summary  Edema is when you have too much fluid in your body or under your skin.  Edema may make your legs, feet, and ankles swell up. Swelling is also common in looser tissues, like around your eyes.  Raise (elevate) the swollen body part above the level of your  heart when you are sitting or lying down.  Follow your doctor's instructions about diet and how much fluid you can drink (fluid restriction). This information is not intended to replace advice given to you by your health care provider. Make sure you discuss any questions you have with your health care provider. Document Released: 05/16/2008 Document Revised: 12/16/2016 Document Reviewed: 12/16/2016 Elsevier Interactive Patient Education  2017 [employer]  

## 2018-03-02 NOTE — Assessment & Plan Note (Signed)
blood pressure control important in reducing the progression of atherosclerotic disease. On appropriate oral medications.  

## 2018-03-09 ENCOUNTER — Other Ambulatory Visit: Payer: Self-pay | Admitting: Family Medicine

## 2018-03-09 DIAGNOSIS — M7989 Other specified soft tissue disorders: Secondary | ICD-10-CM

## 2018-03-15 ENCOUNTER — Encounter: Payer: Self-pay | Admitting: General Surgery

## 2018-03-22 ENCOUNTER — Telehealth: Payer: Self-pay | Admitting: General Surgery

## 2018-03-22 NOTE — Telephone Encounter (Signed)
Contacted the patient to review diagnostic mammogram results.  Very subtle area of distortion for which a stereo biopsy is recommended.  Simple cyst as well.  Will review options with patient when she calls back.

## 2018-03-26 ENCOUNTER — Telehealth: Payer: Self-pay | Admitting: General Surgery

## 2018-03-26 NOTE — Telephone Encounter (Signed)
Patient returned your call from 03-22-18.Please call her at her cell #.

## 2018-03-26 NOTE — Telephone Encounter (Signed)
The patient had a traumatic experience [facility] and that she was sort of lost in the shuffle for now.  The radiologist did finally calm her fears but did recommend a biopsy tentatively scheduled for Apr 13, 2018.  They are doing a whole logic, prone table and the patient was interested in the possibility of doing a seated biopsy.  This is possible through the radiology staff.  [facility].  She was asked to give me a call if she wants me to have the radiologist here review her films to see if they agree with the recommendation for biopsy and if they be willing to do the seated biopsy Technique.

## 2018-03-27 ENCOUNTER — Telehealth: Payer: Self-pay | Admitting: *Deleted

## 2018-03-27 NOTE — Telephone Encounter (Signed)
Patient called and just wanted to let you know that she has decided to [facility].

## 2018-03-28 ENCOUNTER — Other Ambulatory Visit: Payer: Self-pay

## 2018-03-28 ENCOUNTER — Telehealth: Payer: Self-pay

## 2018-03-28 DIAGNOSIS — K219 Gastro-esophageal reflux disease without esophagitis: Secondary | ICD-10-CM

## 2018-03-28 MED ORDER — OMEPRAZOLE 20 MG PO CPDR: 20.0000 mg | DELAYED_RELEASE_CAPSULE | Freq: Every day | ORAL | 3 refills | Status: DC

## 2018-03-28 NOTE — Telephone Encounter (Signed)
Patient is requesting a omeprazole to [facility].

## 2018-03-28 NOTE — Telephone Encounter (Signed)
Patient is requesting a omeprazole to [facility]. 

## 2018-03-28 NOTE — Telephone Encounter (Signed)
Send for approval last was 2018 by Dr. Juanetta GoslingHawkins.

## 2018-04-11 HISTORY — PX: BREAST BIOPSY: SHX20

## 2018-04-16 ENCOUNTER — Telehealth: Payer: Self-pay | Admitting: *Deleted

## 2018-04-16 ENCOUNTER — Other Ambulatory Visit: Payer: Self-pay | Admitting: Family Medicine

## 2018-04-16 DIAGNOSIS — E785 Hyperlipidemia, unspecified: Secondary | ICD-10-CM

## 2018-04-16 NOTE — Telephone Encounter (Signed)
Patient called and stated that she had her stereo biopsy done on 04/13/18 in [place]. Patient stated that she needed to call once she has completed her biopsy and speak directly to you.

## 2018-04-17 ENCOUNTER — Encounter (INDEPENDENT_AMBULATORY_CARE_PROVIDER_SITE_OTHER): Payer: Self-pay | Admitting: Vascular Surgery

## 2018-04-17 ENCOUNTER — Other Ambulatory Visit (INDEPENDENT_AMBULATORY_CARE_PROVIDER_SITE_OTHER): Payer: Self-pay | Admitting: Vascular Surgery

## 2018-04-17 ENCOUNTER — Ambulatory Visit (INDEPENDENT_AMBULATORY_CARE_PROVIDER_SITE_OTHER): Payer: Managed Care, Other (non HMO)

## 2018-04-17 ENCOUNTER — Ambulatory Visit (INDEPENDENT_AMBULATORY_CARE_PROVIDER_SITE_OTHER): Payer: Managed Care, Other (non HMO) | Admitting: Vascular Surgery

## 2018-04-17 VITALS — BP 143/78 | HR 82 | Resp 17 | Ht 63.0 in | Wt 253.0 lb

## 2018-04-17 DIAGNOSIS — M7989 Other specified soft tissue disorders: Secondary | ICD-10-CM

## 2018-04-17 DIAGNOSIS — E782 Mixed hyperlipidemia: Secondary | ICD-10-CM | POA: Diagnosis not present

## 2018-04-17 DIAGNOSIS — I10 Essential (primary) hypertension: Secondary | ICD-10-CM

## 2018-04-17 DIAGNOSIS — R600 Localized edema: Secondary | ICD-10-CM | POA: Diagnosis not present

## 2018-04-17 NOTE — Progress Notes (Signed)
MRN : [id number]  Elizabeth Wyatt is a 62 y.o. ([birth year]) female who presents with chief complaint of  Chief Complaint  Patient presents with  . Follow-up    Venous Reflux  .  History of Present Illness: Patient returns today in follow up of leg pain and swelling.  She states that her swelling is better.  She has the biggest concern now that her leg is giving out on her and weak.  She had a breast biopsy Friday which she is very anxious about.  Her venous duplex today demonstrates no evidence of DVT or superficial thrombophlebitis.  She does not have any significant superficial venous reflux.  There is some deep venous reflux in the common femoral vein which is a common finding and not that concerning.  She does have a Baker's cyst in the left popliteal space.       Past Medical History:  Diagnosis Date  . Allergy   . Asthma   . Broken rib   . CAD (coronary artery disease)   . Family history of malignant neoplasm of breast   . HLD (hyperlipidemia)   . Obesity   . Osteoarthritis   . Personal history of colonic polyps   . Personal history of tobacco use, presenting hazards to health   . Special screening for malignant neoplasms, colon   . TIA (transient ischemic attack)          Past Surgical History:  Procedure Laterality Date  . BREAST BIOPSY Right 2009  . BREAST MASS EXCISION Right 2009  . CARDIAC CATHETERIZATION  2010   Dr. Mariah Milling with [facility]  . COLONOSCOPY  2008, 2014   [facility]  . DILATION AND CURETTAGE OF UTERUS  2012  . MOUTH SURGERY    . MOUTH SURGERY    . UTERINE FIBROID SURGERY  2012         Family History  Problem Relation Age of Onset  . Cancer Mother        breast, ovarian/uterine  . Cancer Maternal Grandmother        cervical and uterine cancer  No bleeding or clotting disorders  Social History Social History        Tobacco Use  . Smoking status: Former Smoker    Packs/day: 3.00    Years: 3.00    Pack  years: 9.00    Types: Cigarettes    Last attempt to quit: 1993    Years since quitting: 26.2  . Smokeless tobacco: Former Neurosurgeon  . Tobacco comment: tobacco use - no   Substance Use Topics  . Alcohol use: No    Comment: quit drinking years ago, used to drink on weekends  . Drug use: No          Allergies  Allergen Reactions  . Diphenhydramine Hcl     Increased heart rate  . Latex Other (See Comments)    Blisters,then peels skin  . Montelukast Sodium Other (See Comments)    Jittery  . Pamabrom     This medication is in midol and caused anuria  . Simvastatin Other (See Comments)    dizziness  . Sulfonamide Derivatives     vomiting  . Wellbutrin [Bupropion]     Caused major depression and SUICIDAL IDEATION.          Current Outpatient Medications  Medication Sig Dispense Refill  . acetaminophen (TYLENOL) 500 MG tablet Take 500 mg by mouth every 6 (six) hours as needed.    Marland Kitchen  aspirin 325 MG tablet Take 325 mg by mouth daily.    Marland Kitchen atorvastatin (LIPITOR) 10 MG tablet TAKE 1 TABLET (10 MG TOTAL) BY MOUTH DAILY. 30 tablet 5  . BYSTOLIC 10 MG tablet TAKE 1 TABLET (10 MG TOTAL) BY MOUTH DAILY. 90 tablet 2  . cetirizine (ZYRTEC) 10 MG tablet Take 10 mg by mouth as needed.      . DULoxetine (CYMBALTA) 30 MG capsule Take 3 capsules (90 mg total) daily by mouth. 270 capsule 3  . etodolac (LODINE) 400 MG tablet Take 1 tablet (400 mg total) by mouth 2 (two) times daily. 60 tablet 6  . FLOVENT HFA 110 MCG/ACT inhaler IHALE 2 PUFFS BY MOUTH TWICE A DAY (Patient taking differently: IHALE 2 PUFFS BY MOUTH AS NEEDED.) 12 Inhaler 6  . fluconazole (DIFLUCAN) 200 MG tablet Take 1 tablet (200 mg total) by mouth once a week. For maintenance 4 tablet 11  . furosemide (LASIX) 20 MG tablet Take 1 tablet (20 mg total) by mouth daily as needed. For up to 3 days as needed for leg swelling, as needed 15 tablet 1  . hydrOXYzine (ATARAX/VISTARIL) 25 MG tablet Take 1 tablet  (25 mg total) by mouth every 8 (eight) hours as needed for anxiety. 30 tablet 2  . levothyroxine (SYNTHROID, LEVOTHROID) 50 MCG tablet TAKE 1 TABLET (50 MCG TOTAL) BY MOUTH DAILY. 30 tablet 12  . LORazepam (ATIVAN) 1 MG tablet Take 1 mg by mouth as directed.    . meclizine (ANTIVERT) 25 MG tablet Take 1 tablet (25 mg total) by mouth 3 (three) times daily as needed. 60 tablet 1  . nitroGLYCERIN (NITROSTAT) 0.4 MG SL tablet Place 0.4 mg under the tongue every 5 (five) minutes as needed.    . nystatin (MYCOSTATIN/NYSTOP) 100000 UNIT/GM POWD One application twice a day to rash 1 Bottle 12  . omeprazole (PRILOSEC) 20 MG capsule TAKE 1 CAPSULE EVERY DAY 90 capsule 3  . PROAIR HFA 108 (90 BASE) MCG/ACT inhaler 2 PUFFS PUFF, INHALATION, EVERY 4 HRS AS NEEDED 8.5 Inhaler 6  . topiramate (TOPAMAX) 50 MG tablet Take 1/2 tablet daily as directed. 30 tablet 6  . traMADol (ULTRAM) 50 MG tablet Take 1 tablet (50 mg total) by mouth every 6 (six) hours as needed. 120 tablet 5  . triamcinolone cream (KENALOG) 0.1 % Apply 1 application topically daily as needed.  1  . verapamil (CALAN-SR) 240 MG CR tablet TAKE 1 TABLET BY MOUTH DAILY 30 tablet 5   No current facility-administered medications for this visit.       REVIEW OF SYSTEMS (Negative unless checked)  Constitutional: Weight loss  Fever  Chills Cardiac: Chest pain   Chest pressure   Palpitations   Shortness of breath when laying flat   Shortness of breath at rest   Shortness of breath with exertion. Vascular:  Pain in legs with walking   Pain in legs at rest   Pain in legs when laying flat   Claudication   Pain in feet when walking  Pain in feet at rest  Pain in feet when laying flat   History of DVT   Phlebitis   Swelling in legs   Varicose veins   Non-healing ulcers Pulmonary:   Uses home oxygen   Productive cough   Hemoptysis   Wheeze  COPD   Asthma Neurologic:  Dizziness  Blackouts    Seizures   History of stroke   History of TIA  Aphasia   Temporary blindness     Dysphagia   Weakness or numbness in arms   Weakness or numbness in legs Musculoskeletal:  Arthritis   Joint swelling   Joint pain   Low back pain Hematologic:  Easy bruising  Easy bleeding   Hypercoagulable state   Anemic  Hepatitis Gastrointestinal:  Blood in stool   Vomiting blood  Gastroesophageal reflux/heartburn   Abdominal pain Genitourinary:  Chronic kidney disease   Difficult urination  Frequent urination  Burning with urination   Hematuria Skin:  Rashes   Ulcers   Wounds Psychological:  History of anxiety    History of major depression    Physical Examination  BP (!) 143/78 (BP Location: Left Arm, Patient Position: Sitting)   Pulse 82   Resp 17   Ht  (1.6 m)   Wt 253 lb (114.8 kg)   BMI 44.82 kg/m  Gen:  WD/WN, NAD Head: [place]/AT, No temporalis wasting. Ear/Nose/Throat: Hearing grossly intact, nares w/o erythema or drainage Eyes: Conjunctiva clear. Sclera non-icteric Neck: Supple.  Trachea midline Pulmonary:  Good air movement, no use of accessory muscles.  Cardiac: RRR, no JVD Vascular:  Vessel Right Left  Radial Palpable Palpable                                    Musculoskeletal: M/S 5/5 throughout.  No deformity or atrophy.  Trace to 1+ left lower extremity edema. Neurologic: Sensation grossly intact in extremities.  Symmetrical.  Speech is fluent.  Psychiatric: Judgment intact, Mood & affect appropriate for pt's clinical situation. Dermatologic: No rashes or ulcers noted.  No cellulitis or open wounds.       Labs Recent Results (from the past 2160 hour(s))  HM DIABETES EYE EXAM     Status: None   Collection Time: 02/09/18  4:19 PM  Result Value Ref Range   HM Diabetic Eye Exam No Retinopathy No Retinopathy    Radiology No results found.  Assessment/Plan Hypertension blood pressure control  important in reducing the progression of atherosclerotic disease. On appropriate oral medications.   Baker's cyst of knee, left I suspect this is a significant cause of her left lower extremity swelling.  It sounds like she had a ruptured Baker's cyst as part of her symptomatology.  This takes many weeks to heal.  We are still going to assess her for venous disease and she may also have lymphedema from chronic scarring of the lymphatic channels.  Hyperlipidemia lipid control important in reducing the progression of atherosclerotic disease. Continue statin therapy   Morbid obesity Worsens lower extremity swelling   Swelling of limb Better.  Duplex findings today show no significant venous disease requiring intervention with a Bakers cyst in the left popliteal space.  At this point, she can wear compression stockings and elevate her legs as needed.  She says she is not going to wear compression stockings because her swelling is not that bad so that is reasonable.  I will see her back as needed    Festus Barren, MD  04/17/2018 3:49 PM    This note was created with Dragon medical transcription system.  Any errors from dictation are purely unintentional

## 2018-04-17 NOTE — Assessment & Plan Note (Addendum)
Better.  Duplex findings today show no significant venous disease requiring intervention with a Bakers cyst in the left popliteal space.  At this point, she can wear compression stockings and elevate her legs as needed.  She says she is not going to wear compression stockings because her swelling is not that bad so that is reasonable.  I will see her back as needed

## 2018-04-18 ENCOUNTER — Telehealth: Payer: Self-pay | Admitting: *Deleted

## 2018-04-18 NOTE — Telephone Encounter (Signed)
-----   Message from Earline Mayotte, MD sent at 04/18/2018 11:00 AM EDT ----- Please notify the patient the biopsy results have been reviewed and are benign.

## 2018-04-18 NOTE — Telephone Encounter (Signed)
Patient contacted today and notified as instructed. She verbalizes understanding.  

## 2018-04-18 NOTE — Telephone Encounter (Signed)
The patient underwent a stereotactic biopsy for a density on mammograms completed last month.  Pathology results are noted below.  No evidence of atypia.  Apr 13, 2018 biopsy:  Ten core samples were obtained of the area of distortion with a 9g vacuum assisted Suros Eviva biopsy needle. No specimen radiograph was performed. MEASURE:  26 x 25 x 4 mm  (Volume: 2.6 cc.).  Cylinder biopsy marker placed.  POST PROCEDURE MAMMOGRAM:Unilateral right ML mammogram with CC tomosynthesis was performed in a separate room demonstrating that the biopsy marking clip is at the anterolateral aspect of the distortion. A: Breast, right, stereotactic guided core needle biopsy - Usual ductal hyperplasia - Cystic and papillary apocrine metaplasia - Microscopic intraductal papilloma - Sclerosing adenosis - Duct ectasia - No atypia, in situ or invasive carcinoma identified

## 2018-05-08 ENCOUNTER — Telehealth: Payer: Self-pay | Admitting: Family Medicine

## 2018-05-08 DIAGNOSIS — F331 Major depressive disorder, recurrent, moderate: Secondary | ICD-10-CM

## 2018-05-08 MED ORDER — VERAPAMIL HCL ER 240 MG PO CP24: 240.0000 mg | ORAL_CAPSULE | Freq: Every day | ORAL | 5 refills | Status: DC

## 2018-05-08 NOTE — Telephone Encounter (Signed)
[facility] from [facility] called to report that there is a shortage of Verapamil  daily tablets - uncertain duration on shortage, recommend to switch to Verapamil  ER 24 hr capsule instead, phoned rx in, added refills, they will notify patient and in future notify us if receive tablets and can switch back.  Saralyn Pilar, DO [facility] 05/08/2018, 12:34 PM

## 2018-05-21 ENCOUNTER — Other Ambulatory Visit: Payer: Self-pay | Admitting: Family Medicine

## 2018-05-21 DIAGNOSIS — F419 Anxiety disorder, unspecified: Secondary | ICD-10-CM

## 2018-05-21 DIAGNOSIS — I10 Essential (primary) hypertension: Secondary | ICD-10-CM

## 2018-05-21 DIAGNOSIS — R4182 Altered mental status, unspecified: Secondary | ICD-10-CM

## 2018-05-21 DIAGNOSIS — B372 Candidiasis of skin and nail: Secondary | ICD-10-CM

## 2018-05-21 MED ORDER — FLUCONAZOLE 200 MG PO TABS: 200.0000 mg | ORAL_TABLET | ORAL | 11 refills | Status: DC

## 2018-05-21 MED ORDER — VERAPAMIL HCL ER 240 MG PO CP24: 240.0000 mg | ORAL_CAPSULE | Freq: Every day | ORAL | 5 refills | Status: DC

## 2018-05-21 MED ORDER — TOPIRAMATE 50 MG PO TABS: ORAL_TABLET | ORAL | 6 refills | Status: DC

## 2018-05-21 NOTE — Telephone Encounter (Signed)
Pt needs refills on verapamil soft capsules, fluconazole and if possible 25 mg topiramate.  [facility] [place] and her call back number is [phone]

## 2018-06-17 ENCOUNTER — Other Ambulatory Visit: Payer: Self-pay | Admitting: Family Medicine

## 2018-06-17 DIAGNOSIS — F4310 Post-traumatic stress disorder, unspecified: Secondary | ICD-10-CM

## 2018-06-17 DIAGNOSIS — F331 Major depressive disorder, recurrent, moderate: Secondary | ICD-10-CM

## 2018-07-02 ENCOUNTER — Other Ambulatory Visit: Payer: Self-pay | Admitting: Family Medicine

## 2018-07-02 DIAGNOSIS — E034 Atrophy of thyroid (acquired): Secondary | ICD-10-CM

## 2018-07-02 MED ORDER — LEVOTHYROXINE SODIUM 50 MCG PO TABS: ORAL_TABLET | ORAL | 12 refills | Status: DC

## 2018-07-02 NOTE — Telephone Encounter (Signed)
The pt was notified that the medication was sent.

## 2018-07-02 NOTE — Telephone Encounter (Signed)
Pt needs a refill [facility] [place].  Call back number [phone]

## 2018-07-15 ENCOUNTER — Other Ambulatory Visit: Payer: Self-pay | Admitting: Family Medicine

## 2018-07-15 DIAGNOSIS — I10 Essential (primary) hypertension: Secondary | ICD-10-CM

## 2018-08-27 ENCOUNTER — Telehealth: Payer: Self-pay | Admitting: Cardiovascular Disease

## 2018-08-27 NOTE — Telephone Encounter (Signed)
Reviewed with Ward Givenshris Berge, NP. The patient does not require pre-medication or any alterations with medications.  Copy of original fax faxed back to [facility] at [phone] with Thayer Ohmhris, NP's recommendations.  Fax confirmation received.

## 2018-08-27 NOTE — Telephone Encounter (Signed)
Patient last seen by Dr. Mariah MillingGollan on 04/27/17 for chest pain- cath 04/2008 with no significant CAD. History of possible TIA's on ASA 325 mg once daily.  She was to be a PRN follow up.  No valvular issues noted.   Per fax received from the dental office that the patient is currently in the office for an extraction to be done, they are requesting:  1) Are any alterations necessary for patient's medication prior to treatment?  2) Will a pre-med be necessary, and if so what kind is best for the patient?  To Ward Givenshris Berge, NP to review.

## 2018-08-27 NOTE — Telephone Encounter (Signed)
° °  [facility] Pre-operative Risk Assessment    Request for surgical clearance:  1. What type of surgery is being performed? Patient needs extraction   2. When is this surgery scheduled? Right now   3. What type of clearance is required (medical clearance vs. Pharmacy clearance to hold med vs. Both)? Clearance medical   4. Are there any medications that need to be held prior to surgery and how long?  5. Practice name and name of physician performing surgery? [facility]   6. What is your office phone number [phone]    7.   What is your office fax number [phone]   8.   Anesthesia type (None, local, MAC, general) ?

## 2018-10-17 ENCOUNTER — Other Ambulatory Visit: Payer: Self-pay | Admitting: Family Medicine

## 2018-10-17 DIAGNOSIS — E785 Hyperlipidemia, unspecified: Secondary | ICD-10-CM

## 2018-10-24 ENCOUNTER — Ambulatory Visit (INDEPENDENT_AMBULATORY_CARE_PROVIDER_SITE_OTHER): Payer: Self-pay

## 2018-10-24 DIAGNOSIS — Z23 Encounter for immunization: Secondary | ICD-10-CM

## 2018-11-19 ENCOUNTER — Telehealth: Payer: Self-pay | Admitting: *Deleted

## 2018-11-19 NOTE — Telephone Encounter (Signed)
Patient called and her mamm[facility] was schedule at [facility] and does want to go there because she cant drive that far, [facility]

## 2018-11-20 NOTE — Telephone Encounter (Signed)
Patient does not currently have a mammogram scheduled with us. She is in Recalls for march and I have changed the recall to have her next mamm[facility].

## 2018-11-21 ENCOUNTER — Other Ambulatory Visit: Payer: Self-pay

## 2018-11-21 DIAGNOSIS — R928 Other abnormal and inconclusive findings on diagnostic imaging of breast: Secondary | ICD-10-CM

## 2018-12-18 ENCOUNTER — Ambulatory Visit
Admission: RE | Admit: 2018-12-18 | Discharge: 2018-12-18 | Disposition: A | Discharge status: Home / Self Care | Payer: Self-pay | Source: Ambulatory Visit | Attending: General Surgery | Admitting: General Surgery

## 2018-12-18 DIAGNOSIS — R928 Other abnormal and inconclusive findings on diagnostic imaging of breast: Secondary | ICD-10-CM

## 2019-02-11 ENCOUNTER — Other Ambulatory Visit: Payer: Self-pay

## 2019-02-11 DIAGNOSIS — Z1231 Encounter for screening mammogram for malignant neoplasm of breast: Secondary | ICD-10-CM

## 2019-03-21 ENCOUNTER — Ambulatory Visit: Payer: Managed Care, Other (non HMO) | Admitting: General Surgery

## 2019-03-29 ENCOUNTER — Other Ambulatory Visit: Payer: Self-pay | Admitting: Family Medicine

## 2019-03-29 DIAGNOSIS — K219 Gastro-esophageal reflux disease without esophagitis: Secondary | ICD-10-CM

## 2019-04-26 ENCOUNTER — Other Ambulatory Visit: Payer: Self-pay

## 2019-04-26 ENCOUNTER — Telehealth: Payer: Self-pay | Admitting: Family Medicine

## 2019-04-26 DIAGNOSIS — J4540 Moderate persistent asthma, uncomplicated: Secondary | ICD-10-CM

## 2019-04-26 NOTE — Telephone Encounter (Signed)
Pt called  Requesting refill on Albuterol, Flovent  [facility]ed into  [facility] [place]

## 2019-04-29 ENCOUNTER — Ambulatory Visit
Admission: RE | Admit: 2019-04-29 | Discharge: 2019-04-29 | Disposition: A | Discharge status: Home / Self Care | Payer: Self-pay | Source: Ambulatory Visit | Attending: General Surgery | Admitting: General Surgery

## 2019-04-29 ENCOUNTER — Other Ambulatory Visit: Payer: Self-pay

## 2019-04-29 DIAGNOSIS — Z1231 Encounter for screening mammogram for malignant neoplasm of breast: Secondary | ICD-10-CM | POA: Insufficient documentation

## 2019-04-29 MED ORDER — FLUTICASONE PROPIONATE HFA 110 MCG/ACT IN AERO: 2.0000 | INHALATION_SPRAY | Freq: Two times a day (BID) | RESPIRATORY_TRACT | 5 refills | Status: AC

## 2019-04-29 MED ORDER — ALBUTEROL SULFATE HFA 108 (90 BASE) MCG/ACT IN AERS: 2.0000 | INHALATION_SPRAY | RESPIRATORY_TRACT | 3 refills | Status: AC | PRN

## 2019-05-07 ENCOUNTER — Ambulatory Visit (INDEPENDENT_AMBULATORY_CARE_PROVIDER_SITE_OTHER): Payer: Managed Care, Other (non HMO) | Admitting: General Surgery

## 2019-05-07 ENCOUNTER — Other Ambulatory Visit: Payer: Self-pay

## 2019-05-07 ENCOUNTER — Other Ambulatory Visit: Payer: Self-pay | Admitting: Family Medicine

## 2019-05-07 ENCOUNTER — Encounter: Payer: Self-pay | Admitting: General Surgery

## 2019-05-07 VITALS — BP 128/75 | HR 75 | Temp 97.7°F | Resp 16 | Ht 59.0 in | Wt 262.4 lb

## 2019-05-07 DIAGNOSIS — Z803 Family history of malignant neoplasm of breast: Secondary | ICD-10-CM | POA: Diagnosis not present

## 2019-05-07 DIAGNOSIS — Z1231 Encounter for screening mammogram for malignant neoplasm of breast: Secondary | ICD-10-CM

## 2019-05-07 DIAGNOSIS — E785 Hyperlipidemia, unspecified: Secondary | ICD-10-CM

## 2019-05-07 NOTE — Progress Notes (Signed)
Patient ID: Elizabeth Wyatt, female   DOB: 07/27/1956, 63 y.o.   MRN: [id number]  Chief Complaint  Patient presents with  . Follow-up    f/u 1 year mammogram    HPI Elizabeth Wyatt is a 63 y.o. female.  One year follow up mammogram. No complaints.  Patient has been followed for several years due to a history of breast cancer in her mother at age less than 35.  The patient reports no changes in her breast that she is aware of.  She has not seen her GYN in several years. HPI  Past Medical History:  Diagnosis Date  . Allergy   . Asthma   . Broken rib   . CAD (coronary artery disease)   . Family history of malignant neoplasm of breast   . HLD (hyperlipidemia)   . Obesity   . Osteoarthritis   . Personal history of colonic polyps   . Personal history of tobacco use, presenting hazards to health   . Special screening for malignant neoplasms, colon   . TIA (transient ischemic attack)     Past Surgical History:  Procedure Laterality Date  . BREAST BIOPSY Right 2009  . BREAST BIOPSY Right 04/2018   ductal hyperplasia, cystic and papillary metaplasia, microscopic intraducal papilloma, sclerosing adenosis  . BREAST MASS EXCISION Right 2009  . CARDIAC CATHETERIZATION  2010   Dr. Mariah Milling with [facility]  . COLONOSCOPY  2008, 2014   [facility]  . DILATION AND CURETTAGE OF UTERUS  2012  . MOUTH SURGERY    . MOUTH SURGERY    . UTERINE FIBROID SURGERY  2012    Family History  Problem Relation Age of Onset  . Cancer Mother        breast, ovarian/uterine  . Breast cancer Mother 70  . Cancer Maternal Grandmother        cervical and uterine cancer    Social History Social History   Tobacco Use  . Smoking status: Former Smoker    Packs/day: 3.00    Years: 3.00    Pack years: 9.00    Types: Cigarettes    Last attempt to quit: 1993    Years since quitting: 27.4  . Smokeless tobacco: Former Neurosurgeon  . Tobacco comment: tobacco use - no   Substance Use Topics  . Alcohol use: No   Comment: quit drinking years ago, used to drink on weekends  . Drug use: No    Allergies  Allergen Reactions  . Diphenhydramine Hcl     Increased heart rate  . Guaifenesin     Other reaction(s): Other (See Comments) Other reaction(s): Other (See Comments) Nightmares Nightmares  . Latex Other (See Comments)    Blisters,then peels skin  . Montelukast Sodium Other (See Comments)    Jittery  . Pamabrom     This medication is in midol and caused anuria  . Simvastatin Other (See Comments)    dizziness  . Sulfonamide Derivatives     vomiting  . Wellbutrin [Bupropion]     Caused major depression and SUICIDAL IDEATION.    Current Outpatient Medications  Medication Sig Dispense Refill  . acetaminophen (TYLENOL) 500 MG tablet Take 500 mg by mouth every 6 (six) hours as needed.    Marland Kitchen albuterol (PROAIR HFA) 108 (90 Base) MCG/ACT inhaler Inhale 2 puffs into the lungs every 4 (four) hours as needed for wheezing or shortness of breath. 8.5 Inhaler 3  . aspirin 325 MG tablet Take 325 mg by mouth daily.    Marland Kitchen  atorvastatin (LIPITOR) 10 MG tablet TAKE 1 TABLET (10 MG TOTAL) BY MOUTH DAILY. 30 tablet 5  . BYSTOLIC 10 MG tablet TAKE 1 TABLET (10 MG TOTAL) BY MOUTH DAILY. 30 tablet 8  . cetirizine (ZYRTEC) 10 MG tablet Take 10 mg by mouth as needed.      . DULoxetine (CYMBALTA) 30 MG capsule TAKE 3 CAPSULES (90 MG TOTAL) DAILY BY MOUTH. 270 capsule 3  . etodolac (LODINE) 400 MG tablet Take 1 tablet (400 mg total) by mouth 2 (two) times daily. 60 tablet 6  . fluconazole (DIFLUCAN) 200 MG tablet Take 1 tablet (200 mg total) by mouth once a week. For maintenance 4 tablet 11  . fluticasone (FLOVENT HFA) 110 MCG/ACT inhaler Inhale 2 puffs into the lungs 2 (two) times a day. 12 g 5  . hydrOXYzine (ATARAX/VISTARIL) 25 MG tablet Take 1 tablet (25 mg total) by mouth every 8 (eight) hours as needed for anxiety. 30 tablet 2  . levothyroxine (SYNTHROID, LEVOTHROID) 50 MCG tablet TAKE 1 TABLET (50 MCG TOTAL) BY  MOUTH DAILY. 30 tablet 12  . LORazepam (ATIVAN) 1 MG tablet Take 1 mg by mouth as directed.    . meclizine (ANTIVERT) 25 MG tablet Take 1 tablet (25 mg total) by mouth 3 (three) times daily as needed. 60 tablet 1  . nitroGLYCERIN (NITROSTAT) 0.4 MG SL tablet Place 0.4 mg under the tongue every 5 (five) minutes as needed.    . nystatin (MYCOSTATIN/NYSTOP) 100000 UNIT/GM POWD One application twice a day to rash 1 Bottle 12  . omeprazole (PRILOSEC OTC) 20 MG tablet Take 1 tablet (20 mg total) by mouth daily. 30 tablet 5  . topiramate (TOPAMAX) 50 MG tablet Take 1/2 tablet daily as directed. 30 tablet 6  . triamcinolone cream (KENALOG) 0.1 % Apply 1 application topically daily as needed.  1  . verapamil (VERELAN PM) 240 MG 24 hr capsule Take 1 capsule (240 mg total) by mouth at bedtime. 30 capsule 5   No current facility-administered medications for this visit.     Review of Systems Review of Systems  Constitutional: Negative.   Respiratory: Negative.   Cardiovascular: Negative.     Blood pressure 128/75, pulse 75, temperature 97.7 F (36.5 C), temperature source Temporal, resp. rate 16, height 4\' 11"  (1.499 m), weight 262 lb 6.4 oz (119 kg), SpO2 97 %.  Physical Exam Physical Exam Constitutional:      Appearance: She is well-developed.  Eyes:     General: No scleral icterus.    Conjunctiva/sclera: Conjunctivae normal.  Neck:     Musculoskeletal: Normal range of motion.  Cardiovascular:     Rate and Rhythm: Normal rate and regular rhythm.     Heart sounds: Normal heart sounds.  Pulmonary:     Effort: Pulmonary effort is normal.     Breath sounds: Normal breath sounds.  Chest:     Breasts:        Right: No inverted nipple, mass, nipple discharge, skin change or tenderness.        Left: No inverted nipple, mass, nipple discharge, skin change or tenderness.  Lymphadenopathy:     Cervical: No cervical adenopathy.  Skin:    General: Skin is warm and dry.  Neurological:      Mental Status: She is alert and oriented to person, place, and time.     Data Reviewed Mammograms of February 27, 2019 were independently reviewed.  No interval change.  BI-RADS-1.  Assessment No evidence of breast cancer.  Plan The opportunity to have her annual screening mammogram completed and physical exam with her PCP was offered and declined.  We will follow up with a mammogram in May 2021.  The patient has been encouraged to contact Dr. Valentino Saxon, her gynecologist to arrange for a GYN exam and Pap smear.   HPI, Physical Exam, Assessment and Plan have been scribed under the direction and in the presence of Earline Mayotte, MD. Elizabeth Wyatt, CMA  I have completed the exam and reviewed the above documentation for accuracy and completeness.  I agree with the above.  Museum/gallery conservator has been used and any errors in dictation or transcription are unintentional.  Donnalee Curry, M.D., F.A.C.S.  Elizabeth Wyatt 05/07/2019, 7:57 PM

## 2019-05-07 NOTE — Patient Instructions (Signed)
We will follow up with a mammogram in May 2021.

## 2019-05-12 ENCOUNTER — Other Ambulatory Visit: Payer: Self-pay | Admitting: Family Medicine

## 2019-05-12 DIAGNOSIS — R4182 Altered mental status, unspecified: Secondary | ICD-10-CM

## 2019-05-18 ENCOUNTER — Other Ambulatory Visit: Payer: Self-pay | Admitting: Family Medicine

## 2019-05-18 DIAGNOSIS — I10 Essential (primary) hypertension: Secondary | ICD-10-CM

## 2019-05-21 ENCOUNTER — Other Ambulatory Visit: Payer: Self-pay | Admitting: Family Medicine

## 2019-05-21 DIAGNOSIS — I10 Essential (primary) hypertension: Secondary | ICD-10-CM

## 2019-07-12 ENCOUNTER — Encounter: Payer: Self-pay | Admitting: General Surgery

## 2019-07-18 ENCOUNTER — Other Ambulatory Visit: Payer: Self-pay | Admitting: Family Medicine

## 2019-07-18 DIAGNOSIS — F4310 Post-traumatic stress disorder, unspecified: Secondary | ICD-10-CM

## 2019-07-18 DIAGNOSIS — F331 Major depressive disorder, recurrent, moderate: Secondary | ICD-10-CM

## 2019-07-18 DIAGNOSIS — I10 Essential (primary) hypertension: Secondary | ICD-10-CM

## 2019-07-23 ENCOUNTER — Other Ambulatory Visit: Payer: Self-pay | Admitting: Family Medicine

## 2019-07-23 DIAGNOSIS — B372 Candidiasis of skin and nail: Secondary | ICD-10-CM

## 2019-10-01 NOTE — Progress Notes (Signed)
Patient ID: [name], female   DOB: February 15, 1956, 63 y.o.   MRN: [id number] Cardiology Office Note  Date:  10/02/2019   ID:  [name], DOB 29-Jun-1956, MRN [id number]  PCP:  Smitty Cords, DO   Chief Complaint  Patient presents with  . other    12 month follow up. Meds reviewed by the pt. verbally. "doing well."     HPI:  [name] is a 63 yo woman with PMH of  morbid obesity,  hypertension,  hyperlipidemia,  chronic chest pain  cardiac catheterization May 2009   no significant coronary artery disease (circumflex coming off the ostium of the RCA)  significant stress at home, son who has autism. Separated from her husband Who presents for routine followup of her chest pain symptoms.   Stress at home Son using cocaine, he is autistic Still bad situation at home She presents today in a wheelchair, legs continue to be weak  no recent blood work Has not seen PMD recently  Most of the issues stem from stressors at home No active chest pain, shortness of breath,  Previous visits with dizziness, nausea diarrhea  EKG personally reviewed by myself on today's visit  shows normal sinus rhythm with rate 72 bpm, no significant ST or T-wave changes    Other past medical history reviewed Previous episode of paralysis, aphasia approximately one month ago. Woke up, could not move, could not open her eyes, could not speak. No focal deficits. She reports that her per minute was working but she could not communicate. After several hours was able to call for help  Reports having similar symptoms many years ago and was kept in the hospital at that time. 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   CT scan of the head showed no pathology She is scheduled to have carotid ultrasound, possibly MRI, follow-up with neurology  Stress test 04/2008:  Cardiac catheter at the same time   PMH:   has a past medical history  of Allergy, Asthma, Broken rib, CAD (coronary artery disease), Family history of malignant neoplasm of breast, HLD (hyperlipidemia), Obesity, Osteoarthritis, Personal history of colonic polyps, Personal history of tobacco use, presenting hazards to health, Special screening for malignant neoplasms, colon, and TIA (transient ischemic attack).  PSH:    Past Surgical History:  Procedure Laterality Date  . BREAST BIOPSY Right 2009  . BREAST BIOPSY Right 04/2018   ductal hyperplasia, cystic and papillary metaplasia, microscopic intraducal papilloma, sclerosing adenosis  . BREAST MASS EXCISION Right 2009  . CARDIAC CATHETERIZATION  2010   Dr. Mariah Milling with [facility]  . COLONOSCOPY  2008, 2014   [facility]  . DILATION AND CURETTAGE OF UTERUS  2012  . MOUTH SURGERY    . MOUTH SURGERY    . UTERINE FIBROID SURGERY  2012    Current Outpatient Medications  Medication Sig Dispense Refill  . acetaminophen (TYLENOL) 500 MG tablet Take 500 mg by mouth every 6 (six) hours as needed.    Marland Kitchen albuterol (PROAIR HFA) 108 (90 Base) MCG/ACT inhaler Inhale 2 puffs into the lungs every 4 (four) hours as needed for wheezing or shortness of breath. 8.5 Inhaler 3  . aspirin 325 MG tablet Take 325 mg by mouth daily.    Marland Kitchen atorvastatin (LIPITOR) 10 MG tablet TAKE 1 TABLET (10 MG TOTAL) BY MOUTH DAILY. 30 tablet 5  . BYSTOLIC 10 MG tablet TAKE 1 TABLET BY MOUTH EVERY DAY 30  tablet 3  . cetirizine (ZYRTEC) 10 MG tablet Take 10 mg by mouth as needed.      . DULoxetine (CYMBALTA) 30 MG capsule TAKE 3 CAPSULES (90 MG TOTAL) DAILY BY MOUTH. 90 capsule 3  . etodolac (LODINE) 400 MG tablet Take 1 tablet (400 mg total) by mouth 2 (two) times daily. 60 tablet 6  . fluconazole (DIFLUCAN) 200 MG tablet TAKE 1 TABLET (200 MG TOTAL) BY MOUTH ONCE A WEEK. FOR MAINTENANCE 4 tablet 11  . fluticasone (FLOVENT HFA) 110 MCG/ACT inhaler Inhale 2 puffs into the lungs 2 (two) times a day. 12 g 5  . hydrOXYzine (ATARAX/VISTARIL) 25 MG tablet Take 1  tablet (25 mg total) by mouth every 8 (eight) hours as needed for anxiety. 30 tablet 2  . levothyroxine (SYNTHROID, LEVOTHROID) 50 MCG tablet TAKE 1 TABLET (50 MCG TOTAL) BY MOUTH DAILY. 30 tablet 12  . LORazepam (ATIVAN) 1 MG tablet Take 1 mg by mouth as directed.    . meclizine (ANTIVERT) 25 MG tablet Take 1 tablet (25 mg total) by mouth 3 (three) times daily as needed. 60 tablet 1  . nitroGLYCERIN (NITROSTAT) 0.4 MG SL tablet Place 0.4 mg under the tongue every 5 (five) minutes as needed.    . nystatin (MYCOSTATIN/NYSTOP) 100000 UNIT/GM POWD One application twice a day to rash 1 Bottle 12  . omeprazole (PRILOSEC OTC) 20 MG tablet Take 1 tablet (20 mg total) by mouth daily. 30 tablet 5  . topiramate (TOPAMAX) 50 MG tablet TAKE 1/2 TABLET DAILY AS DIRECTED. 30 tablet 6  . triamcinolone cream (KENALOG) 0.1 % Apply 1 application topically daily as needed.  1  . verapamil (VERELAN PM) 240 MG 24 hr capsule Take 1 capsule (240 mg total) by mouth at bedtime. 30 capsule 5   No current facility-administered medications for this visit.     Allergies:   Diphenhydramine hcl, Guaifenesin, Latex, Montelukast sodium, Pamabrom, Simvastatin, Sulfonamide derivatives, and Wellbutrin [bupropion]   Social History:  The patient  reports that she quit smoking about 27 years ago. Her smoking use included cigarettes. She has a 9.00 pack-year smoking history. She has quit using smokeless tobacco. She reports that she does not drink alcohol or use drugs.   Family History:   family history includes Breast cancer (age of onset: 4236) in her mother; Cancer in her maternal grandmother and mother.    Review of Systems: Review of Systems  Constitutional: Negative.   HENT: Negative.   Respiratory: Negative.   Cardiovascular: Negative.   Gastrointestinal: Negative.   Musculoskeletal: Positive for joint pain.  Neurological: Negative.   Psychiatric/Behavioral: Negative.   All other systems reviewed and are  negative.   PHYSICAL EXAM: VS:  BP 110/60 (BP Location: Left Arm, Patient Position: Sitting, Cuff Size: Large)   Pulse 72   Temp 97.9 F (36.6 C)   Ht 4\' 10"  (1.473 m)   Wt 255 lb 8 oz (115.9 kg)   BMI 53.40 kg/m  , BMI Body mass index is 53.4 kg/m. Constitutional:  oriented to person, place, and time. No distress.  Obese, in wheelchair HENT:  Head: Grossly normal Eyes:  no discharge. No scleral icterus.  Neck: No JVD, no carotid bruits  Cardiovascular: Regular rate and rhythm, no murmurs appreciated Pulmonary/Chest: Clear to auscultation bilaterally, no wheezes or rails Abdominal: Soft.  no distension.  no tenderness.  Musculoskeletal: Normal range of motion Neurological:  normal muscle tone. Coordination normal. No atrophy Skin: Skin warm and dry Psychiatric: normal affect,  pleasant   Recent Labs: No results found for requested labs within last 8760 hours.    Lipid Panel Lab Results  Component Value Date   CHOL 164 04/15/2016   HDL 94 04/15/2016   LDLCALC 44 04/15/2016   TRIG 128 04/15/2016     Wt Readings from Last 3 Encounters:  10/02/19 255 lb 8 oz (115.9 kg)  05/07/19 262 lb 6.4 oz (119 kg)  04/17/18 253 lb (114.8 kg)     ASSESSMENT AND PLAN:  transient cerebral ischemias - Recommend she decrease aspirin 325 mg daily down to 81 mg daily  HYPERTENSION, BENIGN - Plan: EKG 12-Lead Blood pressure is well controlled on today's visit. No changes made to the medications. stable  Hyperlipidemia Was controlled, Need new labs She reports that she will follow-up with primary care for annual lab work.  Prefers to do it all at one time  Atypical chest pain No sx, no further workup  Morbid obesity due to excess calories (HCC) Down 10 pounds.  Thinks that she is eating less We have encouraged continued exercise, careful diet management in an effort to lose weight.  ADJ DISORDER WITH MIXED ANXIETY & DEPRESSED MOOD Prior history of Significant stress at home,   separated from her husband, disabled child Still with significant stress issues, discussed with her   Total encounter time more than 25 minutes  Greater than 50% was spent in counseling and coordination of care with the patient  Disposition:   F/U  prn   Orders Placed This Encounter  Procedures  . EKG 12-Lead     Signed, Esmond Plants, M.D., Ph.D. 10/02/2019  Jessie, Bartholomew

## 2019-10-02 ENCOUNTER — Other Ambulatory Visit: Payer: Self-pay

## 2019-10-02 ENCOUNTER — Ambulatory Visit (INDEPENDENT_AMBULATORY_CARE_PROVIDER_SITE_OTHER): Payer: 59 | Admitting: Cardiovascular Disease

## 2019-10-02 ENCOUNTER — Encounter: Payer: Self-pay | Admitting: Cardiovascular Disease

## 2019-10-02 VITALS — BP 110/60 | HR 72 | Temp 97.9°F | Ht <= 58 in | Wt 255.5 lb

## 2019-10-02 DIAGNOSIS — R079 Chest pain, unspecified: Secondary | ICD-10-CM | POA: Diagnosis not present

## 2019-10-02 DIAGNOSIS — E782 Mixed hyperlipidemia: Secondary | ICD-10-CM | POA: Diagnosis not present

## 2019-10-02 DIAGNOSIS — I10 Essential (primary) hypertension: Secondary | ICD-10-CM | POA: Diagnosis not present

## 2019-10-02 MED ORDER — ASPIRIN EC 81 MG PO TBEC: 81.0000 mg | DELAYED_RELEASE_TABLET | Freq: Every day | ORAL | Status: AC

## 2019-10-02 NOTE — Patient Instructions (Addendum)
Need visit with OB/GYN Visit with PMD for labs    Medication Instructions:  Decrease the asa down to 81 mg daily  If you need a refill on your cardiac medications before your next appointment, please call your pharmacy.    Lab work: No new labs needed   If you have labs (blood work) drawn today and your tests are completely normal, you will receive your results only by: Marland Kitchen MyChart Message (if you have MyChart) OR . A paper copy in the mail If you have any lab test that is abnormal or we need to change your treatment, we will call you to review the results.   Testing/Procedures: No new testing needed   Follow-Up: At [facility], you and your health needs are our priority.  As part of our continuing mission to provide you with exceptional heart care, we have created designated Provider Care Teams.  These Care Teams include your primary Cardiologist (physician) and Advanced Practice Providers (APPs -  Physician Assistants and Nurse Practitioners) who all work together to provide you with the care you need, when you need it.  . You will need a follow up appointment as needed  . Providers on your designated Care Team:   . Murray Hodgkins, NP . Christell Faith, PA-C . Marrianne Mood, PA-C  Any Other Special Instructions Will Be Listed Below (If Applicable).  For educational health videos Log in to : [email or web address] Or : [email or web address], password : [email or web address]

## 2019-10-20 ENCOUNTER — Other Ambulatory Visit: Payer: Self-pay | Admitting: Family Medicine

## 2019-10-20 DIAGNOSIS — K219 Gastro-esophageal reflux disease without esophagitis: Secondary | ICD-10-CM

## 2019-11-30 ENCOUNTER — Other Ambulatory Visit: Payer: Self-pay | Admitting: Family Medicine

## 2019-11-30 DIAGNOSIS — F4310 Post-traumatic stress disorder, unspecified: Secondary | ICD-10-CM

## 2019-11-30 DIAGNOSIS — I10 Essential (primary) hypertension: Secondary | ICD-10-CM

## 2019-11-30 DIAGNOSIS — F331 Major depressive disorder, recurrent, moderate: Secondary | ICD-10-CM

## 2019-12-03 ENCOUNTER — Other Ambulatory Visit: Payer: Self-pay | Admitting: Family Medicine

## 2019-12-03 DIAGNOSIS — E785 Hyperlipidemia, unspecified: Secondary | ICD-10-CM

## 2020-02-22 ENCOUNTER — Other Ambulatory Visit: Payer: Self-pay | Admitting: Family Medicine

## 2020-02-22 DIAGNOSIS — E034 Atrophy of thyroid (acquired): Secondary | ICD-10-CM

## 2020-03-17 ENCOUNTER — Other Ambulatory Visit: Payer: Self-pay

## 2020-03-17 DIAGNOSIS — Z1231 Encounter for screening mammogram for malignant neoplasm of breast: Secondary | ICD-10-CM

## 2020-03-25 ENCOUNTER — Other Ambulatory Visit (HOSPITAL_COMMUNITY)
Admission: RE | Admit: 2020-03-25 | Discharge: 2020-03-25 | Disposition: A | Discharge status: Home / Self Care | Payer: 59 | Source: Ambulatory Visit | Attending: Obstetrics and Gynecology | Admitting: Obstetrics and Gynecology

## 2020-03-25 ENCOUNTER — Encounter: Payer: Self-pay | Admitting: Obstetrics and Gynecology

## 2020-03-25 ENCOUNTER — Ambulatory Visit (INDEPENDENT_AMBULATORY_CARE_PROVIDER_SITE_OTHER): Payer: 59 | Admitting: Obstetrics and Gynecology

## 2020-03-25 VITALS — BP 116/70 | HR 92 | Ht <= 58 in | Wt 260.4 lb

## 2020-03-25 DIAGNOSIS — Z6841 Body Mass Index (BMI) 40.0 and over, adult: Secondary | ICD-10-CM

## 2020-03-25 DIAGNOSIS — Z124 Encounter for screening for malignant neoplasm of cervix: Secondary | ICD-10-CM | POA: Diagnosis present

## 2020-03-25 DIAGNOSIS — R6889 Other general symptoms and signs: Secondary | ICD-10-CM

## 2020-03-25 DIAGNOSIS — Z01419 Encounter for gynecological examination (general) (routine) without abnormal findings: Secondary | ICD-10-CM | POA: Diagnosis present

## 2020-03-25 DIAGNOSIS — F331 Major depressive disorder, recurrent, moderate: Secondary | ICD-10-CM | POA: Diagnosis not present
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
## 2020-03-25 NOTE — Patient Instructions (Signed)

## 2020-03-25 NOTE — Progress Notes (Signed)
Pt present for annual exam. Pt stated that she was really nervous. No other issues.

## 2020-03-26 ENCOUNTER — Other Ambulatory Visit: Payer: Self-pay | Admitting: General Surgery

## 2020-03-26 DIAGNOSIS — Z129 Encounter for screening for malignant neoplasm, site unspecified: Secondary | ICD-10-CM

## 2020-03-29 ENCOUNTER — Encounter: Payer: Self-pay | Admitting: Obstetrics and Gynecology

## 2020-03-30 LAB — CYTOLOGY - PAP
Comment: NEGATIVE
Diagnosis: NEGATIVE
High risk HPV: NEGATIVE

## 2020-04-02 ENCOUNTER — Other Ambulatory Visit: Payer: Self-pay | Admitting: Family Medicine

## 2020-04-02 DIAGNOSIS — F331 Major depressive disorder, recurrent, moderate: Secondary | ICD-10-CM

## 2020-04-02 DIAGNOSIS — I10 Essential (primary) hypertension: Secondary | ICD-10-CM

## 2020-04-02 DIAGNOSIS — F4310 Post-traumatic stress disorder, unspecified: Secondary | ICD-10-CM

## 2020-04-02 NOTE — Telephone Encounter (Signed)
Requested  medications are  due for refill today yes  Requested medications are on the active medication list yes  Last refill 3/26  Future visit scheduled no  Last visit more than 1 year ago  Notes to clinic failed protocol due to no visit within 6 months

## 2020-04-07 ENCOUNTER — Encounter: Payer: Self-pay | Admitting: Family Medicine

## 2020-04-07 ENCOUNTER — Ambulatory Visit (INDEPENDENT_AMBULATORY_CARE_PROVIDER_SITE_OTHER): Payer: 59 | Admitting: Family Medicine

## 2020-04-07 ENCOUNTER — Other Ambulatory Visit: Payer: Self-pay | Admitting: Family Medicine

## 2020-04-07 ENCOUNTER — Other Ambulatory Visit: Payer: Self-pay

## 2020-04-07 VITALS — BP 116/70 | HR 76 | Temp 97.3°F | Resp 16 | Ht <= 58 in | Wt 256.6 lb

## 2020-04-07 DIAGNOSIS — G629 Polyneuropathy, unspecified: Secondary | ICD-10-CM | POA: Diagnosis not present

## 2020-04-07 DIAGNOSIS — M8949 Other hypertrophic osteoarthropathy, multiple sites: Secondary | ICD-10-CM

## 2020-04-07 DIAGNOSIS — M159 Polyosteoarthritis, unspecified: Secondary | ICD-10-CM

## 2020-04-07 DIAGNOSIS — F331 Major depressive disorder, recurrent, moderate: Secondary | ICD-10-CM | POA: Diagnosis not present

## 2020-04-07 DIAGNOSIS — E782 Mixed hyperlipidemia: Secondary | ICD-10-CM

## 2020-04-07 DIAGNOSIS — Z6841 Body Mass Index (BMI) 40.0 and over, adult: Secondary | ICD-10-CM

## 2020-04-07 DIAGNOSIS — I10 Essential (primary) hypertension: Secondary | ICD-10-CM

## 2020-04-07 DIAGNOSIS — E034 Atrophy of thyroid (acquired): Secondary | ICD-10-CM

## 2020-04-07 DIAGNOSIS — Z1159 Encounter for screening for other viral diseases: Secondary | ICD-10-CM

## 2020-04-07 DIAGNOSIS — B372 Candidiasis of skin and nail: Secondary | ICD-10-CM

## 2020-04-07 DIAGNOSIS — Z113 Encounter for screening for infections with a predominantly sexual mode of transmission: Secondary | ICD-10-CM

## 2020-04-07 DIAGNOSIS — F4310 Post-traumatic stress disorder, unspecified: Secondary | ICD-10-CM | POA: Diagnosis not present

## 2020-04-07 DIAGNOSIS — R7309 Other abnormal glucose: Secondary | ICD-10-CM

## 2020-04-07 MED ORDER — DICLOFENAC SODIUM 1 % EX GEL: 2.0000 g | Freq: Three times a day (TID) | CUTANEOUS | 2 refills | Status: DC | PRN

## 2020-04-07 MED ORDER — DULOXETINE HCL 30 MG PO CPEP: 90.0000 mg | ORAL_CAPSULE | Freq: Every day | ORAL | 5 refills | Status: DC

## 2020-04-07 MED ORDER — GABAPENTIN 100 MG PO CAPS: ORAL_CAPSULE | ORAL | 1 refills | Status: DC

## 2020-04-07 MED ORDER — NYSTATIN 100000 UNIT/GM EX POWD: CUTANEOUS | 2 refills | Status: DC

## 2020-04-07 NOTE — Patient Instructions (Addendum)
Thank you for coming to the office today.  Medical ID braclet  PTSD, Heart Disease, Hypertension, Asthma  I recommend - Shingrix (shingles vaccine) after age 65, when on Medicare they should cover most of it - it is 2 doses, 2-6 month apart. Can go to pharmacy to get Shingrix vaccine at that time. No rx needed.  -------------------------------------------------  Start Gabapentin 100mg  capsules, take at night for 2-3 nights only, and then increase to 2 times a day for a few days, and then may increase to 3 times a day, it may make you drowsy, if helps significantly at night only, then you can increase instead to 3 capsules at night, instead of 3 times a day - In the future if needed, we can significantly increase the dose if tolerated well, some common doses are 300mg  three times a day up to 600mg  three times a day, usually it takes several weeks or months to get to higher doses  Recommend to start taking Tylenol Extra Strength 500mg  tabs - take 1 to 2 tabs per dose (max 1000mg ) every 6-8 hours for pain (take regularly, don't skip a dose for next 7 days), max 24 hour daily dose is 6 tablets or 3000mg . In the future you can repeat the same everyday Tylenol course for 1-2 weeks at a time.   Stop taking Ibuprofen, Aleve, Naproxen  We will try the topical diclofenac / voltaren use 3 times a day as needed.  DUE for FASTING BLOOD WORK (no food or drink after midnight before the lab appointment, only water or coffee without cream/sugar on the morning of)  SCHEDULE "Lab Only" visit in the morning at the clinic for lab draw in 2 WEEKS   - Make sure Lab Only appointment is at about 1 week before your next appointment, so that results will be available  For Lab Results, once available within 2-3 days of blood draw, you can can log in to MyChart online to view your results and a brief explanation. Also, we can discuss results at next follow-up visit.   Please schedule a Follow-up Appointment to:  Return in about 2 weeks (around 04/21/2020) for Fasting lab only 2 weeks.  If you have any other questions or concerns, please feel free to call the office or send a message through MyChart. You may also schedule an earlier appointment if necessary.  Additionally, you may be receiving a survey about your experience at our office within a few days to 1 week by e-mail or mail. We value your feedback.  , DO [facility], 
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
## 2020-04-08 ENCOUNTER — Encounter: Payer: Self-pay | Admitting: Family Medicine

## 2020-04-08 NOTE — Progress Notes (Signed)
Patient had office visit with me on 04/07/20. See note for full details of that medical encounter.  Additionally there was a behavior issue addressed during the visit, with the patient expressing anger and frustration with our CMA staff here at the office. Patient expresses that there was a problem with how she was treated years ago by our CMA staff due to the patient's weight. Based on comments in the past, patient today is agitated after being roomed again. She disagrees with the rooming questions and declines PHQ questionnaire and declines to provide some of her history since it is "already in her chart". She admits that she has been lost to follow-up, 2 years since last visit since she did not want to come in because of these reasons. We had a discussion that our CMA staff and office staff and providers here do not treat any patient differently based on their weight and we try our absolute best to be respectful to every patient and care for their needs. I explained that we are here to help her and cannot exclude our CMA staff or other personnel from rooming or caring for her because we need to work together as a team.  Ultimately, I am concerned with this ongoing pattern of behavior against our CMA staff and will need to determine if the patient keeps exhibiting this behavior we may not have a good therapeutic relationship moving forward to provide care for her at this office. This has been discussed with the CMA staff and office manager. Additional incidents in the past of disrespectful behavior and cursing language have been brought to my attention in the past.  Will review future plans at our clinic and determine the appropriate plan in future.  Saralyn Pilar, DO [facility] 04/08/2020, 12:46 PM

## 2020-04-24 ENCOUNTER — Other Ambulatory Visit: Payer: Self-pay | Admitting: Family Medicine

## 2020-04-24 DIAGNOSIS — I10 Essential (primary) hypertension: Secondary | ICD-10-CM

## 2020-04-24 MED ORDER — NEBIVOLOL HCL 10 MG PO TABS: 10.0000 mg | ORAL_TABLET | Freq: Every day | ORAL | 3 refills | Status: DC

## 2020-04-24 NOTE — Telephone Encounter (Signed)
Copied from CRM [id number]. Topic: Quick Communication - Rx Refill/Question >> Apr 24, 2020  3:56 PM Dalphine Handing A wrote: Medication: BYSTOLIC 10 MG tablet  Has the patient contacted their pharmacy? yes (Agent: If no, request that the patient contact the pharmacy for the refill.) (Agent: If yes, when and what did the pharmacy advise?)contact Pcp  Preferred Pharmacy (with phone number or street name): [id number] - [place], [place] - [address]  Phone:  [phone] Fax:  [phone]     Agent: Please be advised that RX refills may take up to 3 business days. We ask that you follow-up with your pharmacy.

## 2020-04-29 ENCOUNTER — Ambulatory Visit
Admission: RE | Admit: 2020-04-29 | Discharge: 2020-04-29 | Disposition: A | Discharge status: Home / Self Care | Payer: 59 | Source: Ambulatory Visit | Attending: Surgery | Admitting: Surgery

## 2020-04-29 DIAGNOSIS — Z1231 Encounter for screening mammogram for malignant neoplasm of breast: Secondary | ICD-10-CM | POA: Insufficient documentation

## 2020-04-29 DIAGNOSIS — Z129 Encounter for screening for malignant neoplasm, site unspecified: Secondary | ICD-10-CM

## 2020-05-06 ENCOUNTER — Ambulatory Visit: Payer: Managed Care, Other (non HMO) | Admitting: Surgery

## 2020-05-13 ENCOUNTER — Other Ambulatory Visit: Payer: Self-pay | Admitting: Family Medicine

## 2020-05-13 DIAGNOSIS — K219 Gastro-esophageal reflux disease without esophagitis: Secondary | ICD-10-CM

## 2020-05-13 NOTE — Telephone Encounter (Signed)
Requested medications are due for refill today?  Yes  Requested medications are on active medication list?  Yes  Last Refill:   10/20/2019  # 90 with one refill   Future visit scheduled?  No   Notes to Clinic:  Upon attempting to refill this message appeared:  Why Am I Seeing These Alternatives?   omeprazole (PRILOSEC) 20 MG capsule [Pharmacy Med Name: OMEPRAZOLE DR 20 MG CAPSULE] is not on the preferred formulary for the patient's insurance plan. Below are alternatives which are likely to be more affordable. Do not assume that every medication presented is a clinically appropriate alternative. Please evaluate for alternative.

## 2020-05-24 ENCOUNTER — Other Ambulatory Visit: Payer: Self-pay | Admitting: Family Medicine

## 2020-05-24 DIAGNOSIS — R4182 Altered mental status, unspecified: Secondary | ICD-10-CM

## 2020-05-24 NOTE — Telephone Encounter (Signed)
Requested medications are due for refill today?  Yes - This medication refill cannot be delegated.    Requested medications are on active medication list?  Yes  Last Refill:   05/13/2019  # 30 with 6 refills  Future visit scheduled? Yes  Notes to Clinic:  This medication refill cannot be delegated.

## 2020-06-06 ENCOUNTER — Other Ambulatory Visit: Payer: Self-pay | Admitting: Family Medicine

## 2020-06-06 DIAGNOSIS — I10 Essential (primary) hypertension: Secondary | ICD-10-CM

## 2020-06-06 DIAGNOSIS — E785 Hyperlipidemia, unspecified: Secondary | ICD-10-CM

## 2020-06-06 NOTE — Telephone Encounter (Signed)
Requested  medications are  due for refill today yes  Requested medications are on the active medication list yes  Last refill 5/27  Last  visit May 2021  Notes to clinic labs were mentioned in office note from May but not done. Protocol failed due to labs are out of date.

## 2020-06-17 ENCOUNTER — Other Ambulatory Visit: Payer: Self-pay | Admitting: Family Medicine

## 2020-06-17 DIAGNOSIS — M159 Polyosteoarthritis, unspecified: Secondary | ICD-10-CM

## 2020-06-17 DIAGNOSIS — G629 Polyneuropathy, unspecified: Secondary | ICD-10-CM

## 2020-06-30 ENCOUNTER — Encounter: Payer: Self-pay | Admitting: Obstetrics and Gynecology

## 2020-06-30 ENCOUNTER — Ambulatory Visit (INDEPENDENT_AMBULATORY_CARE_PROVIDER_SITE_OTHER): Payer: 59 | Admitting: Obstetrics and Gynecology

## 2020-06-30 VITALS — BP 118/72 | Ht <= 58 in | Wt 253.4 lb

## 2020-06-30 DIAGNOSIS — F4310 Post-traumatic stress disorder, unspecified: Secondary | ICD-10-CM

## 2020-06-30 DIAGNOSIS — L0232 Furuncle of buttock: Secondary | ICD-10-CM | POA: Diagnosis not present

## 2020-06-30 NOTE — Progress Notes (Signed)
Pt present today due to has a cyst on her vaginal area x 2 weeks. Pt stated that she also has noticed dots of bleeding when she urinate and have a bowel movement.
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
## 2020-07-03 ENCOUNTER — Encounter: Payer: Self-pay | Admitting: Obstetrics and Gynecology

## 2020-09-01 ENCOUNTER — Telehealth: Payer: Self-pay | Admitting: Cardiovascular Disease

## 2020-09-01 NOTE — Telephone Encounter (Signed)
Patient calling in with concerns regarding covid vaccines. Patient states Dr. Mariah Milling has her history and medications but she has no distinct questions to pass on

## 2020-09-01 NOTE — Telephone Encounter (Signed)
Patient calling in to see which vaccine she should get based on her history and current medications. Reviewed all allergies as well and inquired if she had any anaphylactic reactions to medications and stated she had only one bad reaction to insect bite. Discussed different options for vaccination and let her know that Dr. Mariah Milling typically advises Pfizer 2 shots and if needed booster in 6-8 months. She verbalized understanding, was agreeable with review of current medications and vaccine information reviewed. She said that she will call back to update when she gets the vaccine. She had no further questions at this time.

## 2020-09-03 ENCOUNTER — Other Ambulatory Visit: Payer: Self-pay | Admitting: Family Medicine

## 2020-09-03 DIAGNOSIS — I10 Essential (primary) hypertension: Secondary | ICD-10-CM

## 2020-09-03 NOTE — Telephone Encounter (Signed)
Spoke with patient and she states that both her and son got vaccine and wanted to let us know. It was dose #1 and recommended to call us after dose #2 then we can enter that information. She verbalized understanding with no further questions at this time.

## 2020-09-03 NOTE — Telephone Encounter (Signed)
Patient calling to let us know she and son received pfizer vaccine

## 2020-09-11 ENCOUNTER — Other Ambulatory Visit: Payer: Self-pay | Admitting: Family Medicine

## 2020-09-11 DIAGNOSIS — B372 Candidiasis of skin and nail: Secondary | ICD-10-CM

## 2020-09-11 NOTE — Telephone Encounter (Signed)
Requested medication (s) are due for refill today:yes  Requested medication (s) are on the active medication list: yes  Last refill:  fluconazole 07/23/19  #4  11 refills,  nystatin  45g  2 refills  Future visit scheduled: No  Notes to clinic:  both meds off protocol    Requested Prescriptions  Pending Prescriptions Disp Refills   fluconazole (DIFLUCAN) 200 MG tablet [Pharmacy Med Name: FLUCONAZOLE 200 MG TABLET] 4 tablet 11    Sig: TAKE 1 TABLET (200 MG TOTAL) BY MOUTH ONCE A WEEK. FOR MAINTENANCE      Off-Protocol Failed - 09/11/2020  1:15 PM      Failed - Medication not assigned to a protocol, review manually.      Passed - Valid encounter within last 12 months    Recent Outpatient Visits           5 months ago PTSD (post-traumatic stress disorder)   [facility] Smitty Cords, DO   2 years ago Baker's cyst of knee, left   [facility], Netta Neat, DO   2 years ago Baker's cyst of knee, left   [facility], Netta Neat, DO   3 years ago Acute bronchitis, unspecified organism   [facility], Netta Neat, DO   3 years ago HYPERTENSION, BENIGN   [facility] Janeann Forehand., MD                nystatin (MYCOSTATIN/NYSTOP) powder [Pharmacy Med Name: NYSTATIN 100,000 UNIT/GM POWD] 45 g 2    Sig: One application twice a day to rash for up to 1-2 weeks as needed      Off-Protocol Failed - 09/11/2020  1:15 PM      Failed - Medication not assigned to a protocol, review manually.      Passed - Valid encounter within last 12 months    Recent Outpatient Visits           5 months ago PTSD (post-traumatic stress disorder)   [facility] Smitty Cords, DO   2 years ago Baker's cyst of knee, left   [facility] [place], Netta Neat, DO   2 years ago Baker's cyst of knee, left   [facility]  [place], Netta Neat, DO   3 years ago Acute bronchitis, unspecified organism   [facility], Netta Neat, DO   3 years ago HYPERTENSION, BENIGN   [facility] Janeann Forehand., MD

## 2020-10-24 ENCOUNTER — Other Ambulatory Visit: Payer: Self-pay | Admitting: Family Medicine

## 2020-10-24 DIAGNOSIS — F4310 Post-traumatic stress disorder, unspecified: Secondary | ICD-10-CM

## 2020-10-24 DIAGNOSIS — F331 Major depressive disorder, recurrent, moderate: Secondary | ICD-10-CM

## 2020-10-24 NOTE — Telephone Encounter (Signed)
Called pt and informed her she is due for an appt- pt stated she will need to call back this week to schedule appt. 30 day courtesy RF given Requested Prescriptions  Pending Prescriptions Disp Refills  . DULoxetine (CYMBALTA) 30 MG capsule [Pharmacy Med Name: DULOXETINE HCL DR 30 MG CAP] 90 capsule 0    Sig: TAKE 3 CAPSULES BY MOUTH EVERY DAY     Psychiatry: Antidepressants - SNRI Failed - 10/24/2020  9:07 AM      Failed - Completed PHQ-2 or PHQ-9 in the last 360 days      Failed - Valid encounter within last 6 months    Recent Outpatient Visits          6 months ago PTSD (post-traumatic stress disorder)   [facility]mitty Cords, DO   2 years ago Baker's cyst of knee, left   [facility] [place], Netta Neat, DO   3 years ago Baker's cyst of knee, left   [facility] [place], Netta Neat, DO   3 years ago Acute bronchitis, unspecified organism   [facility] [place], Netta Neat, DO   3 years ago HYPERTENSION, BENIGN   [facility] Janeann Forehand., MD             Passed - Last BP in normal range    BP Readings from Last 1 Encounters:  06/30/20 118/72

## 2020-11-21 ENCOUNTER — Other Ambulatory Visit: Payer: Self-pay | Admitting: Family Medicine

## 2020-11-21 DIAGNOSIS — F331 Major depressive disorder, recurrent, moderate: Secondary | ICD-10-CM

## 2020-11-21 DIAGNOSIS — F4310 Post-traumatic stress disorder, unspecified: Secondary | ICD-10-CM

## 2020-11-21 NOTE — Telephone Encounter (Signed)
Requested medication (s) are due for refill today: yes  Requested medication (s) are on the active medication list: yes  Last refill:  10/24/20 30 day courtesy refill  Future visit scheduled: no  Notes to clinic:  called pt and LM on VM to call office this week to schedule appt   Requested Prescriptions  Pending Prescriptions Disp Refills   DULoxetine (CYMBALTA) 30 MG capsule [Pharmacy Med Name: DULOXETINE HCL DR 30 MG CAP] 90 capsule 0    Sig: TAKE 3 CAPSULES BY MOUTH EVERY DAY      Psychiatry: Antidepressants - SNRI Failed - 11/21/2020  8:58 AM      Failed - Completed PHQ-2 or PHQ-9 in the last 360 days      Failed - Valid encounter within last 6 months    Recent Outpatient Visits           7 months ago PTSD (post-traumatic stress disorder)   [facility] Smitty Cords, DO   2 years ago Baker's cyst of knee, left   [facility] [place], Netta Neat, DO   3 years ago Baker's cyst of knee, left   [facility] [place], Netta Neat, DO   3 years ago Acute bronchitis, unspecified organism   [facility] [place], Netta Neat, DO   3 years ago HYPERTENSION, BENIGN   [facility] Janeann Forehand., MD                Passed - Last BP in normal range    BP Readings from Last 1 Encounters:  06/30/20 118/72

## 2020-11-22 ENCOUNTER — Other Ambulatory Visit: Payer: Self-pay | Admitting: Family Medicine

## 2020-11-22 DIAGNOSIS — K219 Gastro-esophageal reflux disease without esophagitis: Secondary | ICD-10-CM

## 2020-11-22 NOTE — Telephone Encounter (Signed)
Requested Prescriptions  Pending Prescriptions Disp Refills  . omeprazole (PRILOSEC) 20 MG capsule [Pharmacy Med Name: OMEPRAZOLE DR 20 MG CAPSULE] 90 capsule 1    Sig: TAKE 1 CAPSULE BY MOUTH EVERY DAY     Gastroenterology: Proton Pump Inhibitors Passed - 11/22/2020  9:21 AM      Passed - Valid encounter within last 12 months    Recent Outpatient Visits          7 months ago PTSD (post-traumatic stress disorder)   [facility] Smitty Cords, DO   2 years ago Baker's cyst of knee, left   [facility] [place], Netta Neat, DO   3 years ago Baker's cyst of knee, left   [facility] [place], Netta Neat, DO   3 years ago Acute bronchitis, unspecified organism   [facility] [place], Netta Neat, DO   3 years ago HYPERTENSION, BENIGN   [facility] Janeann Forehand., MD

## 2020-12-11 ENCOUNTER — Other Ambulatory Visit: Payer: Self-pay | Admitting: Family Medicine

## 2020-12-11 DIAGNOSIS — I10 Essential (primary) hypertension: Secondary | ICD-10-CM

## 2020-12-25 ENCOUNTER — Other Ambulatory Visit: Payer: Self-pay | Admitting: Family Medicine

## 2020-12-25 ENCOUNTER — Other Ambulatory Visit: Payer: Self-pay

## 2020-12-25 ENCOUNTER — Telehealth: Payer: Self-pay | Admitting: Cardiovascular Disease

## 2020-12-25 DIAGNOSIS — F331 Major depressive disorder, recurrent, moderate: Secondary | ICD-10-CM

## 2020-12-25 DIAGNOSIS — I10 Essential (primary) hypertension: Secondary | ICD-10-CM

## 2020-12-25 DIAGNOSIS — E785 Hyperlipidemia, unspecified: Secondary | ICD-10-CM

## 2020-12-25 DIAGNOSIS — F4310 Post-traumatic stress disorder, unspecified: Secondary | ICD-10-CM

## 2020-12-25 MED ORDER — NEBIVOLOL HCL 10 MG PO TABS: 10.0000 mg | ORAL_TABLET | Freq: Every day | ORAL | 0 refills | Status: DC

## 2020-12-25 NOTE — Telephone Encounter (Signed)
Please schedule F/U appointment for refills or have patient get medication through PCP who last prescribed it. Thank you! Last seen 09/2019

## 2020-12-25 NOTE — Telephone Encounter (Signed)
Spoke with pt. She states Dr. Mariah Milling prescribed this medication to her a long time ago after her heart attack. She has been trying to get her PCP to fill the medication and she, along with the pharmacy, has not heard from him. She states she will be looking for a new PCP. Will send 15 tab w/ no refills per Herbert Seta, RN to get pt to her appt with Dr. Mariah Milling on Tuesday.

## 2020-12-25 NOTE — Telephone Encounter (Signed)
*  STAT* If patient is at the pharmacy, call can be transferred to refill team.   1. Which medications need to be refilled? (please list name of each medication and dose if known) Bystolic  2. Which pharmacy/location (including street and city if local pharmacy) is medication to be sent to? [facility]  3. Do they need a 30 day or 90 day supply? 90 Patient states she has been out for a week

## 2020-12-25 NOTE — Telephone Encounter (Signed)
°*  STAT* If patient is at the pharmacy, call can be transferred to refill team.   1. Which medications need to be refilled? (please list name of each medication and dose if known)   Bystolic 10 mg po q d NAME BRAND PER PATIENT  2. Which pharmacy/location (including street and city if local pharmacy) is medication to be sent to?  [facility]   3. Do they need a 30 day or 90 day supply? 90  PATIENT IS OUT

## 2020-12-25 NOTE — Telephone Encounter (Signed)
Requested medications are due for refill today yes  Requested medications are on the active medication list yes  Last visit 04/01/20  Future visit scheduled no  Notes to clinic failed protocol of labs within 360 days, visit within 12 months for lipid, 6 month for other two, no upcoming visit scheduled.

## 2020-12-25 NOTE — Telephone Encounter (Signed)
Pt has been scheduled for future appointment. Pt requesting refill on her bystolic 10 mg qd. Pt's medication last filled by PCP and requiring pt to schedule appointment with PCP as well. Please advise if ok to refill medication.

## 2020-12-25 NOTE — Telephone Encounter (Signed)
LVM to schedule f/u

## 2020-12-25 NOTE — Telephone Encounter (Signed)
Scheduled for Tuesday with Gollan. Patient needs medication ASAP as she is out and feeling awful

## 2020-12-25 NOTE — Telephone Encounter (Signed)
OK to refill bystolic until the patient is seen in the office. # 15 tablets w/ no refills is ok to get her through the weekend.

## 2020-12-25 NOTE — Telephone Encounter (Signed)
Patient calling to check on status.

## 2020-12-25 NOTE — Telephone Encounter (Signed)
See telephone note from today (12/25/20) in pt chart. Pt needs f/u appt. Has already been routed to scheduling.

## 2020-12-25 NOTE — Telephone Encounter (Signed)
Bystolic is prescribed by PCP, Dr. Malachi Paradise. Med will need to be filled by his office. LMTCB--no DPR in chart.

## 2020-12-29 ENCOUNTER — Other Ambulatory Visit: Payer: Self-pay

## 2020-12-29 ENCOUNTER — Telehealth (INDEPENDENT_AMBULATORY_CARE_PROVIDER_SITE_OTHER): Payer: 59 | Admitting: Cardiovascular Disease

## 2020-12-29 ENCOUNTER — Ambulatory Visit: Payer: 59 | Admitting: Cardiovascular Disease

## 2020-12-29 ENCOUNTER — Encounter: Payer: Self-pay | Admitting: Cardiovascular Disease

## 2020-12-29 VITALS — Ht <= 58 in

## 2020-12-29 DIAGNOSIS — F332 Major depressive disorder, recurrent severe without psychotic features: Secondary | ICD-10-CM | POA: Diagnosis not present

## 2020-12-29 DIAGNOSIS — R0789 Other chest pain: Secondary | ICD-10-CM | POA: Diagnosis not present

## 2020-12-29 DIAGNOSIS — F4310 Post-traumatic stress disorder, unspecified: Secondary | ICD-10-CM | POA: Diagnosis not present

## 2020-12-29 DIAGNOSIS — F419 Anxiety disorder, unspecified: Secondary | ICD-10-CM

## 2020-12-29 DIAGNOSIS — E782 Mixed hyperlipidemia: Secondary | ICD-10-CM

## 2020-12-29 DIAGNOSIS — I10 Essential (primary) hypertension: Secondary | ICD-10-CM

## 2020-12-29 MED ORDER — VERAPAMIL HCL ER 240 MG PO CP24: 240.0000 mg | ORAL_CAPSULE | Freq: Every day | ORAL | 3 refills | Status: DC

## 2020-12-29 MED ORDER — NITROGLYCERIN 0.4 MG SL SUBL: 0.4000 mg | SUBLINGUAL_TABLET | SUBLINGUAL | 3 refills | Status: AC | PRN

## 2020-12-29 MED ORDER — NEBIVOLOL HCL 10 MG PO TABS: 10.0000 mg | ORAL_TABLET | Freq: Every day | ORAL | 3 refills | Status: DC

## 2020-12-29 NOTE — Progress Notes (Signed)
Virtual Visit via Video Note   This visit type was conducted due to national recommendations for restrictions regarding the COVID-19 Pandemic (e.g. social distancing) in an effort to limit this patient's exposure and mitigate transmission in our community.  Due to her co-morbid illnesses, this patient is at least at moderate risk for complications without adequate follow up.  This format is felt to be most appropriate for this patient at this time.  All issues noted in this document were discussed and addressed.  A limited physical exam was performed with this format.  Please refer to the patient's chart for her consent to telehealth for [facility].  I connected with  Elizabeth NormanPamela F Wyatt on 12/29/20 by a video enabled telemedicine application and verified that I am speaking with the correct person using two identifiers. I am contacting the patient above from our cardiology clinic office or alternate office work station to their home, I discussed the limitations of evaluation and management by telemedicine. The patient expressed understanding and agreed to proceed.   Evaluation Performed:  Follow-up visit  Date:  12/29/2020   ID:  Elizabeth Wyatt, DOB 03-19-1956, MRN [id number]  Patient Location:  [address] [place] [id number]   Provider location:   [facility], [place] office  PCP:  Smitty CordsKaramalegos, Alexander J, DO  Cardiologist:  Hubbard RobinsonGollan, [facility]   Chief Complaint  Patient presents with  . Other    Past due follow up - patient states she only has CP when she is stressed. She thinks that is Normal. Meds reviewed verbally with patient.     History of Present Illness:    Elizabeth Wyatt is a 65 y.o. female who presents via audio/video conferencing for a telehealth visit today.   The patient does not symptoms concerning for COVID-19 infection (fever, chills, cough, or new SHORTNESS OF BREATH).   Patient has a past medical history of morbid obesity,   hypertension,  hyperlipidemia,  chronic chest pain  cardiac catheterization May 2009   no significant coronary artery disease (circumflex coming off the ostium of the RCA)  significant stress at home, son who has autism. Separated from her husband Who presents for routine followup of her chest pain symptoms.   Stress at home, PTSD issues, Weight back up Poor housing, falling apart, floor falling apart  Chest tight with stress, panic attacks Son , Reuel Boomdaniel, he is autistic, acting out ex husband using cocaine",  legs  Weak, no exercise  No recent labs Previous visits with dizziness, nausea, diarrhea  Other past medical history reviewed Previous episode of paralysis, aphasia approximately one month ago. Woke up, could not move, could not open her eyes, could not speak. No focal deficits. She reports that her per minute was working but she could not communicate. After several hours was able to call for help  Reports having similar symptoms many years ago and was kept in the hospital at that time. 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   CT scan of the head showed no pathology She is scheduled to have carotid ultrasound, possibly MRI, follow-up with neurology  Stress test 04/2008:  Cardiac catheter at the same time   Past Medical History:  Diagnosis Date  . Allergy   . Asthma   . Broken rib   . CAD (coronary artery disease)   . Family history of malignant neoplasm of breast   . HLD (hyperlipidemia)   .  Obesity   . Osteoarthritis   . Personal history of colonic polyps   . Personal history of tobacco use, presenting hazards to health   . Special screening for malignant neoplasms, colon   . TIA (transient ischemic attack)    Past Surgical History:  Procedure Laterality Date  . BREAST BIOPSY Right 2009  . BREAST BIOPSY Right 04/2018   ductal hyperplasia, cystic and papillary metaplasia, microscopic intraducal  papilloma, sclerosing adenosis  . BREAST MASS EXCISION Right 2009  . CARDIAC CATHETERIZATION  2010   Dr. Mariah Milling with [facility]  . COLONOSCOPY  2008, 2014   [facility]  . DILATION AND CURETTAGE OF UTERUS  2012  . MOUTH SURGERY    . MOUTH SURGERY    . UTERINE FIBROID SURGERY  2012      Allergies:   Diphenhydramine hcl, Guaifenesin, Latex, Montelukast sodium, Pamabrom, Simvastatin, Sulfonamide derivatives, and Wellbutrin [bupropion]   Social History   Tobacco Use  . Smoking status: Former Smoker    Packs/day: 3.00    Years: 3.00    Pack years: 9.00    Types: Cigarettes    Quit date: 1993    Years since quitting: 29.0  . Smokeless tobacco: Former Neurosurgeon  . Tobacco comment: tobacco use - no   Vaping Use  . Vaping Use: Never used  Substance Use Topics  . Alcohol use: No    Comment: quit drinking years ago, used to drink on weekends  . Drug use: No     Current Outpatient Medications on File Prior to Visit  Medication Sig Dispense Refill  . acetaminophen (TYLENOL) 500 MG tablet Take 500 mg by mouth every 6 (six) hours as needed.    Marland Kitchen albuterol (PROAIR HFA) 108 (90 Base) MCG/ACT inhaler Inhale 2 puffs into the lungs every 4 (four) hours as needed for wheezing or shortness of breath. 8.5 Inhaler 3  . aspirin EC 81 MG tablet Take 1 tablet (81 mg total) by mouth daily.    Marland Kitchen atorvastatin (LIPITOR) 10 MG tablet TAKE 1 TABLET BY MOUTH EVERY DAY 90 tablet 1  . cetirizine (ZYRTEC) 10 MG tablet Take 10 mg by mouth as needed.    . diclofenac Sodium (VOLTAREN) 1 % GEL Apply 2 g topically 3 (three) times daily as needed. 100 g 2  . DULoxetine (CYMBALTA) 30 MG capsule TAKE 3 CAPSULES BY MOUTH EVERY DAY 90 capsule 0  . fluconazole (DIFLUCAN) 200 MG tablet TAKE 1 TABLET (200 MG TOTAL) BY MOUTH ONCE A WEEK. FOR MAINTENANCE 4 tablet 11  . fluticasone (FLOVENT HFA) 110 MCG/ACT inhaler Inhale 2 puffs into the lungs 2 (two) times a day. 12 g 5  . gabapentin (NEURONTIN) 100 MG capsule START 1 CAPSULE DAILY,  INCREASE BY 1 CAP EVERY 2-3 DAYS AS TOLERATED UP TO 3 TIMES A DAY, OR MAY TAKE 3 AT ONCE IN [place]. 270 capsule 2  . hydrOXYzine (ATARAX/VISTARIL) 25 MG tablet Take 1 tablet (25 mg total) by mouth every 8 (eight) hours as needed for anxiety. 30 tablet 2  . levothyroxine (SYNTHROID) 50 MCG tablet TAKE 1 TABLET BY MOUTH EVERY DAY 30 tablet 12  . meclizine (ANTIVERT) 25 MG tablet Take 1 tablet (25 mg total) by mouth 3 (three) times daily as needed. 60 tablet 1  . Naproxen Sodium (ALEVE PO) Take by mouth.    . nebivolol (BYSTOLIC) 10 MG tablet Take 1 tablet (10 mg total) by mouth daily. Please keep your upcoming appointment with Dr. Mariah Milling for refills. 15 tablet 0  .  nitroGLYCERIN (NITROSTAT) 0.4 MG SL tablet Place 0.4 mg under the tongue every 5 (five) minutes as needed.    Marland Kitchen omeprazole (PRILOSEC) 20 MG capsule TAKE 1 CAPSULE BY MOUTH EVERY DAY 90 capsule 1  . topiramate (TOPAMAX) 50 MG tablet TAKE 1/2 TABLET BY MOUTH DAILY AS DIRECTED 15 tablet 12  . triamcinolone cream (KENALOG) 0.1 % Apply 1 application topically daily as needed.  1  . verapamil (VERELAN PM) 240 MG 24 hr capsule TAKE 1 CAPSULE BY MOUTH AT BEDTIME 90 capsule 1  . nystatin (MYCOSTATIN/NYSTOP) powder ONE APPLICATION TWICE A DAY TO RASH FOR UP TO 1-2 WEEKS AS NEEDED 45 g 2   No current facility-administered medications on file prior to visit.     Family Hx: The patient's family history includes Breast cancer (age of onset: 51) in her mother; Cancer in her maternal grandmother and mother.  ROS:   Please see the history of present illness.    Review of Systems  Constitutional: Negative.   HENT: Negative.   Respiratory: Negative.   Cardiovascular: Negative.   Gastrointestinal: Negative.   Musculoskeletal: Negative.   Neurological: Negative.   Psychiatric/Behavioral: The patient is nervous/anxious.        Stressed  All other systems reviewed and are negative.    Labs/Other Tests and Data Reviewed:    Recent Labs: No  results found for requested labs within last 8760 hours.   Recent Lipid Panel Lab Results  Component Value Date/Time   CHOL 164 04/15/2016 12:00 PM   TRIG 128 04/15/2016 12:00 PM   HDL 94 04/15/2016 12:00 PM   CHOLHDL 1.7 04/15/2016 12:00 PM   CHOLHDL 3 10/24/2013 08:56 AM   LDLCALC 44 04/15/2016 12:00 PM   LDLDIRECT 123.0 10/24/2013 08:56 AM    Wt Readings from Last 3 Encounters:  06/30/20 253 lb 6.4 oz (114.9 kg)  04/07/20 256 lb 9.6 oz (116.4 kg)  03/25/20 260 lb 6.4 oz (118.1 kg)     Exam:    Vital Signs: Vital signs may also be detailed in the HPI Ht 4\' 8"  (1.422 m)   BMI 56.81 kg/m   Wt Readings from Last 3 Encounters:  06/30/20 253 lb 6.4 oz (114.9 kg)  04/07/20 256 lb 9.6 oz (116.4 kg)  03/25/20 260 lb 6.4 oz (118.1 kg)   Temp Readings from Last 3 Encounters:  04/07/20 (!) 97.3 F (36.3 C) (Temporal)  10/02/19 97.9 F (36.6 C)  05/07/19 97.7 F (36.5 C) (Temporal)   BP Readings from Last 3 Encounters:  06/30/20 118/72  04/07/20 116/70  03/25/20 116/70   Pulse Readings from Last 3 Encounters:  04/07/20 76  03/25/20 92  10/02/19 72     Well nourished, well developed female in no acute distress. Constitutional:  oriented to person, place, and time. No distress.  Psychiatric:  normal mood and affect. behavior is normal. Thought content normal.   ASSESSMENT & PLAN:    Problem List Items Addressed This Visit      Cardiology Problems   Hyperlipidemia   Hypertension     Other   Anxiety   Atypical chest pain - Primary   Major depressive disorder, recurrent, severe without psychotic features (HCC)   PTSD (post-traumatic stress disorder)     transient cerebral ischemias - aspirin 81 mg daily No recent epsiodes  HYPERTENSION, BENIGN -  Blood pressure is well controlled on today's visit. No changes made to the medications.  Hyperlipidemia No recent labs available  Atypical chest pain Associated with  stress Management of stress   Morbid  obesity due to excess calories (HCC) Eating more secondary to stress We have encouraged continued exercise, careful diet management in an effort to lose weight.  ADJ DISORDER WITH MIXED ANXIETY & DEPRESSED MOOD continued  stress at home,  separated from her husband, disabled child House needs work, money issues, son is difficult   COVID-19 Education: The signs and symptoms of COVID-19 were discussed with the patient and how to seek care for testing (follow up with PCP or arrange E-visit).  The importance of social distancing was discussed today.  Patient Risk:   After full review of this patients clinical status, I feel that they are at least moderate risk at this time.  Time:   Today, I have spent 25 minutes with the patient with telehealth technology discussing the cardiac and medical problems/diagnoses detailed above   Additional 10 min spent reviewing the chart prior to patient visit today   Medication Adjustments/Labs and Tests Ordered: Current medicines are reviewed at length with the patient today.  Concerns regarding medicines are outlined above.   Tests Ordered: No tests ordered   Medication Changes: No changes made   Signed, Julien Nordmann, MD  [facility] [address], [place], [place] [address]

## 2020-12-29 NOTE — Patient Instructions (Addendum)
[facility]  [phone]   This office has Dr. Nemiah Commander as primary care provider  Medication Instructions:  All your cardiac medications have been updated for 90 day supply for one year, please call your pharmacy when refills are needed, they may be picked up today if needed.  Lab work: No new labs needed  Testing/Procedures: No new testing needed   Follow-Up: At [facility], you and your health needs are our priority.  As part of our continuing mission to provide you with exceptional heart care, we have created designated Provider Care Teams.  These Care Teams include your primary Cardiologist (physician) and Advanced Practice Providers (APPs -  Physician Assistants and Nurse Practitioners) who all work together to provide you with the care you need, when you need it.  . You will need a follow up appointment as needed  . Providers on your designated Care Team:   . Nicolasa Ducking, NP . Eula Listen, PA-C . Marisue Ivan, PA-C  Any Other Special Instructions Will Be Listed Below (If Applicable).  COVID-19 Vaccine Information can be found at: PodExchange.nl For questions related to vaccine distribution or appointments, please email [email or web address] or call 4808550187.

## 2021-01-25 ENCOUNTER — Ambulatory Visit: Payer: 59 | Admitting: Cardiovascular Disease

## 2021-02-01 ENCOUNTER — Other Ambulatory Visit: Payer: Self-pay | Admitting: Family Medicine

## 2021-02-01 DIAGNOSIS — F4310 Post-traumatic stress disorder, unspecified: Secondary | ICD-10-CM

## 2021-02-01 DIAGNOSIS — F331 Major depressive disorder, recurrent, moderate: Secondary | ICD-10-CM

## 2021-02-09 ENCOUNTER — Ambulatory Visit (INDEPENDENT_AMBULATORY_CARE_PROVIDER_SITE_OTHER): Payer: 59 | Admitting: Obstetrics and Gynecology

## 2021-02-09 ENCOUNTER — Encounter: Payer: Self-pay | Admitting: Obstetrics and Gynecology

## 2021-02-09 ENCOUNTER — Other Ambulatory Visit: Payer: Self-pay

## 2021-02-09 VITALS — BP 122/78 | Ht <= 58 in | Wt 262.9 lb

## 2021-02-09 DIAGNOSIS — R6889 Other general symptoms and signs: Secondary | ICD-10-CM | POA: Diagnosis not present

## 2021-02-09 DIAGNOSIS — F331 Major depressive disorder, recurrent, moderate: Secondary | ICD-10-CM | POA: Diagnosis not present

## 2021-02-09 DIAGNOSIS — B372 Candidiasis of skin and nail: Secondary | ICD-10-CM | POA: Diagnosis not present

## 2021-02-09 DIAGNOSIS — L659 Nonscarring hair loss, unspecified: Secondary | ICD-10-CM

## 2021-02-09 DIAGNOSIS — T148XXD Other injury of unspecified body region, subsequent encounter: Secondary | ICD-10-CM

## 2021-02-09 DIAGNOSIS — Z6841 Body Mass Index (BMI) 40.0 and over, adult: Secondary | ICD-10-CM

## 2021-02-09 NOTE — Progress Notes (Signed)
Pt present due to yeast on several areas of the body. Pt stated having areas under her breast, folds of legs and underarms.
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
## 2021-02-10 LAB — HEMOGLOBIN A1C
Est. average glucose Bld gHb Est-mCnc: 134 mg/dL
Hgb A1c MFr Bld: 6.3 % — ABNORMAL HIGH (ref 4.8–5.6)

## 2021-02-10 NOTE — Patient Instructions (Signed)
Preventing Unhealthy Weight Gain, Adult Staying at a healthy weight is important to your overall health. When fat builds up in your body, you may become overweight or obese. Being overweight or obese increases your risk of developing certain health problems, such as heart disease, diabetes, sleeping problems, joint problems, and some types of cancer. Unhealthy weight gain is often the result of making unhealthy food choices or not getting enough exercise. You can make changes to your lifestyle to prevent obesity and stay as healthy as possible. What nutrition changes can be made?  Eat only as much as your body needs. To do this: ? Pay attention to signs that you are hungry or full. Stop eating as soon as you feel full. ? If you feel hungry, try drinking water first before eating. Drink enough water so your urine is clear or pale yellow. ? Eat smaller portions. Pay attention to portion sizes when eating out. ? Look at serving sizes on food labels. Most foods contain more than one serving per container. ? Eat the recommended number of calories for your gender and activity level. For most active people, a daily total of 2,000 calories is appropriate. If you are trying to lose weight or are not very active, you may need to eat fewer calories. Talk with your health care provider or a diet and nutrition specialist (dietitian) about how many calories you need each day.  Choose healthy foods, such as: ? Fruits and vegetables. At each meal, try to fill at least half of your plate with fruits and vegetables. ? Whole grains, such as whole-wheat bread, brown rice, and quinoa. ? Lean meats, such as chicken or fish. ? Other healthy proteins, such as beans, eggs, or tofu. ? Healthy fats, such as nuts, seeds, fatty fish, and olive oil. ? Low-fat or fat-free dairy products.  Check food labels, and avoid food and drinks that: ? Are high in calories. ? Have added sugar. ? Are high in sodium. ? Have saturated  fats or trans fats.  Cook foods in healthier ways, such as by baking, broiling, or grilling.  Make a meal plan for the week, and shop with a grocery list to help you stay on track with your purchases. Try to avoid going to the grocery store when you are hungry.  When grocery shopping, try to shop around the outside of the store first, where the fresh foods are. Doing this helps you to avoid prepackaged foods, which can be high in sugar, salt (sodium), and fat.   What lifestyle changes can be made?  Exercise for 30 or more minutes on 5 or more days each week. Exercising may include brisk walking, yard work, biking, running, swimming, and team sports like basketball and soccer. Ask your health care provider which exercises are safe for you.  Do muscle-strengthening activities, such as lifting weights or using resistance bands, on 2 or more days a week.  Do not use any products that contain nicotine or tobacco, such as cigarettes and e-cigarettes. If you need help quitting, ask your health care provider.  Limit alcohol intake to no more than 1 drink a day for nonpregnant women and 2 drinks a day for men. One drink equals 12 oz of beer, 5 oz of wine, or 1 oz of hard liquor.  Try to get 7-9 hours of sleep each night.   What other changes can be made?  Keep a food and activity journal to keep track of: ? What you ate and how   many calories you had. Remember to count the calories in sauces, dressings, and side dishes. ? Whether you were active, and what exercises you did. ? Your calorie, weight, and activity goals.  Check your weight regularly. Track any changes. If you notice you have gained weight, make changes to your diet or activity routine.  Avoid taking weight-loss medicines or supplements. Talk to your health care provider before starting any new medicine or supplement.  Talk to your health care provider before trying any new diet or exercise plan. Why are these changes  important? Eating healthy, staying active, and having healthy habits can help you to prevent obesity. Those changes also:  Help you manage stress and emotions.  Help you connect with friends and family.  Improve your self-esteem.  Improve your sleep.  Prevent long-term health problems. What can happen if changes are not made? Being obese or overweight can cause you to develop joint or bone problems, which can make it hard for you to stay active or do activities you enjoy. Being obese or overweight also puts stress on your heart and lungs and can lead to health problems like diabetes, heart disease, and some cancers. Where to find more information Talk with your health care provider or a dietitian about healthy eating and healthy lifestyle choices. You may also find information from:  U.[place]. Department of Agriculture, MyPlate: www.choosemyplate.gov  American Heart Association: www.heart.org  Centers for Disease Control and Prevention: www.cdc.gov Summary  Staying at a healthy weight is important to your overall health. It helps you to prevent certain diseases and health problems, such as heart disease, diabetes, joint problems, sleep disorders, and some types of cancer.  Being obese or overweight can cause you to develop joint or bone problems, which can make it hard for you to stay active or do activities you enjoy.  You can prevent unhealthy weight gain by eating a healthy diet, exercising regularly, not smoking, limiting alcohol, and getting enough sleep.  Talk with your health care provider or a dietitian for guidance about healthy eating and healthy lifestyle choices. This information is not intended to replace advice given to you by your health care provider. Make sure you discuss any questions you have with your health care provider. Document Revised: 03/26/2020 Document Reviewed: 03/26/2020 Elsevier Patient Education  2021 Elsevier Inc.  

## 2021-02-11 ENCOUNTER — Other Ambulatory Visit: Payer: Self-pay | Admitting: General Surgery

## 2021-02-11 DIAGNOSIS — Z129 Encounter for screening for malignant neoplasm, site unspecified: Secondary | ICD-10-CM

## 2021-02-16 ENCOUNTER — Telehealth: Payer: Self-pay | Admitting: Obstetrics and Gynecology

## 2021-02-16 NOTE — Telephone Encounter (Signed)
New message     Seen on last week 02/09/21 questions for the nurse asking for a call back to discuss.

## 2021-02-17 ENCOUNTER — Telehealth: Payer: Self-pay | Admitting: Obstetrics and Gynecology

## 2021-02-17 NOTE — Telephone Encounter (Signed)
Pt states she was suppose to get a packet in mail-  Has not received-. Pt unable to get access mychart requesting call back to address her questions, I asked her to allow 24-72 hours for call back and that you are actively in clinic. Please Advise.

## 2021-02-19 ENCOUNTER — Other Ambulatory Visit: Payer: Self-pay

## 2021-02-19 DIAGNOSIS — Z7689 Persons encountering health services in other specified circumstances: Secondary | ICD-10-CM

## 2021-02-19 NOTE — Telephone Encounter (Signed)
Spoke to pt and informed her of the information concerning her labs. Pt was informed that the informing that she was to get from [facility] was mailed to her on the 9th. Pt kept saying that [facility] was talking to her about a referral, information that was sent to her via mychart. Pt was informed that I did not see any of that information and to wait until the information arrive to her home to see what information is missing then we could see what questions she needed answered. Pt continued to talk for about 15 minutes about the information that [facility] had told her during her visit. Pt was informed several times that she will need to wait until the information arrived because she kept asking me to tell her what Dr. Valentino Saxon wrote in her note and kept saying that I didn't see anything in Dr. Valentino Saxon note about what she(pt) was talking about.

## 2021-03-06 ENCOUNTER — Other Ambulatory Visit: Payer: Self-pay | Admitting: Family Medicine

## 2021-03-06 DIAGNOSIS — F4310 Post-traumatic stress disorder, unspecified: Secondary | ICD-10-CM

## 2021-03-06 DIAGNOSIS — F331 Major depressive disorder, recurrent, moderate: Secondary | ICD-10-CM

## 2021-03-06 NOTE — Telephone Encounter (Signed)
Requested medication (s) are due for refill today   Yes  Requested medication (s) are on the active medication list   Yes  Future visit scheduled No  Note to clinic-PHQ-2, PHQ-9 does not meet protocol protocol. Chart note on 02/19/21 indicates patient is establishing with other PCP.   Requested Prescriptions  Pending Prescriptions Disp Refills   DULoxetine (CYMBALTA) 30 MG capsule [Pharmacy Med Name: DULOXETINE HCL DR 30 MG CAP] 30 capsule 0    Sig: TAKE 3 CAPSULES BY MOUTH EVERY DAY      Psychiatry: Antidepressants - SNRI Failed - 03/06/2021 10:13 AM      Failed - Completed PHQ-2 or PHQ-9 in the last 360 days      Failed - Valid encounter within last 6 months    Recent Outpatient Visits           11 months ago PTSD (post-traumatic stress disorder)   [facility] Smitty Cords, DO   3 years ago Baker's cyst of knee, left   [facility] [place], Netta Neat, DO   3 years ago Baker's cyst of knee, left   [facility] [place], Netta Neat, DO   3 years ago Acute bronchitis, unspecified organism   [facility] [place], Netta Neat, DO   4 years ago HYPERTENSION, BENIGN   [facility] Janeann Forehand., MD                Passed - Last BP in normal range    BP Readings from Last 1 Encounters:  02/09/21 122/78

## 2021-03-12 ENCOUNTER — Other Ambulatory Visit: Payer: Self-pay | Admitting: Family Medicine

## 2021-03-12 DIAGNOSIS — E034 Atrophy of thyroid (acquired): Secondary | ICD-10-CM

## 2021-03-12 NOTE — Telephone Encounter (Signed)
Requested medications are due for refill today yes  Requested medications are on the active medication list yes  Last refill 02/12/21  Last visit 03/2020  Future visit scheduled no  Notes to clinic Last valid labs were 2018, please assess.

## 2021-04-04 ENCOUNTER — Other Ambulatory Visit: Payer: Self-pay | Admitting: Family Medicine

## 2021-04-04 DIAGNOSIS — F331 Major depressive disorder, recurrent, moderate: Secondary | ICD-10-CM

## 2021-04-04 DIAGNOSIS — F4310 Post-traumatic stress disorder, unspecified: Secondary | ICD-10-CM

## 2021-04-15 ENCOUNTER — Other Ambulatory Visit: Payer: Self-pay | Admitting: Family Medicine

## 2021-04-15 DIAGNOSIS — F331 Major depressive disorder, recurrent, moderate: Secondary | ICD-10-CM

## 2021-04-15 DIAGNOSIS — F4310 Post-traumatic stress disorder, unspecified: Secondary | ICD-10-CM

## 2021-04-15 NOTE — Telephone Encounter (Signed)
Requested medication (s) are due for refill today: yes  Requested medication (s) are on the active medication list: yes  Last refill: 03/08/2021  Future visit scheduled: no  Notes to clinic:  overdue for follow up appointment    Requested Prescriptions  Pending Prescriptions Disp Refills   DULoxetine (CYMBALTA) 30 MG capsule [Pharmacy Med Name: DULOXETINE HCL DR 30 MG CAP] 90 capsule 0    Sig: TAKE 3 CAPSULES BY MOUTH EVERY DAY      Psychiatry: Antidepressants - SNRI Failed - 04/15/2021  2:29 PM      Failed - Completed PHQ-2 or PHQ-9 in the last 360 days      Failed - Valid encounter within last 6 months    Recent Outpatient Visits           1 year ago PTSD (post-traumatic stress disorder)   [facility] Smitty Cords, DO   3 years ago Baker's cyst of knee, left   [facility] [place], Netta Neat, DO   3 years ago Baker's cyst of knee, left   [facility] [place], Netta Neat, DO   3 years ago Acute bronchitis, unspecified organism   [facility] [place], Netta Neat, DO   4 years ago HYPERTENSION, BENIGN   [facility] Janeann Forehand., MD                Passed - Last BP in normal range    BP Readings from Last 1 Encounters:  02/09/21 122/78

## 2021-04-17 ENCOUNTER — Other Ambulatory Visit: Payer: Self-pay | Admitting: Family Medicine

## 2021-04-17 DIAGNOSIS — M8949 Other hypertrophic osteoarthropathy, multiple sites: Secondary | ICD-10-CM

## 2021-04-17 DIAGNOSIS — G629 Polyneuropathy, unspecified: Secondary | ICD-10-CM

## 2021-04-17 DIAGNOSIS — M159 Polyosteoarthritis, unspecified: Secondary | ICD-10-CM

## 2021-04-17 DIAGNOSIS — E785 Hyperlipidemia, unspecified: Secondary | ICD-10-CM

## 2021-04-17 NOTE — Telephone Encounter (Signed)
Pt is overdue for office visit. Atorvastatin 12/29/20 (overdue lab work) and Gabapentin 06/17/20 #270 2 RF.  Sent pt MyChart message to calll office and schedule appt/lab work. Requested Prescriptions  Pending Prescriptions Disp Refills  . atorvastatin (LIPITOR) 10 MG tablet [Pharmacy Med Name: ATORVASTATIN 10 MG TABLET] 30 tablet 2    Sig: TAKE 1 TABLET BY MOUTH EVERY DAY     Cardiovascular:  Antilipid - Statins Failed - 04/17/2021  9:11 AM      Failed - Total Cholesterol in normal range and within 360 days    Cholesterol, Total  Date Value Ref Range Status  04/15/2016 164 100 - 199 mg/dL Final         Failed - LDL in normal range and within 360 days    LDL Calculated  Date Value Ref Range Status  04/15/2016 44 0 - 99 mg/dL Final   Direct LDL  Date Value Ref Range Status  10/24/2013 123.0 mg/dL Final    Comment:    Optimal:  <100 mg/dLNear or Above Optimal:  100-129 mg/dLBorderline High:  130-159 mg/dLHigh:  160-189 mg/dLVery High:  >190 mg/dL         Failed - HDL in normal range and within 360 days    HDL  Date Value Ref Range Status  04/15/2016 94 >39 mg/dL Final         Failed - Triglycerides in normal range and within 360 days    Triglycerides  Date Value Ref Range Status  04/15/2016 128 0 - 149 mg/dL Final         Failed - Valid encounter within last 12 months    Recent Outpatient Visits          1 year ago PTSD (post-traumatic stress disorder)   [facility] Smitty Cords, DO   3 years ago Baker's cyst of knee, left   [facility] [place], Netta Neat, DO   3 years ago Baker's cyst of knee, left   [facility] [place], Netta Neat, DO   3 years ago Acute bronchitis, unspecified organism   [facility] [place], Netta Neat, DO   4 years ago HYPERTENSION, BENIGN   [facility] Juanetta [place], Teresita Madura., MD             Passed - Patient is not pregnant      .  gabapentin (NEURONTIN) 100 MG capsule [Pharmacy Med Name: GABAPENTIN 100 MG CAPSULE] 270 capsule 2    Sig: START 1 CAPSULE DAILY, INCREASE BY 1 CAP EVERY 2-3 DAYS AS TOLERATED UP TO 3 TIMES A DAY, OR MAY TAKE 3 AT ONCE IN [place].     Neurology: Anticonvulsants - gabapentin Failed - 04/17/2021  9:11 AM      Failed - Valid encounter within last 12 months    Recent Outpatient Visits          1 year ago PTSD (post-traumatic stress disorder)   [facility] Smitty Cords, DO   3 years ago Baker's cyst of knee, left   [facility] [place], Netta Neat, DO   3 years ago Baker's cyst of knee, left   [facility] [place], Netta Neat, DO   3 years ago Acute bronchitis, unspecified organism   [facility] [place], Netta Neat, DO   4 years ago HYPERTENSION, BENIGN   [facility] Janeann Forehand., MD

## 2021-04-20 ENCOUNTER — Telehealth: Payer: Self-pay | Admitting: Obstetrics and Gynecology

## 2021-04-20 NOTE — Telephone Encounter (Signed)
Elizabeth Wyatt called in and states she would like a call from the nurse or Dr. Valentino Saxon.  Elizabeth Wyatt states she is having problems with her medication Duloxetine.  Elizabeth Wyatt refused to give me any more information.  Call back number is [phone].  Please advise.

## 2021-04-21 ENCOUNTER — Telehealth: Payer: Self-pay | Admitting: Family Medicine

## 2021-04-21 DIAGNOSIS — M159 Polyosteoarthritis, unspecified: Secondary | ICD-10-CM

## 2021-04-21 DIAGNOSIS — M8949 Other hypertrophic osteoarthropathy, multiple sites: Secondary | ICD-10-CM

## 2021-04-21 DIAGNOSIS — G629 Polyneuropathy, unspecified: Secondary | ICD-10-CM

## 2021-04-21 NOTE — Telephone Encounter (Addendum)
Pt does not want to make an appt with dr Kirtland Bouchard and will be est with new provider in July 2022. Pt would like a refill on gabapentin [facility] [address] in [place] phone number [phone]. Pt last seen dr Kirtland Bouchard 04-07-2020

## 2021-04-22 MED ORDER — GABAPENTIN 100 MG PO CAPS: 300.0000 mg | ORAL_CAPSULE | Freq: Every day | ORAL | 0 refills | Status: AC

## 2021-04-23 NOTE — Telephone Encounter (Signed)
Pt called no answer LM via VM to contact the office.

## 2021-04-27 ENCOUNTER — Ambulatory Visit (INDEPENDENT_AMBULATORY_CARE_PROVIDER_SITE_OTHER): Payer: 59 | Admitting: Obstetrics and Gynecology

## 2021-04-27 ENCOUNTER — Encounter: Payer: Self-pay | Admitting: Obstetrics and Gynecology

## 2021-04-27 ENCOUNTER — Other Ambulatory Visit: Payer: Self-pay

## 2021-04-27 VITALS — BP 102/78 | HR 80 | Ht <= 58 in | Wt 263.7 lb

## 2021-04-27 DIAGNOSIS — F4310 Post-traumatic stress disorder, unspecified: Secondary | ICD-10-CM

## 2021-04-27 DIAGNOSIS — Z760 Encounter for issue of repeat prescription: Secondary | ICD-10-CM

## 2021-04-27 DIAGNOSIS — Z6841 Body Mass Index (BMI) 40.0 and over, adult: Secondary | ICD-10-CM

## 2021-04-27 DIAGNOSIS — G43019 Migraine without aura, intractable, without status migrainosus: Secondary | ICD-10-CM | POA: Diagnosis not present

## 2021-04-27 NOTE — Progress Notes (Signed)
Pt present to request refill of duloxetine.

## 2021-04-27 NOTE — Patient Instructions (Signed)
Preventing Unhealthy Weight Gain, Adult Staying at a healthy weight is important to your overall health. When fat builds up in your body, you may become overweight or obese. Being overweight or obese increases your risk of developing certain health problems, such as heart disease, diabetes, sleeping problems, joint problems, and some types of cancer. Unhealthy weight gain is often the result of making unhealthy food choices or not getting enough exercise. You can make changes to your lifestyle to prevent obesity and stay as healthy as possible. What nutrition changes can be made?  Eat only as much as your body needs. To do this: ? Pay attention to signs that you are hungry or full. Stop eating as soon as you feel full. ? If you feel hungry, try drinking water first before eating. Drink enough water so your urine is clear or pale yellow. ? Eat smaller portions. Pay attention to portion sizes when eating out. ? Look at serving sizes on food labels. Most foods contain more than one serving per container. ? Eat the recommended number of calories for your gender and activity level. For most active people, a daily total of 2,000 calories is appropriate. If you are trying to lose weight or are not very active, you may need to eat fewer calories. Talk with your health care provider or a diet and nutrition specialist (dietitian) about how many calories you need each day.  Choose healthy foods, such as: ? Fruits and vegetables. At each meal, try to fill at least half of your plate with fruits and vegetables. ? Whole grains, such as whole-wheat bread, brown rice, and quinoa. ? Lean meats, such as chicken or fish. ? Other healthy proteins, such as beans, eggs, or tofu. ? Healthy fats, such as nuts, seeds, fatty fish, and olive oil. ? Low-fat or fat-free dairy products.  Check food labels, and avoid food and drinks that: ? Are high in calories. ? Have added sugar. ? Are high in sodium. ? Have saturated  fats or trans fats.  Cook foods in healthier ways, such as by baking, broiling, or grilling.  Make a meal plan for the week, and shop with a grocery list to help you stay on track with your purchases. Try to avoid going to the grocery store when you are hungry.  When grocery shopping, try to shop around the outside of the store first, where the fresh foods are. Doing this helps you to avoid prepackaged foods, which can be high in sugar, salt (sodium), and fat.   What lifestyle changes can be made?  Exercise for 30 or more minutes on 5 or more days each week. Exercising may include brisk walking, yard work, biking, running, swimming, and team sports like basketball and soccer. Ask your health care provider which exercises are safe for you.  Do muscle-strengthening activities, such as lifting weights or using resistance bands, on 2 or more days a week.  Do not use any products that contain nicotine or tobacco, such as cigarettes and e-cigarettes. If you need help quitting, ask your health care provider.  Limit alcohol intake to no more than 1 drink a day for nonpregnant women and 2 drinks a day for men. One drink equals 12 oz of beer, 5 oz of wine, or 1 oz of hard liquor.  Try to get 7-9 hours of sleep each night.   What other changes can be made?  Keep a food and activity journal to keep track of: ? What you ate and how   many calories you had. Remember to count the calories in sauces, dressings, and side dishes. ? Whether you were active, and what exercises you did. ? Your calorie, weight, and activity goals.  Check your weight regularly. Track any changes. If you notice you have gained weight, make changes to your diet or activity routine.  Avoid taking weight-loss medicines or supplements. Talk to your health care provider before starting any new medicine or supplement.  Talk to your health care provider before trying any new diet or exercise plan. Why are these changes  important? Eating healthy, staying active, and having healthy habits can help you to prevent obesity. Those changes also:  Help you manage stress and emotions.  Help you connect with friends and family.  Improve your self-esteem.  Improve your sleep.  Prevent long-term health problems. What can happen if changes are not made? Being obese or overweight can cause you to develop joint or bone problems, which can make it hard for you to stay active or do activities you enjoy. Being obese or overweight also puts stress on your heart and lungs and can lead to health problems like diabetes, heart disease, and some cancers. Where to find more information Talk with your health care provider or a dietitian about healthy eating and healthy lifestyle choices. You may also find information from:  U.[place]. Department of Agriculture, MyPlate: www.choosemyplate.gov  American Heart Association: www.heart.org  Centers for Disease Control and Prevention: www.cdc.gov Summary  Staying at a healthy weight is important to your overall health. It helps you to prevent certain diseases and health problems, such as heart disease, diabetes, joint problems, sleep disorders, and some types of cancer.  Being obese or overweight can cause you to develop joint or bone problems, which can make it hard for you to stay active or do activities you enjoy.  You can prevent unhealthy weight gain by eating a healthy diet, exercising regularly, not smoking, limiting alcohol, and getting enough sleep.  Talk with your health care provider or a dietitian for guidance about healthy eating and healthy lifestyle choices. This information is not intended to replace advice given to you by your health care provider. Make sure you discuss any questions you have with your health care provider. Document Revised: 03/26/2020 Document Reviewed: 03/26/2020 Elsevier Patient Education  2021 Elsevier Inc.  

## 2021-04-27 NOTE — Progress Notes (Signed)
    GYNECOLOGY PROGRESS NOTE  Subjective:    Patient ID: Elizabeth Wyatt, female    DOB: 06/15/1956, 65 y.o.   MRN: [id number]  HPI  Patient is a 65 y.o. G27P1001 female who presents for medication refill.  Patient reports that she is on Cymbalta, which is usually filled by her PCP however has had difficulty with getting her prescription from her PCP and notes she has had to request several different times.  States that she ended up weaning herself down on her medication to help stretch until a refill could be given.  Also notes that most recent prescription was only for 30 days.  She plans to establish with a another provider however her appointment is not until July.  Additionally, patient desires to discuss weight loss further.  States that she went over and the medication options that were given to her last visit.  With some of the side effects that she has been dealing with not having her Cymbalta and now weaning back up she is afraid of starting a new medication currently.  [name] does however report that she recently purchased a new exercise bike approximately 2 weeks ago.  Has been attempting to build up her exercise tolerance, right now doing approximately 3 minutes/day on the bike and will gradually work her self up.  She also has a friend who is using a bike as well.  Desires to continue discussion and management of her PTSD.    The following portions of the patient's history were reviewed and updated as appropriate: allergies, current medications, past family history, past medical history, past social history, past surgical history and problem list.  Review of Systems Pertinent items noted in HPI and remainder of comprehensive ROS otherwise negative.   Objective:   Blood pressure 102/78, pulse 80, height 4\' 8"  (1.422 m), weight 263 lb 11.2 oz (119.6 kg). General appearance: alert and no distress Remainder of exam deferred.    Assessment:   1. Encounter for medication refill    2. Intractable migraine without aura and without status migrainosus   3. Morbid obesity with BMI of 50.0-59.9, adult (HCC)   4. PTSD (post-traumatic stress disorder)     Plan:   1.  Medication refill -patient with a history of migraines, using Cymbalta for management.  I discussed that I typically do not refill medications that I personally have not prescribed, however will refill for 50-month supply until patient is able to establish with a new provider to avoid abrupt cessation of medication with associated side effects. 2.  Obesity counseling -patient currently wants to hold off on starting any medication for obesity as she is very equilibrating herself with her Cymbalta.  Lengthy discussion had about dietary modifications and healthy eating habits.  Continue to encourage exercise as tolerated, with exercise bike.  Patient currently biking for approximately 3 minutes/day, advised to try to increase each week by 2 minutes.   3. PTSD - allowed continued discussion of past family and social history, desires to continue sessions monthly as she feels discussions are cathartic.    A total of 40 minutes were spent face-to-face with the patient during the encounter and over half of that time involved counseling and coordination of care.   3-month, MD [facility]

## 2021-05-03 ENCOUNTER — Ambulatory Visit
Admission: RE | Admit: 2021-05-03 | Discharge: 2021-05-03 | Disposition: A | Discharge status: Home / Self Care | Payer: 59 | Source: Ambulatory Visit | Attending: General Surgery | Admitting: General Surgery

## 2021-05-03 ENCOUNTER — Other Ambulatory Visit: Payer: Self-pay

## 2021-05-03 DIAGNOSIS — Z129 Encounter for screening for malignant neoplasm, site unspecified: Secondary | ICD-10-CM

## 2021-05-03 DIAGNOSIS — Z1231 Encounter for screening mammogram for malignant neoplasm of breast: Secondary | ICD-10-CM | POA: Insufficient documentation

## 2021-05-05 ENCOUNTER — Telehealth: Payer: Self-pay

## 2021-05-05 ENCOUNTER — Other Ambulatory Visit: Payer: Self-pay | Admitting: General Surgery

## 2021-05-05 DIAGNOSIS — N6489 Other specified disorders of breast: Secondary | ICD-10-CM

## 2021-05-05 DIAGNOSIS — R928 Other abnormal and inconclusive findings on diagnostic imaging of breast: Secondary | ICD-10-CM

## 2021-05-05 NOTE — Telephone Encounter (Signed)
Patient needs appointment with Dr.Pabon 3-4 weeks for breast asymmetry. Please schedule when patient calls.

## 2021-05-12 ENCOUNTER — Ambulatory Visit
Admission: RE | Admit: 2021-05-12 | Discharge: 2021-05-12 | Disposition: A | Discharge status: Home / Self Care | Payer: 59 | Source: Ambulatory Visit | Attending: General Surgery | Admitting: General Surgery

## 2021-05-12 ENCOUNTER — Other Ambulatory Visit: Payer: Self-pay

## 2021-05-12 DIAGNOSIS — N6489 Other specified disorders of breast: Secondary | ICD-10-CM | POA: Diagnosis present

## 2021-05-12 DIAGNOSIS — R928 Other abnormal and inconclusive findings on diagnostic imaging of breast: Secondary | ICD-10-CM | POA: Insufficient documentation

## 2021-05-24 ENCOUNTER — Other Ambulatory Visit: Payer: Self-pay | Admitting: Family Medicine

## 2021-05-24 DIAGNOSIS — F331 Major depressive disorder, recurrent, moderate: Secondary | ICD-10-CM

## 2021-05-24 DIAGNOSIS — F4310 Post-traumatic stress disorder, unspecified: Secondary | ICD-10-CM

## 2021-05-24 NOTE — Telephone Encounter (Signed)
Requested medications are due for refill today yes  Requested medications are on the active medication list yes  Last refill 5/5  Last visit 03/2020  Future visit scheduled no  Notes to clinic Has already had a curtesy refill and there is no upcoming appointment scheduled.

## 2021-05-25 ENCOUNTER — Encounter: Payer: Self-pay | Admitting: Obstetrics and Gynecology

## 2021-05-25 ENCOUNTER — Other Ambulatory Visit: Payer: Self-pay | Admitting: Family Medicine

## 2021-05-25 ENCOUNTER — Ambulatory Visit (INDEPENDENT_AMBULATORY_CARE_PROVIDER_SITE_OTHER): Payer: 59 | Admitting: Obstetrics and Gynecology

## 2021-05-25 ENCOUNTER — Other Ambulatory Visit: Payer: Self-pay

## 2021-05-25 VITALS — BP 126/82 | HR 82 | Ht <= 58 in | Wt 260.9 lb

## 2021-05-25 DIAGNOSIS — E034 Atrophy of thyroid (acquired): Secondary | ICD-10-CM

## 2021-05-25 DIAGNOSIS — Z6841 Body Mass Index (BMI) 40.0 and over, adult: Secondary | ICD-10-CM | POA: Diagnosis not present

## 2021-05-25 DIAGNOSIS — R7303 Prediabetes: Secondary | ICD-10-CM | POA: Diagnosis not present

## 2021-05-25 DIAGNOSIS — F4310 Post-traumatic stress disorder, unspecified: Secondary | ICD-10-CM | POA: Diagnosis not present

## 2021-05-25 DIAGNOSIS — E785 Hyperlipidemia, unspecified: Secondary | ICD-10-CM

## 2021-05-25 DIAGNOSIS — Z7689 Persons encountering health services in other specified circumstances: Secondary | ICD-10-CM

## 2021-05-25 DIAGNOSIS — K219 Gastro-esophageal reflux disease without esophagitis: Secondary | ICD-10-CM

## 2021-05-25 DIAGNOSIS — G43019 Migraine without aura, intractable, without status migrainosus: Secondary | ICD-10-CM

## 2021-05-25 MED ORDER — LEVOTHYROXINE SODIUM 50 MCG PO TABS: 50.0000 ug | ORAL_TABLET | Freq: Every day | ORAL | 0 refills | Status: AC

## 2021-05-25 MED ORDER — OMEPRAZOLE 20 MG PO CPDR: 20.0000 mg | DELAYED_RELEASE_CAPSULE | Freq: Every day | ORAL | 0 refills | Status: AC

## 2021-05-25 MED ORDER — ATORVASTATIN CALCIUM 10 MG PO TABS
1.0000 | ORAL_TABLET | Freq: Every day | ORAL | 0 refills | Status: DC
Start: 2021-05-25 — End: 2023-06-12

## 2021-05-25 MED ORDER — DULOXETINE HCL 30 MG PO CPEP: 90.0000 mg | ORAL_CAPSULE | Freq: Every day | ORAL | 1 refills | Status: DC

## 2021-05-25 NOTE — Progress Notes (Signed)
GYNECOLOGY PROGRESS NOTE  Subjective:    Patient ID: Elizabeth Wyatt, female    DOB: Feb 25, 1956, 65 y.o.   MRN: [id number]  HPI  Patient is a 65 y.o. female with morbid obesity, prediabetes, PTSD, and migraines who presents for 1 month weight management follow up. She initiated dietary modifications and exercise since her last visit.  She reports that she has overall been attempting to eat healthier (eating leaner red meats, trying to cut down on carbohydrates such as bread) and increasing her water intake.   Current interventions:  1. Diet -see above.  Patient notes that for breakfast is typically a banana.  For lunch she may have a pimento cheese sandwich and blueberries.  Dinner is typically made by her autistic son, and typically includes a meatball slope or spaghetti.  She does report she is still having some issue with her sweets as she still will buy 2 boxes of Milk Duds however will consume over the course of 1 week.  She will not eat all of them in 1 sitting and usually shares with her son. 2. Activity -was attempting to use an exercise bike however notes that the bike pedals are difficult to reach due to her short stature.  She does plan on exchanging this bike for a different 1.  She does also report having issues with her knees lately (has had problems for years however is worsening).  Notes that she was going to be referred to Dr. Ernest Pine for consideration of surgical intervention.  3. Reports bowel movements are normal.    Additionally patient is still noting difficulty getting her Cymbalta from her PCP.  States she has contacted them several times and still could not get a prescription.  Is becoming very stressed out and anxious as she notes that this medication will help to prevent seizures or strokes from potential migraines.  Also with her history of PTSD she feels that this is "a controlling move" as she feels she has to depend on and wait on a man for her needs.  Is aware that  she has trust issues and notes that she does not feel that her provider cares about her.  She is planning on establishing with a new PCP soon.   Is also very worried about the status of her son if she were to pass on.  Notes that she has sole custody of her son, and that her ex-husband has never really been involved with her or her son, however he does still provide some financial assistance with some bills.  Is worried that her son would become a custody of the state.    The following portions of the patient's history were reviewed and updated as appropriate: allergies, current medications, past family history, past medical history, past social history, past surgical history, and problem list.  Review of Systems Pertinent items noted in HPI and remainder of comprehensive ROS otherwise negative.   Objective:    Vitals with BMI 05/25/2021 04/27/2021 02/09/2021  Height 4\' 8"  4\' 8"  4\' 8"   Weight 260 lbs 14 oz 263 lbs 11 oz 262 lbs 14 oz  BMI 58.53 59.15 58.97  Systolic 126 102  Diastolic 82 78 78  Pulse 82 80 -    General appearance: alert and no distress Abdomen: soft, non-tender.  Waist circumference did not obtain.    Labs:  Assessment:   Weight management Obesity, Body mass index is 58.49 kg/m. PTSD (post-traumatic stress disorder)  Prediabetes Intractable migraine without  aura and without status migrainosus   Plan:   Patient has lost 3 pounds since her last visit.  She is excited that she has now been able to wear shorts confidently for the first time in several years.  I commended patient on her progress.  Continue to encourage dietary modification and exercise.  Currently encouraged to continue to work on carbohydrate reduction and increasing vegetable and low glycemic index fruits.  Can offer patient referral to diabetes teaching in light of her new diagnosis of prediabetes  Will refill patient's Cymbalta as she has had difficulties obtaining a prescription.  Plans to see  a new PCP in approximately 2 months, who can then pick up her prescription at that time.  Also discussed other options for management of her PTSD including counseling, and other newer modalities such as TMS.  Patient again hesitant but may consider in the future.  Also advised to speak with new PCP regarding care management plans and living will for her son. Discussed prediabetes, advised that patient would likely benefit from a medication to help manage her blood sugars as well as one that could help accelerate her weight loss.  Patient hesitant on starting a new medication at this time.  However may consider in the future when she establishes with her new PCP.  We will continue to work with dietary modification and exercise for now.    A total of 30 minutes were spent face-to-face with the patient during this encounter and over half of that time involved counseling and coordination of care.

## 2021-05-25 NOTE — Progress Notes (Signed)
Pt present for weight check and PTSD. Pt stated that she was doing well. Pt's last visit 04/27/2021 wt 263lb.

## 2021-05-25 NOTE — Telephone Encounter (Signed)
Already given patient 30 day rx back in 04/2021 when she has opted out of leaving out practice. She was anticipating on relocating to new PCP.  It has not been 24 hours since we received this refill request.  She has already seen GYN Dr Valentino Saxon and received a refill today on this medication - Duloxetine.  Therefore, I will decline this additional duplicate refill request.  Saralyn Pilar, DO [facility] 05/25/2021, 3:21 PM

## 2021-05-25 NOTE — Telephone Encounter (Signed)
Patient called in to inform Dr Kirtland Bouchard that she have been off this medication for one day and will be filing a complaint against Dr Kirtland Bouchard since she feel like he is with holding her medication and she does not want to suffer the issues she did last time she was off the medication. Patient disconnected call before any further discussion. Please advise

## 2021-05-25 NOTE — Addendum Note (Signed)
Addended by: Smitty Cords on: 05/25/2021 05:03 PM   Modules accepted: Orders

## 2021-05-25 NOTE — Telephone Encounter (Signed)
Patient has upcoming apt with new PCP Dr Judithann Sheen 06/29/21. [facility].  I will agree to order 90 day supply to last until that apt. Then all meds can be transferred to [facility] orders Omeprazole, Atorvastatin, Levothyroxine  FYI  Saralyn Pilar, DO [facility] 05/25/2021, 5:03 PM

## 2021-05-25 NOTE — Telephone Encounter (Signed)
Patient has self discharged earlier in May 2022. She has declined follow-up and is currently transferring care to other PCP as she has reported to our office.  She was given 30 day refills previously already in May 2022.  Unless she has a specific new patient apt time with new provider and needs a specific amount of medication to get to that apt within next 30-90 days of her discharge from our practice, then I would not be able to keep filling the meds without that information.  Saralyn Pilar, DO [facility] 05/25/2021, 3:23 PM

## 2021-05-25 NOTE — Telephone Encounter (Signed)
Requested medication (s) are due for refill today: no  Requested medication (s) are on the active medication list: yes  Last refill: 05/16/2021  Future visit scheduled: No  Notes to clinic: overdue for office visit Vm has been left for patient to callback    Requested Prescriptions  Pending Prescriptions Disp Refills   atorvastatin (LIPITOR) 10 MG tablet [Pharmacy Med Name: ATORVASTATIN 10 MG TABLET] 30 tablet 2    Sig: TAKE 1 TABLET BY MOUTH EVERY DAY      Cardiovascular:  Antilipid - Statins Failed - 05/25/2021  9:58 AM      Failed - Total Cholesterol in normal range and within 360 days    Cholesterol, Total  Date Value Ref Range Status  04/15/2016 164 100 - 199 mg/dL Final          Failed - LDL in normal range and within 360 days    LDL Calculated  Date Value Ref Range Status  04/15/2016 44 0 - 99 mg/dL Final   Direct LDL  Date Value Ref Range Status  10/24/2013 123.0 mg/dL Final    Comment:    Optimal:  <100 mg/dLNear or Above Optimal:  100-129 mg/dLBorderline High:  130-159 mg/dLHigh:  160-189 mg/dLVery High:  >190 mg/dL          Failed - HDL in normal range and within 360 days    HDL  Date Value Ref Range Status  04/15/2016 94 >39 mg/dL Final          Failed - Triglycerides in normal range and within 360 days    Triglycerides  Date Value Ref Range Status  04/15/2016 128 0 - 149 mg/dL Final          Failed - Valid encounter within last 12 months    Recent Outpatient Visits           1 year ago PTSD (post-traumatic stress disorder)   [facility] Smitty Cords, DO   3 years ago Baker's cyst of knee, left   [facility] [place], Netta Neat, DO   3 years ago Baker's cyst of knee, left   [facility] [place], Netta Neat, DO   3 years ago Acute bronchitis, unspecified organism   [facility] [place], Netta Neat, DO   4 years ago HYPERTENSION, BENIGN   [facility] Janeann Forehand., MD                Passed - Patient is not pregnant        omeprazole (PRILOSEC) 20 MG capsule [Pharmacy Med Name: OMEPRAZOLE DR 20 MG CAPSULE] 30 capsule 5    Sig: TAKE 1 CAPSULE BY MOUTH EVERY DAY      Gastroenterology: Proton Pump Inhibitors Failed - 05/25/2021  9:58 AM      Failed - Valid encounter within last 12 months    Recent Outpatient Visits           1 year ago PTSD (post-traumatic stress disorder)   [facility] [place], Netta Neat, DO   3 years ago Baker's cyst of knee, left   [facility], Netta Neat, DO   3 years ago Baker's cyst of knee, left   [facility] [place], Netta Neat, DO   3 years ago Acute bronchitis, unspecified organism   [facility] [place], Netta Neat, DO   4 years ago HYPERTENSION, BENIGN   [facility] Juanetta Gosling, Teresita Madura., MD                  levothyroxine (SYNTHROID) 50 MCG tablet [Pharmacy Med Name: LEVOTHYROXINE 50 MCG TABLET] 30 tablet 2    Sig: TAKE 1 TABLET BY MOUTH EVERY DAY      Endocrinology:  Hypothyroid Agents Failed - 05/25/2021  9:58 AM      Failed - TSH needs to be rechecked within 3 months after an abnormal result. Refill until TSH is due.      Failed - TSH in normal range and within 360 days    TSH  Date Value Ref Range Status  03/06/2017 3.53 mIU/L Final    Comment:      Reference Range   > or = 20 Years  0.40-4.50   Pregnancy Range First trimester  0.26-2.66 Second trimester 0.55-2.73 Third trimester  0.43-2.91             Failed - Valid encounter within last 12 months    Recent Outpatient Visits           1 year ago PTSD (post-traumatic stress disorder)   [facility] Smitty Cords, DO   3 years ago Baker's cyst of knee, left   [facility] [place], Netta Neat, DO   3 years ago Baker's cyst of knee, left    [facility] [place], Netta Neat, DO   3 years ago Acute bronchitis, unspecified organism   [facility] [place], Netta Neat, DO   4 years ago HYPERTENSION, BENIGN   [facility] Janeann Forehand., MD

## 2021-05-26 ENCOUNTER — Encounter: Payer: Self-pay | Admitting: Obstetrics and Gynecology

## 2021-05-26 NOTE — Patient Instructions (Signed)
Diabetes Care, 44(Suppl 1), S34-S39. https://doi.org/https://doi.org/[email or web address]/[email or web address]">  Prediabetes Prediabetes is when your blood sugar (blood glucose) level is higher than normal but not high enough for you to be diagnosed with type 2 diabetes. Having prediabetes puts you at risk for developing type 2 diabetes (type 2 diabetes mellitus). With certain lifestyle changes, you may be able to prevent or delay the onset of type 2 diabetes. This is important because type 2 diabetes can lead to serious complications, such as: Heart disease. Stroke. Blindness. Kidney disease. Depression. Poor circulation in the feet and legs. In severe cases, this could lead to surgical removal of a leg (amputation). What are the causes? The exact cause of prediabetes is not known. It may result from insulin resistance. Insulin resistance develops when cells in the body do not respond properly to insulin that the body makes. This can cause excess glucose to build up in the blood. High blood glucose (hyperglycemia) can develop. What increases the risk? The following factors may make you more likely to develop this condition: You have a family member with type 2 diabetes. You are older than 45 years. You had a temporary form of diabetes during a pregnancy (gestational diabetes). You had polycystic ovary syndrome (PCOS). You are overweight or obese. You are inactive (sedentary). You have a history of heart disease, including problems with cholesterol levels, high levels of blood fats, or high blood pressure. What are the signs or symptoms? You may have no symptoms. If you do have symptoms, they may include: Increased hunger. Increased thirst. Increased urination. Vision changes, such as blurry vision. Tiredness (fatigue). How is this diagnosed? This condition can be diagnosed with blood tests. Your blood glucose may be checked with one or more of the following tests: A fasting blood glucose (FBG) test. You will  not be allowed to eat (you will fast) for at least 8 hours before a blood sample is taken. An A1C blood test (hemoglobin A1C). This test provides information about blood glucose levels over the previous 2?3 months. An oral glucose tolerance test (OGTT). This test measures your blood glucose at two points in time: After fasting. This is your baseline level. Two hours after you drink a beverage that contains glucose. You may be diagnosed with prediabetes if: Your FBG is 100?125 mg/dL (5.6-6.9 mmol/L). Your A1C level is 5.7?6.4% (39-46 mmol/mol). Your OGTT result is 140?199 mg/dL (7.8-11 mmol/L). These blood tests may be repeated to confirm your diagnosis. How is this treated? Treatment may include dietary and lifestyle changes to help lower your blood glucose and prevent type 2 diabetes from developing. In some cases, medicinemay be prescribed to help lower the risk of type 2 diabetes. Follow these instructions at home: Nutrition  Follow a healthy meal plan. This includes eating lean proteins, whole grains, legumes, fresh fruits and vegetables, low-fat dairy products, and healthy fats. Follow instructions from your health care provider about eating or drinking restrictions. Meet with a dietitian to create a healthy eating plan that is right for you.  Lifestyle Do moderate-intensity exercise for at least 30 minutes a day on 5 or more days each week, or as told by your health care provider. A mix of activities may be best, such as: Brisk walking, swimming, biking, and weight lifting. Lose weight as told by your health care provider. Losing 5-7% of your body weight can reverse insulin resistance. Do not drink alcohol if: Your health care provider tells you not to drink. You are pregnant, may be pregnant, or are planning   to become pregnant. If you drink alcohol: Limit how much you use to: 0-1 drink a day for women. 0-2 drinks a day for men. Be aware of how much alcohol is in your drink. In  the [place] one drink equals one 12 oz bottle of beer (355 mL), one 5 oz glass of wine (148 mL), or one 1 oz glass of hard liquor (44 mL). General instructions Take over-the-counter and prescription medicines only as told by your health care provider. You may be prescribed medicines that help lower the risk of type 2 diabetes. Do not use any products that contain nicotine or tobacco, such as cigarettes, e-cigarettes, and chewing tobacco. If you need help quitting, ask your health care provider. Keep all follow-up visits. This is important. Where to find more information American Diabetes Association: www.diabetes.org Academy of Nutrition and Dietetics: www.eatright.org American Heart Association: www.heart.org Contact a health care provider if: You have any of these symptoms: Increased hunger. Increased urination. Increased thirst. Fatigue. Vision changes, such as blurry vision. Get help right away if you: Have shortness of breath. Feel confused. Vomit or feel like you may vomit. Summary Prediabetes is when your blood sugar (blood glucose)level is higher than normal but not high enough for you to be diagnosed with type 2 diabetes. Having prediabetes puts you at risk for developing type 2 diabetes (type 2 diabetes mellitus). Make lifestyle changes such as eating a healthy diet and exercising regularly to help prevent diabetes. Lose weight as told by your health care provider. This information is not intended to replace advice given to you by your health care provider. Make sure you discuss any questions you have with your healthcare provider. Document Revised: 02/27/2020 Document Reviewed: 02/27/2020 Elsevier Patient Education  2022 Elsevier Inc.     Prediabetes Eating Plan Prediabetes is a condition that causes blood sugar (glucose) levels to be higher than normal. This increases the risk for developing type 2 diabetes (type 2 diabetes mellitus). Working with a health care provider or  nutrition specialist (dietitian) to make diet and lifestyle changes can help prevent the onset of diabetes. These changes may help you: Control your blood glucose levels. Improve your cholesterol levels. Manage your blood pressure. What are tips for following this plan? Reading food labels Read food labels to check the amount of fat, salt (sodium), and sugar in prepackaged foods. Avoid foods that have: Saturated fats. Trans fats. Added sugars. Avoid foods that have more than 300 milligrams (mg) of sodium per serving. Limit your sodium intake to less than 2,300 mg each day. Shopping Avoid buying pre-made and processed foods. Avoid buying drinks with added sugar. Cooking Cook with olive oil. Do not use butter, lard, or ghee. Bake, broil, grill, steam, or boil foods. Avoid frying. Meal planning  Work with your dietitian to create an eating plan that is right for you. This may include tracking how many calories you take in each day. Use a food diary, notebook, or mobile application to track what you eat at each meal. Consider following a Mediterranean diet. This includes: Eating several servings of fresh fruits and vegetables each day. Eating fish at least twice a week. Eating one serving each day of whole grains, beans, nuts, and seeds. Using olive oil instead of other fats. Limiting alcohol. Limiting red meat. Using nonfat or low-fat dairy products. Consider following a plant-based diet. This includes dietary choices that focus on eating mostly vegetables and fruit, grains, beans, nuts, and seeds. If you have high blood pressure, you  may need to limit your sodium intake or follow a diet such as the DASH (Dietary Approaches to Stop Hypertension) eating plan. The DASH diet aims to lower high blood pressure.  Lifestyle Set weight loss goals with help from your health care team. It is recommended that most people with prediabetes lose 7% of their body weight. Exercise for at least 30  minutes 5 or more days a week. Attend a support group or seek support from a mental health counselor. Take over-the-counter and prescription medicines only as told by your health care provider. What foods are recommended? Fruits Berries. Bananas. Apples. Oranges. Grapes. Papaya. Mango. Pomegranate. Kiwi.Grapefruit. Cherries. Vegetables Lettuce. Spinach. Peas. Beets. Cauliflower. Cabbage. Broccoli. Carrots.Tomatoes. Squash. Eggplant. Herbs. Peppers. Onions. Cucumbers. Brussels sprouts. Grains Whole grains, such as whole-wheat or whole-grain breads, crackers, cereals, and pasta. Unsweetened oatmeal. Bulgur. Barley. Quinoa. Brown rice. Corn orwhole-wheat flour tortillas or taco shells. Meats and other proteins Seafood. Poultry without skin. Lean cuts of pork and beef. Tofu. Eggs. Nuts.Beans. Dairy Low-fat or fat-free dairy products, such as yogurt, cottage cheese, and cheese. Beverages Water. Tea. Coffee. Sugar-free or diet soda. Seltzer water. Low-fat or nonfatmilk. Milk alternatives, such as soy or almond milk. Fats and oils Olive oil. Canola oil. Sunflower oil. Grapeseed oil. Avocado. Walnuts. Sweets and desserts Sugar-free or low-fat pudding. Sugar-free or low-fat ice cream and other frozentreats. Seasonings and condiments Herbs. Sodium-free spices. Mustard. Relish. Low-salt, low-sugar ketchup.Low-salt, low-sugar barbecue sauce. Low-fat or fat-free mayonnaise. The items listed above may not be a complete list of recommended foods and beverages. Contact a dietitian for more information. What foods are not recommended? Fruits Fruits canned with syrup. Vegetables Canned vegetables. Frozen vegetables with butter or cream sauce. Grains Refined white flour and flour products, such as bread, pasta, snack foods, andcereals. Meats and other proteins Fatty cuts of meat. Poultry with skin. Breaded or fried meat. Processed meats. Dairy Full-fat yogurt, cheese, or milk. Beverages Sweetened  drinks, such as iced tea and soda. Fats and oils Butter. Lard. Ghee. Sweets and desserts Baked goods, such as cake, cupcakes, pastries, cookies, and cheesecake. Seasonings and condiments Spice mixes with added salt. Ketchup. Barbecue sauce. Mayonnaise. The items listed above may not be a complete list of foods and beverages that are not recommended. Contact a dietitian for more information. Where to find more information American Diabetes Association: www.diabetes.org Summary You may need to make diet and lifestyle changes to help prevent the onset of diabetes. These changes can help you control blood sugar, improve cholesterol levels, and manage blood pressure. Set weight loss goals with help from your health care team. It is recommended that most people with prediabetes lose 7% of their body weight. Consider following a Mediterranean diet. This includes eating plenty of fresh fruits and vegetables, whole grains, beans, nuts, seeds, fish, and low-fat dairy, and using olive oil instead of other fats. This information is not intended to replace advice given to you by your health care provider. Make sure you discuss any questions you have with your healthcare provider. Document Revised: 02/27/2020 Document Reviewed: 02/27/2020 Elsevier Patient Education  2022 Elsevier Inc.    Preventing Unhealthy [employer], Adult Staying at a healthy weight is important to your overall health. When fat builds up in your body, you may become overweight or obese. Being overweight or obese increases your risk of developing certain health problems, such as heartdisease, diabetes, sleeping problems, joint problems, and some types of cancer. Unhealthy weight gain is often the result of making unhealthy  food choices or not getting enough exercise. You can make changes to your lifestyle to preventobesity and stay as healthy as possible. What nutrition changes can be made?  Eat only as much as your body needs. To do  this: Pay attention to signs that you are hungry or full. Stop eating as soon as you feel full. If you feel hungry, try drinking water first before eating. Drink enough water so your urine is clear or pale yellow. Eat smaller portions. Pay attention to portion sizes when eating out. Look at serving sizes on food labels. Most foods contain more than one serving per container. Eat the recommended number of calories for your gender and activity level. For most active people, a daily total of 2,000 calories is appropriate. If you are trying to lose weight or are not very active, you may need to eat fewer calories. Talk with your health care provider or a diet and nutrition specialist (dietitian) about how many calories you need each day. Choose healthy foods, such as: Fruits and vegetables. At each meal, try to fill at least half of your plate with fruits and vegetables. Whole grains, such as whole-wheat bread, brown rice, and quinoa. Lean meats, such as chicken or fish. Other healthy proteins, such as beans, eggs, or tofu. Healthy fats, such as nuts, seeds, fatty fish, and olive oil. Low-fat or fat-free dairy products. Check food labels, and avoid food and drinks that: Are high in calories. Have added sugar. Are high in sodium. Have saturated fats or trans fats. Cook foods in healthier ways, such as by baking, broiling, or grilling. Make a meal plan for the week, and shop with a grocery list to help you stay on track with your purchases. Try to avoid going to the grocery store when you are hungry. When grocery shopping, try to shop around the outside of the store first, where the fresh foods are. Doing this helps you to avoid prepackaged foods, which can be high in sugar, salt (sodium), and fat. What lifestyle changes can be made?  Exercise for 30 or more minutes on 5 or more days each week. Exercising may include brisk walking, yard work, biking, running, swimming, and team sports like  basketball and soccer. Ask your health care provider which exercises are safe for you. Do muscle-strengthening activities, such as lifting weights or using resistance bands, on 2 or more days a week. Do not use any products that contain nicotine or tobacco, such as cigarettes and e-cigarettes. If you need help quitting, ask your health care provider. Limit alcohol intake to no more than 1 drink a day for nonpregnant women and 2 drinks a day for men. One drink equals 12 oz of beer, 5 oz of wine, or 1 oz of hard liquor. Try to get 7-9 hours of sleep each night. What other changes can be made? Keep a food and activity journal to keep track of: What you ate and how many calories you had. Remember to count the calories in sauces, dressings, and side dishes. Whether you were active, and what exercises you did. Your calorie, weight, and activity goals. Check your weight regularly. Track any changes. If you notice you have gained weight, make changes to your diet or activity routine. Avoid taking weight-loss medicines or supplements. Talk to your health care provider before starting any new medicine or supplement. Talk to your health care provider before trying any new diet or exercise plan. Why are these changes important? Eating healthy, staying active, and  having healthy habits can help you to prevent obesity. Those changes also: Help you manage stress and emotions. Help you connect with friends and family. Improve your self-esteem. Improve your sleep. Prevent long-term health problems. What can happen if changes are not made? Being obese or overweight can cause you to develop joint or bone problems, which can make it hard for you to stay active or do activities you enjoy. Being obese or overweight also puts stress on your heart and lungs and can lead tohealth problems like diabetes, heart disease, and some cancers. Where to find more information Talk with your health care provider or a dietitian  about healthy eating and healthy lifestyle choices. You may also find information from: [place]. Department of Agriculture, MyPlate: https://ball-collins.biz/ American Heart Association: www.heart.org Centers for Disease Control and Prevention: FootballExhibition.com.br Summary Staying at a healthy weight is important to your overall health. It helps you to prevent certain diseases and health problems, such as heart disease, diabetes, joint problems, sleep disorders, and some types of cancer. Being obese or overweight can cause you to develop joint or bone problems, which can make it hard for you to stay active or do activities you enjoy. You can prevent unhealthy weight gain by eating a healthy diet, exercising regularly, not smoking, limiting alcohol, and getting enough sleep. Talk with your health care provider or a dietitian for guidance about healthy eating and healthy lifestyle choices. This information is not intended to replace advice given to you by your health care provider. Make sure you discuss any questions you have with your healthcare provider. Document Revised: 03/26/2020 Document Reviewed: 03/26/2020 Elsevier Patient Education  2022 [employer].

## 2021-06-02 ENCOUNTER — Other Ambulatory Visit: Payer: Self-pay | Admitting: Orthopedic Surgery

## 2021-06-02 DIAGNOSIS — G8929 Other chronic pain: Secondary | ICD-10-CM

## 2021-06-12 ENCOUNTER — Other Ambulatory Visit: Payer: Self-pay

## 2021-06-12 ENCOUNTER — Ambulatory Visit
Admission: RE | Admit: 2021-06-12 | Discharge: 2021-06-12 | Disposition: A | Discharge status: Home / Self Care | Payer: 59 | Source: Ambulatory Visit | Attending: Orthopedic Surgery | Admitting: Orthopedic Surgery

## 2021-06-12 DIAGNOSIS — M25562 Pain in left knee: Secondary | ICD-10-CM | POA: Diagnosis present

## 2021-06-12 DIAGNOSIS — G8929 Other chronic pain: Secondary | ICD-10-CM | POA: Insufficient documentation

## 2021-06-20 ENCOUNTER — Other Ambulatory Visit: Payer: Self-pay | Admitting: Family Medicine

## 2021-06-20 DIAGNOSIS — R4182 Altered mental status, unspecified: Secondary | ICD-10-CM

## 2021-06-20 NOTE — Telephone Encounter (Signed)
Requested medication (s) are due for refill today: yes  Requested medication (s) are on the active medication list: yes  Last refill:  05/25/20 #15 with 12 RF  Future visit scheduled: no  Notes to clinic:  rerouted to Dr Judithann Sheen-   Requested Prescriptions  Pending Prescriptions Disp Refills   topiramate (TOPAMAX) 50 MG tablet [Pharmacy Med Name: TOPIRAMATE 50 MG TABLET] 15 tablet 12    Sig: TAKE 1/2 TABLET BY MOUTH EVERY DAY AS DIRECTED      Not Delegated - Neurology: Anticonvulsants - topiramate & zonisamide Failed - 06/20/2021  9:12 AM      Failed - This refill cannot be delegated      Failed - Cr in normal range and within 360 days    Creatinine, Ser  Date Value Ref Range Status  04/15/2016 0.99 0.57 - 1.00 mg/dL Final          Failed - CO2 in normal range and within 360 days    CO2  Date Value Ref Range Status  04/15/2016 22 18 - 29 mmol/L Final          Failed - Valid encounter within last 12 months    Recent Outpatient Visits           1 year ago PTSD (post-traumatic stress disorder)   [facility] Smitty Cords, DO   3 years ago Baker's cyst of knee, left   [facility] [place], Netta Neat, DO   3 years ago Baker's cyst of knee, left   [facility] [place], Netta Neat, DO   3 years ago Acute bronchitis, unspecified organism   [facility] [place], Netta Neat, DO   4 years ago HYPERTENSION, BENIGN   [facility] Janeann Forehand., MD       Future Appointments             In 2 weeks Gollan, Tollie Pizza, MD [facility] [place]

## 2021-06-24 ENCOUNTER — Encounter: Payer: Self-pay | Admitting: Obstetrics and Gynecology

## 2021-06-24 ENCOUNTER — Ambulatory Visit (INDEPENDENT_AMBULATORY_CARE_PROVIDER_SITE_OTHER): Payer: 59 | Admitting: Obstetrics and Gynecology

## 2021-06-24 ENCOUNTER — Other Ambulatory Visit: Payer: Self-pay

## 2021-06-24 VITALS — BP 112/71 | HR 79 | Ht <= 58 in | Wt 259.0 lb

## 2021-06-24 DIAGNOSIS — F4310 Post-traumatic stress disorder, unspecified: Secondary | ICD-10-CM

## 2021-06-24 DIAGNOSIS — R7303 Prediabetes: Secondary | ICD-10-CM

## 2021-06-24 DIAGNOSIS — Z7689 Persons encountering health services in other specified circumstances: Secondary | ICD-10-CM | POA: Diagnosis not present

## 2021-06-24 DIAGNOSIS — Z6841 Body Mass Index (BMI) 40.0 and over, adult: Secondary | ICD-10-CM

## 2021-06-24 MED ORDER — SERTRALINE HCL 50 MG PO TABS: 50.0000 mg | ORAL_TABLET | Freq: Every day | ORAL | 3 refills | Status: DC

## 2021-06-24 NOTE — Progress Notes (Signed)
GYNECOLOGY PROGRESS NOTE  Subjective:    Patient ID: Elizabeth Wyatt, female    DOB: 12-06-1956, 65 y.o.   MRN: [id number]  HPI  Patient is a 65 y.o. G39P1001 female with morbid obesity, prediabetes, PTSD, and migraines who presents for 1 month follow up of her PTSD and for weight management follow up.   Weight management:  She has continued to work on dietary modifications.  Has not been able to exercise much this past out due to increasing issues with joint pain.  Has an appointment later this afternoon with Orthopedist.  May be considering surgical intervention.  Still is excited that she has lost another pound since last visit.    PTSD:  Reports that on July 3, she had an MRI for her joint pain for her knees.  Reports that her ex-husband had to transport her to and from her appoitment as she did not have any other mode of transportation.  Had a "meltdown" afterwards as it triggered her emotions and PTSD. States that he still has not fixed her bathroom in her home (has been over a month, is barely inhabitable),  but will fix at the end of the moth. Reports that her sleep is off and her anxiety is heightened.   She reports that she is also dealing with her son [name] (autitistic) having resentment due to having to care for her. He is present with her today for her appointment as she desires for him to meet her provider.  She still stresses over what will happen to him and who will take care of him in the event of her death, as she has sole custody of him and believes her ex-husband is unfit and would not have the desire to care for him.    The following portions of the patient's history were reviewed and updated as appropriate: allergies, current medications, past family history, past medical history, past social history, past surgical history, and problem list.  Review of Systems Pertinent items noted in HPI and remainder of comprehensive ROS otherwise negative.   Objective:     Vitals with BMI 06/24/2021 05/25/2021 04/27/2021  Height 4\' 8"  4\' 8"  4\' 8"   Weight 259 lbs 260 lbs 14 oz 263 lbs 11 oz  BMI 58.1 58.53 59.15  Systolic 112 126  Diastolic 71 82 78  Pulse 79 82 80    General appearance: alert and no distress, morbidly obese Abdomen: soft, non-tender; bowel sounds normal; no masses,  no organomegaly Psych:  normal mood and affect. Racing thoughts, normal speech, labile affect.  Neurologic: Grossly normal   Assessment:   1. PTSD (post-traumatic stress disorder)   2. Encounter for weight management   3. Prediabetes   4. Morbid obesity with BMI of 50.0-59.9, adult [facility])     Plan:   Patient believes that she needs intervention for her PTSD as her symptoms have worsened this month. Currently on Cymbalta for her h/o depression, however also utilizing for some degree of pain management.  Discussed treatment options of management of her depression, including medication, counseling (which patient is very hesitant about), or EMDR.  Given info on EMDR to read over, however patient desires to start additional medication. Has utilized Paxil in the past but notes it did not agree with her. Will attempt to use Zoloft (less weight gain associated, vs Prozac).  Prescribed Zoloft 50 mg for PTSD.  Weight management - patient continuing to work on diet. Less weight loss noted this visit as  patient has been less physically active due to joint pain. Still managed to lose 1 lb.  Again approached subject of utilization of weight loss medication to aid with weight loss. Patient still hesitant for now, may be willing to consider next visit.  Prediabetes  - patient notes she is working to change her diet. Last A1c was 6.1. Can recheck next visit.    A total of 30 minutes were spent face-to-face with the patient during this encounter and over half of that time dealt with counseling and coordination of care.   Hildred Laser, MD [facility]

## 2021-06-24 NOTE — Patient Instructions (Signed)
Managing Post-Traumatic Stress Disorder If you have been diagnosed with post-traumatic stress disorder (PTSD), you may be relieved that you now know why you have felt or behaved a certain way. Still, you may feel overwhelmed about the treatment ahead. You may also wonderhow to get the support you need and how to deal with the condition day-to-day. If you are living with PTSD, there are ways to help you recover from it andmanage your symptoms. How to manage lifestyle changes Managing stress Stress is your body's reaction to life changes and events, both good and bad. Stress can make PTSD worse. Take the following steps to manage stress: Talk with your health care provider or a counselor if you would like to learn more about techniques to reduce your stress. He or she may suggest some stress reduction techniques such as: Muscle relaxation exercises. Regular exercise. Meditation, yoga, or other mind-body exercises. Breathing exercises. Listening to quiet music. Spending time outside. Maintain a healthy lifestyle. Eat a healthy diet, exercise regularly, get plenty of sleep, and take time to relax. Spend time with others. Talk with them about how you are feeling and what kind of support you need. Try not to isolate yourself, even though you may feel like doing that. Isolating yourself can delay your recovery. Do activities and hobbies that you enjoy. Pace yourself when doing stressful things. Take breaks, and reward yourself when you finish. Make sure that you do not overload your schedule.  Medicines Your health care provider may suggest certain medicines if he or she feels that they will help to improve your condition. Medicines for depression (antidepressants) or severe loss of contact with reality (antipsychotics) may be used to treat PTSD. Avoid using alcohol and other substances that may prevent your medicines from working properly. It is also important to: Talk with your pharmacist or health  care provider about all medicines that you take, their possible side effects, and which medicines are safe to take together. Make it your goal to take part in all treatment decisions (shared decision-making). Ask about possible side effects of medicines that your health care provider recommends, and tell him or her how you feel about having those side effects. It is best if shared decision-making with your health care provider is part of your total treatment plan. If your health care provider prescribes a medicine, you may not notice the full benefits of it for 4-8 weeks. Most people who are treated for PTSD need to takemedicine for at least 6-12 months before they feel better. If you are taking medicines as part of your treatment, do not stop taking medicines before you ask your health care provider if it is safe to stop. You may need to have the medicine slowly decreased (tapered) over time to lower the risk of harmful side effects. Relationships Many people who have PTSD have difficulty trusting others. Make an effort to: Take risks and develop trust with close friends and family members. Developing trust in others can help you feel safe and connect you with emotional support. Be open and honest about your feelings. Have fun and relax in safe spaces, such as with friends and family. Think about going to couples counseling, family education classes, or family therapy. Your loved ones may not always know how to be supportive. Therapy can be helpful for everyone. How to recognize changes in your condition Be aware of your symptoms and how often you have them. The following symptoms mean that you need to seek help for your PTSD:   You feel suspicious and angry. You have repeated flashbacks. You avoid going out or being with others. You have an increasing number of fights with close friends or family members, such as your spouse. You have thoughts about hurting yourself or others. You cannot get relief  from feelings of depression or anxiety. Follow these instructions at home: Lifestyle Exercise regularly. Try to do 30 or more minutes of physical activity on most days of the week. Try to get 7-9 hours of sleep each night. To help with sleep: Keep your bedroom cool and dark. Avoid screen time before bedtime. This means avoiding use of your TV, computer, tablet, and cell phone. Practice self-soothing skills and use them daily. Try to have fun and seek humor in your life. Eating and drinking Do not eat a heavy meal during the hour before you go to bed. Do not drink alcohol or caffeinated drinks before bed. Avoid using alcohol or drugs. General instructions If your PTSD is affecting your marriage or family, seek help from a family therapist. Remind yourself that recovering from the trauma is a process and takes time. Take over-the-counter and prescription medicines only as told by your health care provider. Make sure to let all of your health care providers know that you have PTSD. This is especially important if you are having surgery or need to be admitted to the hospital. Keep all follow-up visits as told by your health care providers. This is important. Where to find support Talking to others Explain that PTSD is a mental health problem. It is something that a person can develop after experiencing or seeing a life-threatening event. Tell them that PTSD makes you feel stress like you did during the event. Talk to your loved ones about the symptoms you have. Also tell them what things or situations can cause symptoms to start (are triggers for you). Assure your loved ones that there are treatments to help PTSD. Discuss possibly seeking family therapy or couples therapy. If you are worried or fearful about seeking treatment, ask for support. Keep daily contact with at least one trusted friend or family member. Finances Not all insurance plans cover mental health care, so it is important to  check with your insurance carrier. If paying for co-pays or counseling services is a problem, search for a local or county mental health care center. Public mental health care services may be offered there at a low cost or no cost when you are not able to see a private health care provider. If you are a veteran, contact alocal veterans organization or veterans hospital for more information. If you are taking medicine for PTSD, you may be able to get the genericform, which may be less expensive than brand-name medicine. Some makers of prescription medicines also offer help to patients who cannot afford themedicines that they need. Therapy and support groups Find a support group in your community. Often, groups are available for military veterans, trauma victims, and family members or caregivers. Look into volunteer opportunities. Taking part in these can help you feel more connected to your community. Contact a local organization to find out if you are eligible for a service dog. Where to find more information Go to this website to find more information about PTSD, treatment of PTSD, and how to get support: National Center for PTSD: www.ptsd.va.gov Contact a health care provider if: Your symptoms get worse or do not get better. Get help right away if: You have thoughts about hurting yourself or others. If   you ever feel like you may hurt yourself or others, or have thoughts about taking your own life, get help right away. You can go to your nearest emergency department or call: Your local emergency services (911 in the [place] A suicide crisis helpline, such as the National Suicide Prevention Lifeline at 1-800-273-8255. This is open 24-hours a day. Summary If you are living with PTSD, there are ways to help you recover from it and manage your symptoms. Find supportive environments and people who understand PTSD. Spend time in those places, and maintain contact with those people. Work with your health  care team to create a plan for managing PTSD. The plan should include counseling, stress reduction techniques, and healthy lifestyle habits. This information is not intended to replace advice given to you by your health care provider. Make sure you discuss any questions you have with your healthcare provider. Document Revised: 08/14/2020 Document Reviewed: 08/14/2020 Elsevier Patient Education  2022 Elsevier Inc.  

## 2021-06-24 NOTE — Progress Notes (Signed)
Pt present for follow up for weight management and PTSD. Pt stated that she was doing well and the rashes are doing better.

## 2021-06-25 ENCOUNTER — Encounter: Payer: 59 | Admitting: Obstetrics and Gynecology

## 2021-06-29 DIAGNOSIS — R7303 Prediabetes: Secondary | ICD-10-CM | POA: Insufficient documentation

## 2021-06-30 ENCOUNTER — Other Ambulatory Visit: Payer: Self-pay | Admitting: Family Medicine

## 2021-06-30 DIAGNOSIS — M159 Polyosteoarthritis, unspecified: Secondary | ICD-10-CM

## 2021-06-30 DIAGNOSIS — G629 Polyneuropathy, unspecified: Secondary | ICD-10-CM

## 2021-06-30 DIAGNOSIS — M8949 Other hypertrophic osteoarthropathy, multiple sites: Secondary | ICD-10-CM

## 2021-07-05 ENCOUNTER — Telehealth: Payer: Self-pay | Admitting: Obstetrics and Gynecology

## 2021-07-05 NOTE — Telephone Encounter (Signed)
Elizabeth Wyatt wanted [id number] and Dr. Valentino Saxon aware that she is having knee surgery on August 15th.

## 2021-07-06 NOTE — Telephone Encounter (Signed)
FYI

## 2021-07-09 ENCOUNTER — Encounter: Payer: Self-pay | Admitting: Cardiovascular Disease

## 2021-07-09 ENCOUNTER — Ambulatory Visit (INDEPENDENT_AMBULATORY_CARE_PROVIDER_SITE_OTHER): Payer: 59 | Admitting: Cardiovascular Disease

## 2021-07-09 ENCOUNTER — Other Ambulatory Visit: Payer: Self-pay

## 2021-07-09 VITALS — BP 120/70 | HR 75 | Ht <= 58 in | Wt 262.0 lb

## 2021-07-09 DIAGNOSIS — R079 Chest pain, unspecified: Secondary | ICD-10-CM | POA: Diagnosis not present

## 2021-07-09 DIAGNOSIS — E782 Mixed hyperlipidemia: Secondary | ICD-10-CM | POA: Diagnosis not present

## 2021-07-09 DIAGNOSIS — F332 Major depressive disorder, recurrent severe without psychotic features: Secondary | ICD-10-CM | POA: Diagnosis not present

## 2021-07-09 DIAGNOSIS — I10 Essential (primary) hypertension: Secondary | ICD-10-CM | POA: Diagnosis not present

## 2021-07-09 NOTE — Progress Notes (Signed)
Date:  07/09/2021   ID:  Elizabeth Wyatt, DOB 1956/02/04, MRN [id number]  Patient Location:  [address] [place] [address]   Provider location:   [facility], [place] office  PCP:  Marguarite Arbour, MD  Cardiologist:  [facility]bard Robinson [facility]   Chief Complaint  Patient presents with   Follow-up    Cardiac clearance for ARTHROSCOPY KNEE surgery. "Doing well."  Medications reviewed by the patient verbally.            History of Present Illness:    Elizabeth Wyatt is a 65 y.o. female  past medical history of morbid obesity,  hypertension,  hyperlipidemia,  chronic chest pain  cardiac catheterization May 2009   no significant coronary artery disease (circumflex coming off the ostium of the RCA)  significant stress at home, son who has autism. Separated from her husband Who presents for routine followup of her chest pain symptoms.   Reports having chronic Knee pain 3 years, difficulty walking, ice packs Preop knee surgery, left Aug 15th, Dr. Ernest Pine Needs menicus repair, arthroscopic repair  MRI: knee DJD  Stress at home, PTSD issues, Weight up Takes care of [name], son  Onalee Hua: ex husband:  Causing stress  EKG personally reviewed by myself on todays visit NSR rate 75 poor poor R wave progression  Other past medical history reviewed Previous episode of paralysis, aphasia approximately one month ago. Woke up, could not move, could not open her eyes, could not speak. No focal deficits. She reports that her per minute was working but she could not communicate. After several hours was able to call for help   Reports having similar symptoms many years ago and was kept in the hospital at that time. 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    CT scan of the head showed no pathology She is scheduled to have carotid ultrasound, possibly MRI, follow-up with neurology   Stress test 04/2008:   Cardiac catheter at the same time   Past Medical History:  Diagnosis Date   Allergy    Asthma    Broken rib    CAD (coronary artery disease)    Family history of malignant neoplasm of breast    HLD (hyperlipidemia)    Obesity    Osteoarthritis    Personal history of colonic polyps    Personal history of tobacco use, presenting hazards to health    Prediabetes    Special screening for malignant neoplasms, colon    TIA (transient ischemic attack)    Past Surgical History:  Procedure Laterality Date   BREAST BIOPSY Right 2009   BREAST BIOPSY Right 04/2018   ductal hyperplasia, cystic and papillary metaplasia, microscopic intraducal papilloma, sclerosing adenosis   BREAST MASS EXCISION Right 2009   CARDIAC CATHETERIZATION  2010   Dr. Mariah Milling with [place]   COLONOSCOPY  2008, 2014   [facility]   DILATION AND CURETTAGE OF UTERUS  2012   MOUTH SURGERY     MOUTH SURGERY     UTERINE FIBROID SURGERY  2012      Allergies:   Diphenhydramine hcl, Guaifenesin, Latex, Montelukast sodium, Pamabrom, Simvastatin, Sulfonamide derivatives, and Wellbutrin [bupropion]   Social History   Tobacco Use   Smoking status: Former    Packs/day: 3.00    Years: 3.00    Pack years: 9.00    Types: Cigarettes    Quit date: 1993  Years since quitting: 29.5   Smokeless tobacco: Former   Tobacco comments:    tobacco use - no   Vaping Use   Vaping Use: Never used  Substance Use Topics   Alcohol use: No    Comment: quit drinking years ago, used to drink on weekends   Drug use: No     Current Outpatient Medications on File Prior to Visit  Medication Sig Dispense Refill   acetaminophen (TYLENOL) 500 MG tablet Take 500 mg by mouth every 6 (six) hours as needed.     albuterol (PROAIR HFA) 108 (90 Base) MCG/ACT inhaler Inhale 2 puffs into the lungs every 4 (four) hours as needed for wheezing or shortness of breath. 8.5 Inhaler 3   aspirin EC 81 MG tablet Take 1 tablet (81 mg total) by mouth daily.      atorvastatin (LIPITOR) 10 MG tablet Take 1 tablet (10 mg total) by mouth daily. 90 tablet 0   cetirizine (ZYRTEC) 10 MG tablet Take 10 mg by mouth as needed.     diclofenac Sodium (VOLTAREN) 1 % GEL Apply 2 g topically 3 (three) times daily as needed. 100 g 2   DULoxetine (CYMBALTA) 60 MG capsule Take 60 mg by mouth 2 (two) times daily.     fluconazole (DIFLUCAN) 200 MG tablet TAKE 1 TABLET (200 MG TOTAL) BY MOUTH ONCE A WEEK. FOR MAINTENANCE 4 tablet 11   fluticasone (FLOVENT HFA) 110 MCG/ACT inhaler Inhale 2 puffs into the lungs 2 (two) times a day. 12 g 5   gabapentin (NEURONTIN) 100 MG capsule Take 3 capsules (300 mg total) by mouth at bedtime. 270 capsule 0   hydrOXYzine (ATARAX/VISTARIL) 25 MG tablet Take 1 tablet (25 mg total) by mouth every 8 (eight) hours as needed for anxiety. 30 tablet 2   levothyroxine (SYNTHROID) 50 MCG tablet Take 1 tablet (50 mcg total) by mouth daily. 90 tablet 0   meclizine (ANTIVERT) 25 MG tablet Take 1 tablet (25 mg total) by mouth 3 (three) times daily as needed. 60 tablet 1   Naproxen Sodium (ALEVE PO) Take by mouth.     nebivolol (BYSTOLIC) 10 MG tablet Take 1 tablet (10 mg total) by mouth daily. Take one tab by mouth daily 90 tablet 3   nitroGLYCERIN (NITROSTAT) 0.4 MG SL tablet Place 1 tablet (0.4 mg total) under the tongue every 5 (five) minutes as needed. 45 tablet 3   nystatin (MYCOSTATIN/NYSTOP) powder ONE APPLICATION TWICE A DAY TO RASH FOR UP TO 1-2 WEEKS AS NEEDED 45 g 2   omeprazole (PRILOSEC) 20 MG capsule Take 1 capsule (20 mg total) by mouth daily before breakfast. 90 capsule 0   sertraline (ZOLOFT) 50 MG tablet Take 1 tablet (50 mg total) by mouth daily. 90 tablet 3   topiramate (TOPAMAX) 50 MG tablet TAKE 1/2 TABLET BY MOUTH DAILY AS DIRECTED 15 tablet 12   triamcinolone cream (KENALOG) 0.1 % Apply 1 application topically daily as needed.  1   verapamil (VERELAN PM) 240 MG 24 hr capsule TAKE 1 CAPSULE BY MOUTH EVERYDAY AT BEDTIME 90 capsule 0    DULoxetine (CYMBALTA) 30 MG capsule Take 3 capsules (90 mg total) by mouth daily. (Patient not taking: Reported on 07/09/2021) 90 capsule 1   No current facility-administered medications on file prior to visit.     Family Hx: The patient's family history includes Breast cancer (age of onset: 27) in her mother; Cancer in her maternal grandmother and mother.  ROS:   Please  see the history of present illness.    Review of Systems  Constitutional: Negative.   HENT: Negative.    Respiratory: Negative.    Cardiovascular: Negative.   Gastrointestinal: Negative.   Musculoskeletal:  Positive for joint pain.  Neurological: Negative.   Psychiatric/Behavioral: Negative.    All other systems reviewed and are negative.   Labs/Other Tests and Data Reviewed:    Recent Labs: No results found for requested labs within last 8760 hours.   Recent Lipid Panel Lab Results  Component Value Date/Time   CHOL 164 04/15/2016 12:00 PM   TRIG 128 04/15/2016 12:00 PM   HDL 94 04/15/2016 12:00 PM   CHOLHDL 1.7 04/15/2016 12:00 PM   CHOLHDL 3 10/24/2013 08:56 AM   LDLCALC 44 04/15/2016 12:00 PM   LDLDIRECT 123.0 10/24/2013 08:56 AM    Wt Readings from Last 3 Encounters:  07/09/21 262 lb (118.8 kg)  06/24/21 259 lb (117.5 kg)  05/25/21 260 lb 14.4 oz (118.3 kg)     Exam:    BP 120/70 (BP Location: Left Arm, Patient Position: Sitting, Cuff Size: Large)   Pulse 75   Ht 4\' 8"  (1.422 m)   Wt 262 lb (118.8 kg)   SpO2 98%   BMI 58.74 kg/m  Constitutional:  oriented to person, place, and time. No distress.  Presents in a wheelchairPresents in wheel chair HENT:  Head: Grossly normal Eyes:  no discharge. No scleral icterus.  Neck: No JVD, no carotid bruits  Cardiovascular: Regular rate and rhythm, no murmurs appreciated Pulmonary/Chest: Clear to auscultation bilaterally, no wheezes or rails Abdominal: Soft.  no distension.  no tenderness.  Musculoskeletal: Normal range of motion Neurological:   normal muscle tone. Coordination normal. No atrophy Skin: Skin warm and dry Psychiatric: normal affect, pleasant   ASSESSMENT & PLAN:    Problem List Items Addressed This Visit       Cardiology Problems   Hyperlipidemia     Other   Major depressive disorder, recurrent, severe without psychotic features (HCC)   Relevant Medications   DULoxetine (CYMBALTA) 60 MG capsule   Other Visit Diagnoses     Chest pain of uncertain etiology    -  Primary   Essential hypertension       Morbid obesity (HCC)         Preoperative cardiovascular evaluation No active cardiac issues, in fact no significant prior cardiac history No further cardiac work-up needed, acceptable risk for procedure  transient cerebral ischemias - aspirin 81 mg daily No episodes   HYPERTENSION, BENIGN -  Blood pressure is well controlled on today's visit. No changes made to the medications.  Hyperlipidemia No recent labs available   Atypical chest pain Stable, cath clean, no further workup   Morbid obesity due to excess calories (HCC) Eating more secondary to stress We have encouraged continued exercise, careful diet management in an effort to lose weight.   ADJ DISORDER WITH MIXED ANXIETY & DEPRESSED MOOD continued  stress at home,  separated from her husband, disabled child Financial stressors   Total encounter time more than 25 minutes  Greater than 50% was spent in counseling and coordination of care with the patient     Signed, , MD  [facility] [address], [place], [place]

## 2021-07-09 NOTE — Patient Instructions (Addendum)
Medication Instructions:  No changes  If you need a refill on your cardiac medications before your next appointment, please call your pharmacy.    Lab work: No new labs needed   If you have labs (blood work) drawn today and your tests are completely normal, you will receive your results only by: MyChart Message (if you have MyChart) OR A paper copy in the mail If you have any lab test that is abnormal or we need to change your treatment, we will call you to review the results.   Testing/Procedures: No new testing needed   Follow-Up: At [facility], you and your health needs are our priority.  As part of our continuing mission to provide you with exceptional heart care, we have created designated Provider Care Teams.  These Care Teams include your primary Cardiologist (physician) and Advanced Practice Providers (APPs -  Physician Assistants and Nurse Practitioners) who all work together to provide you with the care you need, when you need it.  You will need a follow up appointment in 12 months  Providers on your designated Care Team:   Christopher Berge, NP Ryan Dunn, PA-C Jacquelyn Visser, PA-C Cadence Furth, PA-C  Any Other Special Instructions Will Be Listed Below (If Applicable).  COVID-19 Vaccine Information can be found at: [email or web address] For questions related to vaccine distribution or appointments, please email [email or web address] or call [phone]    

## 2021-07-11 NOTE — Discharge Instructions (Addendum)
Instructions after Knee Arthroscopy    James P. Hooten, Jr., M.D.     Dept. of Orthopaedics & Sports Medicine  [facility]  [address]  [place], [place]  [address]   Phone: [phone]   Fax: [phone]   DIET: Drink plenty of non-alcoholic fluids & begin a light diet. Resume your normal diet the day after surgery.  ACTIVITY:  You may use crutches or a walker with weight-bearing as tolerated, unless instructed otherwise. You may wean yourself off of the walker or crutches as tolerated.  Begin doing gentle exercises. Exercising will reduce the pain and swelling, increase motion, and prevent muscle weakness.   Avoid strenuous activities or athletics for a minimum of 4-6 weeks after arthroscopic surgery. Do not drive or operate any equipment until instructed.  WOUND CARE:  Place one to two pillows under the knee the first day or two when sitting or lying.  Continue to use the ice packs periodically to reduce pain and swelling. The small incisions in your knee are closed with nylon stitches. The stitches will be removed in the office. The bulky dressing may be removed on the second day after surgery. DO NOT TOUCH THE STITCHES. Put a Band-Aid over each stitch. Do NOT use any ointments or creams on the incisions.  You may bathe or shower after the stitches are removed at the first office visit following surgery.  MEDICATIONS: You may resume your regular medications. Please take the pain medication as prescribed. Do not take pain medication on an empty stomach. Do not drive or drink alcoholic beverages when taking pain medications.  CALL THE OFFICE FOR: Temperature above 101 degrees Excessive bleeding or drainage on the dressing. Excessive swelling, coldness, or paleness of the toes. Persistent nausea and vomiting.  FOLLOW-UP:  You should have an appointment to return to the office in 7-10 days after surgery.      [facility] Department Directory          [email or web address]       [email or web address]          Cardiology  Appointments: [place] - [phone] [place] - [phone]  Endocrinology  Appointments: [place] - [phone] [place] - [phone]  Gastroenterology  Appointments: [place] - [phone] [place] - [phone]        General Surgery   Appointments: [place] - [phone]  Internal Medicine/Family Medicine  Appointments: [place] - [phone] [place] - [phone] [place] - [phone]  Metabolic and Weigh Loss Surgery  Appointments: [place] - [phone]        Neurology  Appointments: [place] - [phone] [place] - [phone]  Neurosurgery  Appointments: [place] - [phone]  Obstetrics & Gynecology  Appointments: [place] - [phone] [place] - [phone]        Pediatrics  Appointments: [place] - [phone] [place] - [phone]  Physiatry  Appointments: [place] -[phone]  Physical Therapy  Appointments: [place] - [phone] [place] - [phone]        Podiatry  Appointments: [place] - [phone] [place] - [phone]  Pulmonology  Appointments: [place] - [phone]  Rheumatology  Appointments: [place] - [phone]        [place] Location: [facility]  [address] [place], [place]  [address]  [place] Location: [facility] [address] [place], [place]  [address]  [place] Location: [facility] [address] [place],   [address]   AMBULATORY SURGERY  DISCHARGE INSTRUCTIONS   The drugs that you were given will stay in your system until tomorrow so for the next 24 hours you should   not:  Drive an automobile Make any legal decisions Drink any alcoholic beverage   You may resume regular meals tomorrow.  Today it is better to start with liquids and gradually work up to solid foods.  You may eat anything you prefer, but it is better to start  with liquids, then soup and crackers, and gradually work up to solid foods.   Please notify your doctor immediately if you have any unusual bleeding, trouble breathing, redness and pain at the surgery site, drainage, fever, or pain not relieved by medication.    Your post-operative visit with Dr.                                       is: Date:                        Time:    Please call to schedule your post-operative visit.  Additional Instructions: 

## 2021-07-16 ENCOUNTER — Encounter
Admission: RE | Admit: 2021-07-16 | Discharge: 2021-07-16 | Disposition: A | Discharge status: Home / Self Care | Payer: 59 | Source: Ambulatory Visit | Attending: Orthopedic Surgery | Admitting: Orthopedic Surgery

## 2021-07-16 ENCOUNTER — Other Ambulatory Visit: Payer: Self-pay

## 2021-07-16 HISTORY — DX: Essential (primary) hypertension: I10

## 2021-07-16 HISTORY — DX: Embolism following incomplete spontaneous abortion: O03.2

## 2021-07-16 HISTORY — DX: Gastro-esophageal reflux disease without esophagitis: K21.9

## 2021-07-16 HISTORY — DX: Prediabetes: R73.03

## 2021-07-16 HISTORY — DX: Cerebral infarction, unspecified: I63.9

## 2021-07-16 NOTE — Patient Instructions (Addendum)
Your procedure is scheduled on: 07/26/21 Report to DAY SURGERY DEPARTMENT LOCATED ON 2ND FLOOR MEDICAL MALL ENTRANCE. To find out your arrival time please call [phone] between 1PM - 3PM on 07/23/21.  Remember: Instructions that are not followed completely may result in serious medical risk, up to and including death, or upon the discretion of your surgeon and anesthesiologist your surgery may need to be rescheduled.     _X__ 1. Do not eat food after midnight the night before your procedure.                 No gum chewing or hard candies. You may drink clear liquids up to 2 hours                 before you are scheduled to arrive for your surgery- DO not drink clear                 liquids within 2 hours of the start of your surgery.                 Clear Liquids include:  water, apple juice without pulp, clear carbohydrate                 drink such as Clearfast or Gatorade, Black Coffee or Tea (Do not add                 anything to coffee or tea). Diabetics water only  __X__2.  On the morning of surgery brush your teeth with toothpaste and water, you                 may rinse your mouth with mouthwash if you wish.  Do not swallow any              toothpaste of mouthwash.     _X__ 3.  No Alcohol for 24 hours before or after surgery.   _X__ 4.  Do Not Smoke or use e-cigarettes For 24 Hours Prior to Your Surgery.                 Do not use any chewable tobacco products for at least 6 hours prior to                 surgery.  ____  5.  Bring all medications with you on the day of surgery if instructed.   __X__  6.  Notify your doctor if there is any change in your medical condition      (cold, fever, infections).     Do not wear jewelry, make-up, hairpins, clips or nail polish. Do not wear lotions, powders, or perfumes.  Do not shave 48 hours prior to surgery. Men may shave face and neck. Do not bring valuables to the hospital.    [facility] is not responsible for any belongings or  valuables.  Contacts, dentures/partials or body piercings may not be worn into surgery. Bring a case for your contacts, glasses or hearing aids, a denture cup will be supplied. Leave your suitcase in the car. After surgery it may be brought to your room. For patients admitted to the hospital, discharge time is determined by your treatment team.   Patients discharged the day of surgery will not be allowed to drive home.   Please read over the following fact sheets that you were given:   Ensure pre-surgery drink, CHG soap  __X__ Take these medicines the morning of surgery with A SIP OF  WATER:    1. DULoxetine (CYMBALTA) 60 MG capsule  2. levothyroxine (SYNTHROID) 50 MCG tablet  3. omeprazole (PRILOSEC) 20 MG capsule (TAKE AT BEDTIME AS USUAL AND MORNING OF SURGERY)  4.  5.  6.  ____ Fleet Enema (as directed)   __X__ Use CHG Soap/SAGE wipes as directed  __X__ Use inhalers on the day of surgery  ____ Stop metformin/Janumet/Farxiga 2 days prior to surgery    ____ Take 1/2 of usual insulin dose the night before surgery. No insulin the morning          of surgery.   ____ Stop Blood Thinners Coumadin/Plavix/Xarelto/Pleta/Pradaxa/Eliquis/Effient/Aspirin  on   Or contact your Surgeon, Cardiologist or Medical Doctor regarding  ability to stop your blood thinners  __X__ Stop Anti-inflammatories 7 days before surgery such as Advil, Ibuprofen, Motrin,  BC or Goodies Powder, Naprosyn, Naproxen, Aleve, Aspirin    __X__ Stop all herbal supplements, fish oil or vitamin E until after surgery.    ____ Bring C-Pap to the hospital.   Dr Ernest Pine is OK with continuing your 81 mg Aspirin thru 07/25/21 and hold the day of surgery 07/26/21.

## 2021-07-22 ENCOUNTER — Other Ambulatory Visit: Payer: Self-pay | Admitting: Family Medicine

## 2021-07-22 ENCOUNTER — Encounter: Payer: Self-pay | Admitting: Urgent Care

## 2021-07-22 ENCOUNTER — Other Ambulatory Visit: Payer: Self-pay

## 2021-07-22 ENCOUNTER — Other Ambulatory Visit
Admission: RE | Admit: 2021-07-22 | Discharge: 2021-07-22 | Disposition: A | Discharge status: Home / Self Care | Payer: 59 | Source: Ambulatory Visit | Attending: Orthopedic Surgery | Admitting: Orthopedic Surgery

## 2021-07-22 DIAGNOSIS — M159 Polyosteoarthritis, unspecified: Secondary | ICD-10-CM

## 2021-07-22 DIAGNOSIS — Z01812 Encounter for preprocedural laboratory examination: Secondary | ICD-10-CM | POA: Insufficient documentation

## 2021-07-22 DIAGNOSIS — M8949 Other hypertrophic osteoarthropathy, multiple sites: Secondary | ICD-10-CM

## 2021-07-22 DIAGNOSIS — G629 Polyneuropathy, unspecified: Secondary | ICD-10-CM

## 2021-07-22 DIAGNOSIS — R4182 Altered mental status, unspecified: Secondary | ICD-10-CM

## 2021-07-22 HISTORY — DX: Dyspnea, unspecified: R06.00

## 2021-07-22 HISTORY — DX: Post-traumatic stress disorder, unspecified: F43.10

## 2021-07-22 HISTORY — DX: Other forms of dyspnea: R06.09

## 2021-07-22 HISTORY — DX: Reaction to severe stress, unspecified: F43.9

## 2021-07-22 LAB — BASIC METABOLIC PANEL
Anion gap: 9 (ref 5–15)
BUN: 20 mg/dL (ref 8–23)
CO2: 25 mmol/L (ref 22–32)
Calcium: 9.3 mg/dL (ref 8.9–10.3)
Chloride: 103 mmol/L (ref 98–111)
Creatinine, Ser: 0.89 mg/dL (ref 0.44–1.00)
GFR, Estimated: >60 mL/min (ref >60)
Glucose, Bld: 97 mg/dL (ref 70–99)
Potassium: 4 mmol/L (ref 3.5–5.1)
Sodium: 137 mmol/L (ref 135–145)

## 2021-07-26 ENCOUNTER — Ambulatory Visit: Payer: 59 | Admitting: Anesthesiology

## 2021-07-26 ENCOUNTER — Ambulatory Visit
Admission: RE | Admit: 2021-07-26 | Discharge: 2021-07-26 | Disposition: A | Discharge status: Home / Self Care | Payer: 59 | Source: Ambulatory Visit | Attending: Orthopedic Surgery | Admitting: Orthopedic Surgery

## 2021-07-26 ENCOUNTER — Encounter
Admission: RE | Disposition: A | Discharge status: Home / Self Care | Payer: Self-pay | Source: Ambulatory Visit | Attending: Orthopedic Surgery

## 2021-07-26 ENCOUNTER — Encounter: Payer: Self-pay | Admitting: Orthopedic Surgery

## 2021-07-26 ENCOUNTER — Other Ambulatory Visit: Payer: Self-pay

## 2021-07-26 DIAGNOSIS — M94262 Chondromalacia, left knee: Secondary | ICD-10-CM | POA: Diagnosis not present

## 2021-07-26 DIAGNOSIS — Z79890 Hormone replacement therapy (postmenopausal): Secondary | ICD-10-CM | POA: Diagnosis not present

## 2021-07-26 DIAGNOSIS — Z79899 Other long term (current) drug therapy: Secondary | ICD-10-CM | POA: Insufficient documentation

## 2021-07-26 DIAGNOSIS — S83272A Complex tear of lateral meniscus, current injury, left knee, initial encounter: Secondary | ICD-10-CM | POA: Diagnosis not present

## 2021-07-26 DIAGNOSIS — Z6841 Body Mass Index (BMI) 40.0 and over, adult: Secondary | ICD-10-CM | POA: Diagnosis not present

## 2021-07-26 DIAGNOSIS — X58XXXA Exposure to other specified factors, initial encounter: Secondary | ICD-10-CM | POA: Insufficient documentation

## 2021-07-26 DIAGNOSIS — Z8673 Personal history of transient ischemic attack (TIA), and cerebral infarction without residual deficits: Secondary | ICD-10-CM | POA: Diagnosis not present

## 2021-07-26 DIAGNOSIS — Z7982 Long term (current) use of aspirin: Secondary | ICD-10-CM | POA: Diagnosis not present

## 2021-07-26 DIAGNOSIS — Z882 Allergy status to sulfonamides status: Secondary | ICD-10-CM | POA: Diagnosis not present

## 2021-07-26 DIAGNOSIS — Z7951 Long term (current) use of inhaled steroids: Secondary | ICD-10-CM | POA: Diagnosis not present

## 2021-07-26 DIAGNOSIS — Z87891 Personal history of nicotine dependence: Secondary | ICD-10-CM | POA: Insufficient documentation

## 2021-07-26 DIAGNOSIS — Z9889 Other specified postprocedural states: Secondary | ICD-10-CM

## 2021-07-26 DIAGNOSIS — Z91040 Latex allergy status: Secondary | ICD-10-CM | POA: Diagnosis not present

## 2021-07-26 DIAGNOSIS — Z888 Allergy status to other drugs, medicaments and biological substances status: Secondary | ICD-10-CM | POA: Insufficient documentation

## 2021-07-26 DIAGNOSIS — Z86718 Personal history of other venous thrombosis and embolism: Secondary | ICD-10-CM | POA: Diagnosis not present

## 2021-07-26 DIAGNOSIS — M2392 Unspecified internal derangement of left knee: Secondary | ICD-10-CM | POA: Diagnosis present

## 2021-07-26 HISTORY — PX: KNEE ARTHROSCOPY: SHX127

## 2021-07-26 LAB — GLUCOSE, CAPILLARY: Glucose-Capillary: 129 mg/dL — ABNORMAL HIGH (ref 70–99)

## 2021-07-26 SURGERY — ARTHROSCOPY, KNEE
Anesthesia: General | Site: Knee | Laterality: Left

## 2021-07-26 MED ORDER — ACETAMINOPHEN 10 MG/ML IV SOLN
INTRAVENOUS | Status: AC
  Filled 2021-07-26: qty 100

## 2021-07-26 MED ORDER — MIDAZOLAM HCL 2 MG/2ML IJ SOLN
INTRAMUSCULAR | Status: DC | PRN
  Administered 2021-07-26: 2 mg via INTRAVENOUS

## 2021-07-26 MED ORDER — FENTANYL CITRATE (PF) 100 MCG/2ML IJ SOLN
INTRAMUSCULAR | Status: DC | PRN
  Administered 2021-07-26 (×3): 50 ug via INTRAVENOUS

## 2021-07-26 MED ORDER — CEFAZOLIN SODIUM-DEXTROSE 2-3 GM-%(50ML) IV SOLR
INTRAVENOUS | Status: DC | PRN
  Administered 2021-07-26: 2 g via INTRAVENOUS

## 2021-07-26 MED ORDER — MIDAZOLAM HCL 2 MG/2ML IJ SOLN
INTRAMUSCULAR | Status: AC
  Filled 2021-07-26: qty 2

## 2021-07-26 MED ORDER — CELECOXIB 200 MG PO CAPS
ORAL_CAPSULE | ORAL | Status: AC
  Administered 2021-07-26: 400 mg via ORAL
  Filled 2021-07-26: qty 2

## 2021-07-26 MED ORDER — ONDANSETRON HCL 4 MG/2ML IJ SOLN
INTRAMUSCULAR | Status: DC | PRN
  Administered 2021-07-26: 4 mg via INTRAVENOUS

## 2021-07-26 MED ORDER — HYDROCODONE-ACETAMINOPHEN 5-325 MG PO TABS: 1.0000 | ORAL_TABLET | ORAL | 0 refills | Status: DC | PRN

## 2021-07-26 MED ORDER — MORPHINE SULFATE (PF) 4 MG/ML IV SOLN
INTRAVENOUS | Status: AC
  Filled 2021-07-26: qty 1

## 2021-07-26 MED ORDER — ONDANSETRON HCL 4 MG/2ML IJ SOLN: 4.0000 mg | Freq: Once | INTRAMUSCULAR | Status: DC | PRN

## 2021-07-26 MED ORDER — FENTANYL CITRATE (PF) 100 MCG/2ML IJ SOLN
INTRAMUSCULAR | Status: AC
  Filled 2021-07-26: qty 2

## 2021-07-26 MED ORDER — SODIUM CHLORIDE 0.9 % IV SOLN: INTRAVENOUS | Status: DC | PRN

## 2021-07-26 MED ORDER — LACTATED RINGERS IV SOLN: INTRAVENOUS | Status: DC

## 2021-07-26 MED ORDER — BUPIVACAINE-EPINEPHRINE 0.25% -1:200000 IJ SOLN
INTRAMUSCULAR | Status: DC | PRN
  Administered 2021-07-26: 25 mL
  Administered 2021-07-26: 5 mL

## 2021-07-26 MED ORDER — SODIUM CHLORIDE 0.9 % IR SOLN
Status: DC | PRN
  Administered 2021-07-26: 3000 mL
  Administered 2021-07-26: 1000 mL
  Administered 2021-07-26: 3000 mL

## 2021-07-26 MED ORDER — 0.9 % SODIUM CHLORIDE (POUR BTL) OPTIME
TOPICAL | Status: DC | PRN
  Administered 2021-07-26: 500 mL

## 2021-07-26 MED ORDER — CHLORHEXIDINE GLUCONATE 0.12 % MT SOLN
OROMUCOSAL | Status: AC
  Administered 2021-07-26: 15 mL via OROMUCOSAL
  Filled 2021-07-26: qty 15

## 2021-07-26 MED ORDER — LIDOCAINE HCL (CARDIAC) PF 100 MG/5ML IV SOSY
PREFILLED_SYRINGE | INTRAVENOUS | Status: DC | PRN
Start: 2021-07-26 — End: 2021-07-26
  Administered 2021-07-26: 100 mg via INTRAVENOUS

## 2021-07-26 MED ORDER — CELECOXIB 200 MG PO CAPS: 400.0000 mg | ORAL_CAPSULE | Freq: Once | ORAL | Status: AC

## 2021-07-26 MED ORDER — CHLORHEXIDINE GLUCONATE 0.12 % MT SOLN: 15.0000 mL | Freq: Once | OROMUCOSAL | Status: AC

## 2021-07-26 MED ORDER — SUCCINYLCHOLINE CHLORIDE 200 MG/10ML IV SOSY
PREFILLED_SYRINGE | INTRAVENOUS | Status: DC | PRN
  Administered 2021-07-26: 120 mg via INTRAVENOUS

## 2021-07-26 MED ORDER — MORPHINE SULFATE 4 MG/ML IJ SOLN
INTRAMUSCULAR | Status: DC | PRN
  Administered 2021-07-26: 4 mg via INTRAVENOUS

## 2021-07-26 MED ORDER — PROPOFOL 10 MG/ML IV BOLUS
INTRAVENOUS | Status: DC | PRN
  Administered 2021-07-26: 170 mg via INTRAVENOUS

## 2021-07-26 MED ORDER — ORAL CARE MOUTH RINSE: 15.0000 mL | Freq: Once | OROMUCOSAL | Status: AC

## 2021-07-26 MED ORDER — FENTANYL CITRATE (PF) 100 MCG/2ML IJ SOLN: 25.0000 ug | INTRAMUSCULAR | Status: DC | PRN

## 2021-07-26 MED ORDER — ROCURONIUM BROMIDE 100 MG/10ML IV SOLN
INTRAVENOUS | Status: DC | PRN
  Administered 2021-07-26: 50 mg via INTRAVENOUS

## 2021-07-26 MED ORDER — PHENYLEPHRINE HCL (PRESSORS) 10 MG/ML IV SOLN
INTRAVENOUS | Status: DC | PRN
Start: 2021-07-26 — End: 2021-07-26
  Administered 2021-07-26 (×2): 100 ug via INTRAVENOUS

## 2021-07-26 MED ORDER — BUPIVACAINE-EPINEPHRINE (PF) 0.25% -1:200000 IJ SOLN
INTRAMUSCULAR | Status: AC
  Filled 2021-07-26: qty 30

## 2021-07-26 MED ORDER — DEXAMETHASONE SODIUM PHOSPHATE 10 MG/ML IJ SOLN
INTRAMUSCULAR | Status: DC | PRN
Start: 2021-07-26 — End: 2021-07-26
  Administered 2021-07-26: 10 mg via INTRAVENOUS

## 2021-07-26 MED ORDER — DEXMEDETOMIDINE HCL IN NACL 200 MCG/50ML IV SOLN
INTRAVENOUS | Status: DC | PRN
  Administered 2021-07-26: 4 ug via INTRAVENOUS
  Administered 2021-07-26: 8 ug via INTRAVENOUS

## 2021-07-26 SURGICAL SUPPLY — 29 items
ADAPTER IRRIG TUBE 2 SPIKE SOL (ADAPTER) ×4 IMPLANT
ADPR TBG 2 SPK PMP STRL ASCP (ADAPTER) ×2
BLADE SHAVER 4.5 DBL SERAT CV (CUTTER) IMPLANT
DRAPE ARTHRO LIMB 89X125 STRL (DRAPES) ×2 IMPLANT
DRSG DERMACEA 8X12 NADH (GAUZE/BANDAGES/DRESSINGS) ×2 IMPLANT
DURAPREP 26ML APPLICATOR (WOUND CARE) ×4 IMPLANT
GAUZE SPONGE 4X4 12PLY STRL (GAUZE/BANDAGES/DRESSINGS) ×2 IMPLANT
GLOVE SURG ENC TEXT LTX SZ7.5 (GLOVE) ×2 IMPLANT
GLOVE SURG UNDER LTX SZ8 (GLOVE) ×2 IMPLANT
GOWN STRL REUS W/ TWL LRG LVL3 (GOWN DISPOSABLE) ×2 IMPLANT
GOWN STRL REUS W/TWL LRG LVL3 (GOWN DISPOSABLE) ×4
IV NS 1000ML (IV SOLUTION) ×2
IV NS 1000ML BAXH (IV SOLUTION) IMPLANT
IV NS IRRIG 3000ML ARTHROMATIC (IV SOLUTION) ×2 IMPLANT
KIT TURNOVER KIT A (KITS) ×2 IMPLANT
MANIFOLD NEPTUNE II (INSTRUMENTS) ×4 IMPLANT
PACK ARTHROSCOPY KNEE (MISCELLANEOUS) ×2 IMPLANT
PAD CAST CTTN 4X4 STRL (SOFTGOODS) IMPLANT
PADDING CAST COTTON 4X4 STRL (SOFTGOODS) ×2
SOL PREP PVP 2OZ (MISCELLANEOUS) ×2
SOLUTION PREP PVP 2OZ (MISCELLANEOUS) ×1 IMPLANT
SPONGE T-LAP 18X18 ~~LOC~~+RFID (SPONGE) ×2 IMPLANT
STOCKINETTE BIAS CUT 6 980064 (GAUZE/BANDAGES/DRESSINGS) ×1 IMPLANT
SUT ETHILON 3-0 FS-10 30 BLK (SUTURE) ×2
SUTURE EHLN 3-0 FS-10 30 BLK (SUTURE) ×1 IMPLANT
TUBING INFLOW SET DBFLO PUMP (TUBING) ×2 IMPLANT
TUBING OUTFLOW SET DBLFO PUMP (TUBING) ×2 IMPLANT
WAND HAND CNTRL MULTIVAC 50 (MISCELLANEOUS) ×2 IMPLANT
WRAP KNEE W/COLD PACKS 25.5X14 (SOFTGOODS) ×2 IMPLANT

## 2021-07-26 NOTE — Transfer of Care (Signed)
Immediate Anesthesia Transfer of Care Note  Patient: MAHALIA DYKES  Procedure(s) Performed: ARTHROSCOPY KNEE (Left: Knee)  Patient Location: PACU  Anesthesia Type:General  Level of Consciousness: awake, alert  and oriented  Airway & Oxygen Therapy: Patient Spontanous Breathing and Patient connected to face mask oxygen  Post-op Assessment: Report given to RN and Post -op Vital signs reviewed and stable  Post vital signs: Reviewed and stable  Last Vitals:  Vitals Value Taken Time  BP 129/67 07/26/21 1754  Temp 36.4 C 07/26/21 1754  Pulse 84 07/26/21 1800  Resp 14 07/26/21 1800  SpO2 97 % 07/26/21 1800  Vitals shown include unvalidated device data.  Last Pain:  Vitals:   07/26/21 1754  PainSc: Asleep         Complications: No notable events documented.

## 2021-07-26 NOTE — Op Note (Signed)
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   ASSISTANT: none  ANESTHESIA: general  ESTIMATED BLOOD LOSS: Minimal  FLUIDS REPLACED: 900 mL of crystalloid  TOURNIQUET TIME: Not used  INDICATIONS FOR SURGERY: Elizabeth Wyatt is a 65 y.o. year old female who has been seen for complaints of left knee pain. MRI demonstrated findings consistent with meniscal pathology. After discussion of the risks and benefits of surgical intervention, the patient expressed understanding of the risks benefits and agree with plans for left knee arthroscopy.   PROCEDURE IN DETAIL: The patient was brought into the operating room and, after adequate general anesthesia was achieved, a tourniquet was applied to the left thigh and the leg was placed in the leg holder. All bony prominences were well padded. The patient's left knee was cleaned and prepped with alcohol and Duraprep and draped in the usual sterile fashion. A "timeout" was performed as per usual protocol. The anticipated portal sites were injected with 0.25% Marcaine with epinephrine. An anterolateral incision was made and a cannula was inserted. A medium effusion was evacuated and the knee was distended with fluid using the pump. The scope was advanced down the medial gutter into the medial compartment. Under visualization with the scope, an anteromedial portal was created and a hooked probe was inserted. The medial meniscus was visualized and probed.  The medial meniscus was intact without gross tear and only mild  fraying.  The articular cartilage was visualized.  There was a focal area of grade 2 to early grade III chondromalacia involving the medial femoral condyle.  This area was debrided and contoured using the 50 degree ArthroCare wand.  The scope was then advanced into the intercondylar notch. The anterior cruciate ligament was visualized and probed and felt to be intact. The scope was removed from the lateral portal and reinserted via the anteromedial portal to better visualize the lateral compartment. The lateral meniscus was visualized and probed.  Complex tears were noted to both the anterior and posterior horn of the lateral meniscus.  The tears were debrided using meniscal punches and a 4.5 mm incisor shaver.  Final contouring was performed using the 50 degree ArthroCare wand.  The remaining rim of meniscus was probed and felt to be stable.  The articular cartilage of the lateral compartment was visualized.  There were grade 2-3 changes of chondromalacia involving the lateral femoral condyle and lateral tibial plateau.  These areas were debrided using the ArthroCare wand.  Finally, the scope was advanced so as to visualize the patellofemoral articulation. Good patellar tracking was appreciated.  Grade III chondromalacia was noted to the intercondylar sulcus.  The area was debrided and contoured using the ArthroCare wand.  The knee was irrigated with copius amounts of fluid and suctioned dry. The anterolateral portal was re-approximated with #3-0 nylon. A combination of 0.25% Marcaine with epinephrine and 4 mg of Morphine were injected via the scope. The scope was removed and the anteromedial portal was re-approximated with #3-0 nylon. A sterile dressing was applied followed by application of an ice wrap.  The patient  tolerated the procedure well and was transported to the PACU in stable condition.  Elizabeth Wyatt., M.D.

## 2021-07-26 NOTE — H&P (Signed)
The patient has been re-examined, and the chart reviewed, and there have been no interval changes to the documented history and physical.    The risks, benefits, and alternatives have been discussed at length. The patient expressed understanding of the risks benefits and agreed with plans for surgical intervention.  Rahsaan Weakland P. Osmany Azer, Jr. M.D.    

## 2021-07-26 NOTE — Anesthesia Preprocedure Evaluation (Addendum)
Anesthesia Evaluation  Patient identified by MRN, date of birth, ID band Patient awake    Reviewed: Allergy & Precautions, H&P , NPO status , Patient's Chart, lab work & pertinent test results, reviewed documented beta blocker date and time   Airway Mallampati: III  TM Distance: <3 FB Neck ROM: full    Dental  (+) Teeth Intact   Pulmonary neg pulmonary ROS, shortness of breath and with exertion, asthma , former smoker,    Pulmonary exam normal        Cardiovascular Exercise Tolerance: Poor hypertension, Pt. on medications + CAD, + Past MI and + DOE  negative cardio ROS Normal cardiovascular exam Rhythm:regular Rate:Normal  Cardiology visit 06/2021 with no further work-up needed.  Pt with atypical chest pain.   Neuro/Psych  Headaches, PSYCHIATRIC DISORDERS Anxiety Depression TIACVA, No Residual Symptoms negative neurological ROS  negative psych ROS   GI/Hepatic negative GI ROS, Neg liver ROS, GERD  Medicated,  Endo/Other  negative endocrine ROSHypothyroidism Morbid obesity  Renal/GU negative Renal ROS  negative genitourinary   Musculoskeletal  (+) Arthritis , Osteoarthritis,    Abdominal   Peds  Hematology negative hematology ROS (+)   Anesthesia Other Findings Pt with remote history of of paralysis including aphasia approximately two month ago. No focal deficits. Symptoms were a few hours duration. 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     Reproductive/Obstetrics negative OB ROS                            Anesthesia Physical Anesthesia Plan  ASA: 3  Anesthesia Plan: General   Post-op Pain Management:    Induction:   PONV Risk Score and Plan: 4 or greater  Airway Management Planned: Video Laryngoscope Planned  Additional Equipment:   Intra-op Plan:   Post-operative Plan:   Informed Consent: I have reviewed the patients History and Physical,  chart, labs and discussed the procedure including the risks, benefits and alternatives for the proposed anesthesia with the patient or authorized representative who has indicated his/her understanding and acceptance.     Dental Advisory Given  Plan Discussed with: CRNA  Anesthesia Plan Comments:        Anesthesia Quick Evaluation
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
## 2021-07-26 NOTE — Anesthesia Procedure Notes (Signed)
Procedure Name: Intubation Date/Time: 07/26/2021 4:30 PM Performed by: Nelda Marseille, CRNA Pre-anesthesia Checklist: Patient identified, Patient being monitored, Timeout performed, Emergency Drugs available and Suction available Patient Re-evaluated:Patient Re-evaluated prior to induction Oxygen Delivery Method: Circle system utilized Preoxygenation: Pre-oxygenation with 100% oxygen Induction Type: IV induction Ventilation: Mask ventilation without difficulty Laryngoscope Size: Mac, 3 and McGraph Grade View: Grade I Tube type: Oral Tube size: 7.0 mm Number of attempts: 1 Airway Equipment and Method: Stylet Placement Confirmation: ETT inserted through vocal cords under direct vision, positive ETCO2 and breath sounds checked- equal and bilateral Secured at: 21 cm Tube secured with: Tape Dental Injury: Teeth and Oropharynx as per pre-operative assessment

## 2021-07-27 ENCOUNTER — Encounter: Payer: Self-pay | Admitting: Orthopedic Surgery

## 2021-07-27 LAB — LATEX, IGE: Latex: <0.1 kU/L

## 2021-07-27 MED ORDER — SUGAMMADEX SODIUM 500 MG/5ML IV SOLN
INTRAVENOUS | Status: DC | PRN
  Administered 2021-07-26: 470 mg via INTRAVENOUS

## 2021-07-28 ENCOUNTER — Encounter: Payer: 59 | Admitting: Obstetrics and Gynecology

## 2021-07-28 ENCOUNTER — Other Ambulatory Visit: Payer: Self-pay | Admitting: Family Medicine

## 2021-07-28 DIAGNOSIS — M8949 Other hypertrophic osteoarthropathy, multiple sites: Secondary | ICD-10-CM

## 2021-07-28 DIAGNOSIS — G629 Polyneuropathy, unspecified: Secondary | ICD-10-CM

## 2021-07-28 DIAGNOSIS — M159 Polyosteoarthritis, unspecified: Secondary | ICD-10-CM

## 2021-07-28 DIAGNOSIS — R4182 Altered mental status, unspecified: Secondary | ICD-10-CM

## 2021-07-28 DIAGNOSIS — E785 Hyperlipidemia, unspecified: Secondary | ICD-10-CM

## 2021-07-28 NOTE — Anesthesia Postprocedure Evaluation (Signed)
Anesthesia Post Note  Patient: Elizabeth Wyatt  Procedure(s) Performed: ARTHROSCOPY KNEE (Left: Knee)  Patient location during evaluation: PACU Anesthesia Type: General Level of consciousness: awake and alert Pain management: pain level controlled Vital Signs Assessment: post-procedure vital signs reviewed and stable Respiratory status: spontaneous breathing, nonlabored ventilation, respiratory function stable and patient connected to nasal cannula oxygen Cardiovascular status: blood pressure returned to baseline and stable Postop Assessment: no apparent nausea or vomiting Anesthetic complications: no   No notable events documented.   Last Vitals:  Vitals:   07/26/21 1826 07/26/21 1838  BP: 112/60 114/65  Pulse: 76 72  Resp: 20 13  Temp: 36.5 C (!) 36.4 C  SpO2: 97% 100%    Last Pain:  Vitals:   07/27/21 0820  PainSc: 5                  Yevette Edwards

## 2021-09-21 ENCOUNTER — Other Ambulatory Visit: Payer: Self-pay | Admitting: Family Medicine

## 2021-09-21 DIAGNOSIS — E034 Atrophy of thyroid (acquired): Secondary | ICD-10-CM

## 2021-09-22 ENCOUNTER — Other Ambulatory Visit: Payer: Self-pay | Admitting: Family Medicine

## 2021-09-22 DIAGNOSIS — B372 Candidiasis of skin and nail: Secondary | ICD-10-CM

## 2021-09-22 NOTE — Telephone Encounter (Signed)
Requested medications are due for refill today.  yes  Requested medications are on the active medications list.  yes  Last refill. 09/14/2020  Future visit scheduled.   no  Notes to clinic.  Medication not assigned to a protocol. Pt has not been seen in over 1 year. PCP listed as Dr. Judithann Sheen.
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
## 2021-10-19 ENCOUNTER — Encounter: Payer: Self-pay | Admitting: Obstetrics and Gynecology

## 2021-10-19 ENCOUNTER — Ambulatory Visit (INDEPENDENT_AMBULATORY_CARE_PROVIDER_SITE_OTHER): Payer: 59 | Admitting: Obstetrics and Gynecology

## 2021-10-19 ENCOUNTER — Other Ambulatory Visit: Payer: Self-pay

## 2021-10-19 VITALS — BP 110/60 | HR 80 | Resp 16 | Ht <= 58 in | Wt 270.1 lb

## 2021-10-19 DIAGNOSIS — B372 Candidiasis of skin and nail: Secondary | ICD-10-CM

## 2021-10-19 DIAGNOSIS — R7303 Prediabetes: Secondary | ICD-10-CM | POA: Diagnosis not present

## 2021-10-19 MED ORDER — FLUCONAZOLE 100 MG PO TABS: ORAL_TABLET | ORAL | 0 refills | Status: DC

## 2021-10-19 MED ORDER — NYSTATIN 100000 UNIT/GM EX CREA: 1.0000 "application " | TOPICAL_CREAM | Freq: Two times a day (BID) | CUTANEOUS | 1 refills | Status: DC

## 2021-10-21 NOTE — Patient Instructions (Signed)
? ?  Skin Yeast Infection ?A skin yeast infection is a condition in which there is an overgrowth of yeast (Candida) that normally lives on the skin. This condition usually occurs in areas of the skin that are constantly warm and moist, such as the skin under the breasts or armpits, or in the groin and other body folds. ?What are the causes? ?This condition is caused by a change in the normal balance of the yeast that live on the skin. ?What increases the risk? ?You are more likely to develop this condition if you: ?Are obese. ?Are pregnant. ?Are 65 years of age or older. ?Wear tight clothing. ?Have any of the following conditions: ?Diabetes. ?Malnutrition. ?A weak body defense system (immune system). ?Take medicines such as: ?Birth control pills. ?Antibiotics. ?Steroid medicines. ?What are the signs or symptoms? ?The most common symptom of this condition is itchiness in the affected area. Other symptoms include: ?A red, swollen area of the skin. ?Bumps on the skin. ?How is this diagnosed? ?This condition is diagnosed with a medical history and physical exam. Your health care provider may check for yeast by taking scrapings of the skin to be viewed under a microscope. ?How is this treated? ?This condition is treated with medicine. Medicines may be prescribed or available over the counter. The medicines may be: ?Taken by mouth (orally). ?Applied as a cream or powder to your skin. ?Follow these instructions at home: ? ?Take or apply over-the-counter and prescription medicines only as told by your health care provider. ?Maintain a healthy weight. If you need help losing weight, talk with your health care provider. ?Keep your skin clean and dry. ?Wear loose-fitting clothing. ?If you have diabetes, keep your blood sugar under control. ?Keep all follow-up visits. This is important. ?Contact a health care provider if: ?Your symptoms go away and then come back. ?Your symptoms do not get better with treatment. ?Your symptoms  get worse. ?Your rash spreads. ?You have a fever or chills. ?You have new symptoms. ?You have new warmth or redness of your skin. ?Your rash is painful or bleeding. ?Summary ?A skin yeast infection is a condition in which there is an overgrowth of yeast (Candida) that normally lives on the skin. ?Take or apply over-the-counter and prescription medicines only as told by your health care provider. ?Keep your skin clean and dry. ?Contact a health care provider if your symptoms do not get better with treatment. ?This information is not intended to replace advice given to you by your health care provider. Make sure you discuss any questions you have with your health care provider. ?Document Revised: 02/16/2021 Document Reviewed: 02/16/2021 ?Elsevier Patient Education ? 2022 Elsevier Inc[employer] ? ?

## 2021-11-02 ENCOUNTER — Ambulatory Visit (INDEPENDENT_AMBULATORY_CARE_PROVIDER_SITE_OTHER): Payer: 59 | Admitting: Obstetrics and Gynecology

## 2021-11-02 ENCOUNTER — Encounter: Payer: Self-pay | Admitting: Obstetrics and Gynecology

## 2021-11-02 ENCOUNTER — Other Ambulatory Visit: Payer: Self-pay

## 2021-11-02 VITALS — BP 108/58 | HR 84 | Ht 60.0 in | Wt 267.8 lb

## 2021-11-02 DIAGNOSIS — Z6841 Body Mass Index (BMI) 40.0 and over, adult: Secondary | ICD-10-CM | POA: Diagnosis not present

## 2021-11-02 DIAGNOSIS — R7303 Prediabetes: Secondary | ICD-10-CM

## 2021-11-02 DIAGNOSIS — B372 Candidiasis of skin and nail: Secondary | ICD-10-CM

## 2021-11-02 MED ORDER — DYNA-HEX 4 4 % EX SOLN: CUTANEOUS | 1 refills | Status: DC

## 2021-11-02 NOTE — Progress Notes (Signed)
    GYNECOLOGY PROGRESS NOTE  Subjective:    Patient ID: Elizabeth Wyatt, female    DOB: 1956-08-25, 65 y.o.   MRN: [id number]  HPI  Patient is a 65 y.o. G37P1001 female who presents for 2 week follow up of yeast dermatitis rash.  She states that she has used the cream as prescribed and has taken the full course of Diflucan.  Notes that the itching and irritation has mostly resolved underneath breasts and armpits. Also reports that she has been using some leftover soap from her previous surgery and has been using this as well. Does feel some mild irritation in groin areas.  The following portions of the patient's history were reviewed and updated as appropriate: allergies, current medications, past family history, past medical history, past social history, past surgical history, and problem list.  Review of Systems Pertinent items noted in HPI and remainder of comprehensive ROS otherwise negative.   Objective:   Blood pressure (!) 108/58, pulse 84, height 5' (1.524 m), weight 267 lb 12.8 oz (121.5 kg). Body mass index is 52.3 kg/m. General appearance: alert and no distress Skin: mildly chaffed areas of skin in axillary regions and beneath the breasts. No erythema or rash noted.  Pelvis: deferred.    Assessment:   1. Yeast dermatitis   2. Morbid obesity with BMI of 50.0-59.9, adult (HCC)   3. Prediabetes     Plan:   Yeast dermatitis improved with treatment. Can return to weekly prophylactic dosing of Diflucan. Also can utilize cream in groin region if another rash is appearing.  Discussed patient's prediabetes and obesity. Advised that these conditions are risk factors for recurrences of yeast dermatitis. Offered option of management with semaglutides to aid with management and further weight loss. She notes that she will think about it.   Return to clinic for any scheduled appointments or for any gynecologic concerns as needed.    Hildred Laser, MD [facility]

## 2021-11-06 ENCOUNTER — Encounter: Payer: Self-pay | Admitting: Obstetrics and Gynecology

## 2021-12-21 ENCOUNTER — Encounter: Payer: Self-pay | Admitting: Internal Medicine

## 2021-12-22 ENCOUNTER — Encounter
Admission: RE | Disposition: A | Discharge status: Home / Self Care | Payer: Self-pay | Source: Ambulatory Visit | Attending: Internal Medicine

## 2021-12-22 ENCOUNTER — Ambulatory Visit: Payer: 59 | Admitting: Anesthesiology

## 2021-12-22 ENCOUNTER — Ambulatory Visit
Admission: RE | Admit: 2021-12-22 | Discharge: 2021-12-22 | Disposition: A | Discharge status: Home / Self Care | Payer: 59 | Source: Ambulatory Visit | Attending: Internal Medicine | Admitting: Internal Medicine

## 2021-12-22 ENCOUNTER — Encounter: Payer: Self-pay | Admitting: Internal Medicine

## 2021-12-22 DIAGNOSIS — K219 Gastro-esophageal reflux disease without esophagitis: Secondary | ICD-10-CM | POA: Diagnosis not present

## 2021-12-22 DIAGNOSIS — I252 Old myocardial infarction: Secondary | ICD-10-CM | POA: Insufficient documentation

## 2021-12-22 DIAGNOSIS — Z7951 Long term (current) use of inhaled steroids: Secondary | ICD-10-CM | POA: Insufficient documentation

## 2021-12-22 DIAGNOSIS — Z87891 Personal history of nicotine dependence: Secondary | ICD-10-CM | POA: Diagnosis not present

## 2021-12-22 DIAGNOSIS — Z6841 Body Mass Index (BMI) 40.0 and over, adult: Secondary | ICD-10-CM | POA: Diagnosis not present

## 2021-12-22 DIAGNOSIS — I2510 Atherosclerotic heart disease of native coronary artery without angina pectoris: Secondary | ICD-10-CM | POA: Diagnosis not present

## 2021-12-22 DIAGNOSIS — K640 First degree hemorrhoids: Secondary | ICD-10-CM | POA: Diagnosis not present

## 2021-12-22 DIAGNOSIS — E039 Hypothyroidism, unspecified: Secondary | ICD-10-CM | POA: Insufficient documentation

## 2021-12-22 DIAGNOSIS — Z1211 Encounter for screening for malignant neoplasm of colon: Secondary | ICD-10-CM | POA: Diagnosis not present

## 2021-12-22 DIAGNOSIS — K5730 Diverticulosis of large intestine without perforation or abscess without bleeding: Secondary | ICD-10-CM | POA: Insufficient documentation

## 2021-12-22 DIAGNOSIS — J45909 Unspecified asthma, uncomplicated: Secondary | ICD-10-CM | POA: Insufficient documentation

## 2021-12-22 DIAGNOSIS — I10 Essential (primary) hypertension: Secondary | ICD-10-CM | POA: Diagnosis not present

## 2021-12-22 DIAGNOSIS — R7303 Prediabetes: Secondary | ICD-10-CM | POA: Insufficient documentation

## 2021-12-22 DIAGNOSIS — Z86010 Personal history of colonic polyps: Secondary | ICD-10-CM | POA: Insufficient documentation

## 2021-12-22 HISTORY — DX: Headache, unspecified: R51.9

## 2021-12-22 HISTORY — DX: Hypothyroidism, unspecified: E03.9

## 2021-12-22 HISTORY — DX: Edema, unspecified: R60.9

## 2021-12-22 HISTORY — DX: Other intervertebral disc degeneration, lumbar region: M51.36

## 2021-12-22 HISTORY — PX: COLONOSCOPY: SHX5424

## 2021-12-22 HISTORY — DX: Other intervertebral disc degeneration, lumbar region without mention of lumbar back pain or lower extremity pain: M51.369

## 2021-12-22 HISTORY — DX: Other chest pain: R07.89

## 2021-12-22 HISTORY — DX: Depression, unspecified: F32.A

## 2021-12-22 HISTORY — DX: Morbid (severe) obesity due to excess calories: E66.01

## 2021-12-22 SURGERY — COLONOSCOPY
Anesthesia: General

## 2021-12-22 MED ORDER — PROPOFOL 500 MG/50ML IV EMUL
INTRAVENOUS | Status: DC | PRN
  Administered 2021-12-22: 150 ug/kg/min via INTRAVENOUS

## 2021-12-22 MED ORDER — LIDOCAINE HCL (CARDIAC) PF 100 MG/5ML IV SOSY
PREFILLED_SYRINGE | INTRAVENOUS | Status: DC | PRN
  Administered 2021-12-22: 50 mg via INTRAVENOUS

## 2021-12-22 MED ORDER — PROPOFOL 10 MG/ML IV BOLUS
INTRAVENOUS | Status: DC | PRN
  Administered 2021-12-22: 70 mg via INTRAVENOUS

## 2021-12-22 MED ORDER — PROPOFOL 500 MG/50ML IV EMUL
INTRAVENOUS | Status: AC
  Filled 2021-12-22: qty 50

## 2021-12-22 MED ORDER — SODIUM CHLORIDE 0.9 % IV SOLN: INTRAVENOUS | Status: DC

## 2021-12-22 NOTE — Anesthesia Procedure Notes (Signed)
Date/Time: 12/22/2021 10:36 AM Performed by: Ginger Carne, CRNA Pre-anesthesia Checklist: Patient identified, Emergency Drugs available, Suction available, Patient being monitored and Timeout performed Patient Re-evaluated:Patient Re-evaluated prior to induction Oxygen Delivery Method: Nasal cannula Preoxygenation: Pre-oxygenation with 100% oxygen Induction Type: IV induction

## 2021-12-22 NOTE — Interval H&P Note (Signed)
History and Physical Interval Note:  12/22/2021 10:33 AM  Elizabeth Wyatt  has presented today for surgery, with the diagnosis of HX ADEN POLYP.  The various methods of treatment have been discussed with the patient and family. After consideration of risks, benefits and other options for treatment, the patient has consented to  Procedure(s): COLONOSCOPY (N/A) as a surgical intervention.  The patient's history has been reviewed, patient examined, no change in status, stable for surgery.  I have reviewed the patient's chart and labs.  Questions were answered to the patient's satisfaction.     [place], [place]

## 2021-12-22 NOTE — Anesthesia Postprocedure Evaluation (Signed)
Anesthesia Post Note  Patient: Elizabeth Wyatt  Procedure(s) Performed: COLONOSCOPY  Patient location during evaluation: PACU Anesthesia Type: General Level of consciousness: awake and alert, oriented and patient cooperative Pain management: pain level controlled Vital Signs Assessment: post-procedure vital signs reviewed and stable Respiratory status: spontaneous breathing, nonlabored ventilation and respiratory function stable Cardiovascular status: blood pressure returned to baseline and stable Postop Assessment: adequate PO intake Anesthetic complications: no   No notable events documented.   Last Vitals:  Vitals:   12/22/21 1115 12/22/21 1119  BP:  (!) 112/56  Pulse:  76  Resp:  17  Temp: (!) 35.9 C   SpO2:  99%    Last Pain:  Vitals:   12/22/21 1119  TempSrc:   PainSc: 0-No pain                 Darrin Nipper

## 2021-12-22 NOTE — Op Note (Signed)
[facility] Gastroenterology Patient Name: Elizabeth Wyatt Procedure Date: 12/22/2021 10:10 AM MRN: [id number] Account #: [id number] Date of Birth: Jan 31, 1956 Admit Type: Outpatient Age: 66 Room: [facility] ENDO ROOM 2 Gender: Female Note Status: Finalized Instrument Name: Prentice DockerColonoscope [id number] Procedure:             Colonoscopy Indications:           Surveillance: Personal history of colonic polyps                         (unknown histology) on last colonoscopy more than 5                         years ago Providers:             Royce Macadamiaeodoro K. Sukhman Kocher MD, MD Medicines:             Propofol per Anesthesia Complications:         No immediate complications. Procedure:             Pre-Anesthesia Assessment:                        - The risks and benefits of the procedure and the                         sedation options and risks were discussed with the                         patient. All questions were answered and informed                         consent was obtained.                        - Patient identification and proposed procedure were                         verified prior to the procedure by the nurse. The                         procedure was verified in the procedure room.                        - ASA Grade Assessment: III - A patient with severe                         systemic disease.                        - After reviewing the risks and benefits, the patient                         was deemed in satisfactory condition to undergo the                         procedure.                        After obtaining informed consent, the colonoscope was  passed under direct vision. Throughout the procedure,                         the patient's blood pressure, pulse, and oxygen                         saturations were monitored continuously. The                         Colonoscope was introduced through the anus and                         advanced  to the the cecum, identified by appendiceal                         orifice and ileocecal valve. The colonoscopy was                         performed without difficulty. The patient tolerated                         the procedure well. The quality of the bowel                         preparation was adequate. The ileocecal valve,                         appendiceal orifice, and rectum were photographed. Findings:      The perianal and digital rectal examinations were normal. Pertinent       negatives include normal sphincter tone and no palpable rectal lesions.      Non-bleeding internal hemorrhoids were found during retroflexion. The       hemorrhoids were Grade I (internal hemorrhoids that do not prolapse).      Many small-mouthed diverticula were found in the sigmoid colon. There       was no evidence of diverticular bleeding.      No other significant abnormalities were identified in a careful       examination of the remainder of the colon. Impression:            - Non-bleeding internal hemorrhoids.                        - Mild diverticulosis in the sigmoid colon. There was                         no evidence of diverticular bleeding.                        - No specimens collected. Recommendation:        - Patient has a contact number available for                         emergencies. The signs and symptoms of potential                         delayed complications were discussed with the patient.  Return to normal activities tomorrow. Written                         discharge instructions were provided to the patient.                        - Resume previous diet.                        - Continue present medications.                        - Repeat colonoscopy in 10 years for screening                         purposes.                        - Return to GI office PRN.                        - The findings and recommendations were discussed with                          the patient. Procedure Code(s):     --- Professional ---                        E5277, Colorectal cancer screening; colonoscopy on                         individual at high risk Diagnosis Code(s):     --- Professional ---                        K57.30, Diverticulosis of large intestine without                         perforation or abscess without bleeding                        K64.0, First degree hemorrhoids                        Z86.010, Personal history of colonic polyps CPT copyright 2019 [employer]. All rights reserved. The codes documented in this report are preliminary and upon coder review may  be revised to meet current compliance requirements. Stanton Kidney MD, MD 12/22/2021 10:56:38 AM This report has been signed electronically. Number of Addenda: 0 Note Initiated On: 12/22/2021 10:10 AM Scope Withdrawal Time: 0 hours 7 minutes 1 second  Total Procedure Duration: 0 hours 12 minutes 1 second  Estimated Blood Loss:  Estimated blood loss: none.      [facility]

## 2021-12-22 NOTE — Anesthesia Preprocedure Evaluation (Addendum)
Anesthesia Evaluation  Patient identified by MRN, date of birth, ID band Patient awake    Reviewed: Allergy & Precautions, NPO status , Patient's Chart, lab work & pertinent test results  History of Anesthesia Complications (+) PROLONGED EMERGENCE and history of anesthetic complications  Airway Mallampati: II   Neck ROM: Full    Dental   Missing several molars:   Pulmonary asthma , former smoker (quit 1993),    Pulmonary exam normal breath sounds clear to auscultation       Cardiovascular hypertension, + CAD and + Past MI  Normal cardiovascular exam Rhythm:Regular Rate:Normal  ECG 07/09/21: SR, poor R-wave progression   Neuro/Psych  Headaches, PSYCHIATRIC DISORDERS (PTSD) Anxiety Depression CVA (residual right-sided weakness)    GI/Hepatic GERD  ,  Endo/Other  Hypothyroidism Prediabetes, class 3 obesity  Renal/GU negative Renal ROS     Musculoskeletal   Abdominal   Peds  Hematology negative hematology ROS (+)   Anesthesia Other Findings Reviewed 07/09/21 cardiology note.  Reproductive/Obstetrics                            Anesthesia Physical Anesthesia Plan  ASA: 3  Anesthesia Plan: General   Post-op Pain Management:    Induction: Intravenous  PONV Risk Score and Plan: 3 and Propofol infusion, TIVA and Treatment may vary due to age or medical condition  Airway Management Planned: Natural Airway  Additional Equipment:   Intra-op Plan:   Post-operative Plan:   Informed Consent: I have reviewed the patients History and Physical, chart, labs and discussed the procedure including the risks, benefits and alternatives for the proposed anesthesia with the patient or authorized representative who has indicated his/her understanding and acceptance.       Plan Discussed with: CRNA  Anesthesia Plan Comments: (LMA/GETA backup discussed.  Patient consented for risks of anesthesia  including but not limited to:  - adverse reactions to medications - damage to eyes, teeth, lips or other oral mucosa - nerve damage due to positioning  - sore throat or hoarseness - damage to heart, brain, nerves, lungs, other parts of body or loss of life  Informed patient about role of CRNA in peri- and intra-operative care.  Patient voiced understanding.)        Anesthesia Quick Evaluation

## 2021-12-22 NOTE — H&P (Signed)
Outpatient short stay form Pre-procedure 12/22/2021 9:53 AM Elizabeth Wyatt K. Norma Fredrickson, M.D.  Primary Physician: Aram Beecham  Reason for visit:  Personal history of colon polyps (adenomatous colon polyps).  History of present illness:                            Patient presents for colonoscopy for a personal hx of colon polyps. The patient denies abdominal pain, abnormal weight loss or rectal bleeding.      Current Facility-Administered Medications:    0.9 %  sodium chloride infusion, , Intravenous, Continuous, Moriah Loughry, Boykin Nearing, MD  Medications Prior to Admission  Medication Sig Dispense Refill Last Dose   meclizine (ANTIVERT) 25 MG tablet Take 25 mg by mouth 3 (three) times daily as needed for dizziness.      topiramate (TOPAMAX) 50 MG tablet Take 50 mg by mouth 2 (two) times daily.      acetaminophen (TYLENOL) 500 MG tablet Take 500 mg by mouth every 6 (six) hours as needed.      albuterol (PROAIR HFA) 108 (90 Base) MCG/ACT inhaler Inhale 2 puffs into the lungs every 4 (four) hours as needed for wheezing or shortness of breath. 8.5 Inhaler 3    aspirin EC 81 MG tablet Take 1 tablet (81 mg total) by mouth daily.      atorvastatin (LIPITOR) 10 MG tablet Take 1 tablet (10 mg total) by mouth daily. 90 tablet 0    cetirizine (ZYRTEC) 10 MG tablet Take 10 mg by mouth as needed.      Chlorhexidine Gluconate (DYNA-HEX 4) 4 % SOLN Wash daily at affected area as needed. 118 mL 1    DULoxetine (CYMBALTA) 60 MG capsule Take 60 mg by mouth 2 (two) times daily.      fluconazole (DIFLUCAN) 100 MG tablet Take two tablets on Day 1. Then 1 tablet daily for 14 days. 15 tablet 0    fluconazole (DIFLUCAN) 200 MG tablet TAKE 1 TABLET (200 MG TOTAL) BY MOUTH ONCE A WEEK. FOR MAINTENANCE 4 tablet 11    fluticasone (FLOVENT HFA) 110 MCG/ACT inhaler Inhale 2 puffs into the lungs 2 (two) times a day. 12 g 5    gabapentin (NEURONTIN) 100 MG capsule Take 3 capsules (300 mg total) by mouth at bedtime. 270 capsule 0     levothyroxine (SYNTHROID) 50 MCG tablet Take 1 tablet (50 mcg total) by mouth daily. 90 tablet 0    Naproxen Sodium (ALEVE PO) Take by mouth.      nebivolol (BYSTOLIC) 10 MG tablet Take 1 tablet (10 mg total) by mouth daily. Take one tab by mouth daily 90 tablet 3    nitroGLYCERIN (NITROSTAT) 0.4 MG SL tablet Place 1 tablet (0.4 mg total) under the tongue every 5 (five) minutes as needed. 45 tablet 3    nystatin (MYCOSTATIN/NYSTOP) powder ONE APPLICATION TWICE A DAY TO RASH FOR UP TO 1-2 WEEKS AS NEEDED 45 g 2    nystatin cream (MYCOSTATIN) Apply 1 application topically 2 (two) times daily. Use for up to 2 weeks until rash disappears. 30 g 1    omeprazole (PRILOSEC) 20 MG capsule Take 1 capsule (20 mg total) by mouth daily before breakfast. 90 capsule 0    verapamil (VERELAN PM) 240 MG 24 hr capsule TAKE 1 CAPSULE BY MOUTH EVERYDAY AT BEDTIME 90 capsule 0      Allergies  Allergen Reactions   Diphenhydramine Hcl     Increased heart rate  Guaifenesin     Other reaction(s): Other (See Comments) Other reaction(s): Other (See Comments) Nightmares Nightmares   Latex Other (See Comments)    Blisters,then peels skin   Montelukast Sodium Other (See Comments)    Jittery   Pamabrom     This medication is in midol and caused anuria   Simvastatin Other (See Comments)    dizziness   Sulfonamide Derivatives     vomiting   Wellbutrin [Bupropion]     Caused major depression and SUICIDAL IDEATION.     Past Medical History:  Diagnosis Date   Allergy    Asthma    Atypical chest pain    CAD (coronary artery disease)    DDD (degenerative disc disease), lumbar    Depression    DOE (dyspnea on exertion)    Edema    Family history of malignant neoplasm of breast    GERD (gastroesophageal reflux disease)    Headache    HLD (hyperlipidemia)    HLD (hyperlipidemia)    Hypertension    Hypothyroidism    Incomplete miscarriage with blood clot    Morbid obesity with body mass index of 50.0-59.9  in adult [facility])    Myocardial infarction (HCC)    Obesity    Osteoarthritis    Personal history of colonic polyps    Personal history of tobacco use, presenting hazards to health    Pre-diabetes    Prediabetes    PTSD (post-traumatic stress disorder)    Special screening for malignant neoplasms, colon    Stress at home    issues with ex-husband; cares for son with autism   Stroke [facility])    TIA (transient ischemic attack)     Review of systems:  Otherwise negative.    Physical Exam  Gen: Alert, oriented. Appears stated age.  HEENT: [place]/AT. PERRLA. Lungs: CTA, no wheezes. CV: RR nl S1, S2. Abd: soft, benign, no masses. BS+ Ext: No edema. Pulses 2+    Planned procedures: Proceed with colonoscopy. The patient understands the nature of the planned procedure, indications, risks, alternatives and potential complications including but not limited to bleeding, infection, perforation, damage to internal organs and possible oversedation/side effects from anesthesia. The patient agrees and gives consent to proceed.  Please refer to procedure notes for findings, recommendations and patient disposition/instructions.     Elizabeth Wyatt K. Norma Fredrickson, M.D. Gastroenterology 12/22/2021  9:53 AM

## 2021-12-22 NOTE — Transfer of Care (Signed)
Immediate Anesthesia Transfer of Care Note  Patient: Elizabeth Wyatt  Procedure(s) Performed: COLONOSCOPY  Patient Location: PACU  Anesthesia Type:General  Level of Consciousness: sedated  Airway & Oxygen Therapy: Patient Spontanous Breathing  Post-op Assessment: Report given to RN and Post -op Vital signs reviewed and stable  Post vital signs: Reviewed and stable  Last Vitals:  Vitals Value Taken Time  BP 104/63 12/22/21 1100  Temp    Pulse 82 12/22/21 1100  Resp 14 12/22/21 1100  SpO2 95 % 12/22/21 1100    Last Pain:  Vitals:   12/22/21 1059  TempSrc:   PainSc: Asleep         Complications: No notable events documented.

## 2021-12-23 ENCOUNTER — Encounter: Payer: Self-pay | Admitting: Internal Medicine

## 2022-01-12 ENCOUNTER — Other Ambulatory Visit: Payer: Self-pay | Admitting: Cardiovascular Disease

## 2022-01-12 DIAGNOSIS — I10 Essential (primary) hypertension: Secondary | ICD-10-CM

## 2022-01-13 ENCOUNTER — Other Ambulatory Visit: Payer: Self-pay | Admitting: Cardiovascular Disease

## 2022-01-13 DIAGNOSIS — I10 Essential (primary) hypertension: Secondary | ICD-10-CM

## 2022-02-17 ENCOUNTER — Telehealth: Payer: Self-pay | Admitting: *Deleted

## 2022-02-17 NOTE — Telephone Encounter (Signed)
PA HAS BEEN SUBMITTED TO [employer]. WAITING ON DECISION.  ? ?KEY [id number] ?RX # [id number] ? ? ?

## 2022-02-17 NOTE — Telephone Encounter (Signed)
This request has received a Favorable outcome. ? ?Please note any additional information provided by [facility] at the bottom of your screen. ? ?Outcome ?Approved Today ?Request Reference Number: [id number]. NEBIVOLOL TAB 10MG  is approved through 02/18/2023. Your patient may now fill this prescription and it will be covered. ?

## 2022-04-08 ENCOUNTER — Other Ambulatory Visit: Payer: Self-pay | Admitting: General Surgery

## 2022-04-08 DIAGNOSIS — Z129 Encounter for screening for malignant neoplasm, site unspecified: Secondary | ICD-10-CM

## 2022-04-12 ENCOUNTER — Other Ambulatory Visit: Payer: Self-pay | Admitting: General Surgery

## 2022-04-12 DIAGNOSIS — Z1231 Encounter for screening mammogram for malignant neoplasm of breast: Secondary | ICD-10-CM

## 2022-04-20 ENCOUNTER — Telehealth: Payer: Self-pay | Admitting: Cardiovascular Disease

## 2022-04-20 NOTE — Telephone Encounter (Signed)
Patient is calling wanting to know if the office has received the Echo results from Dr. Judithann Sheen. States it was sent last Thursday or Friday. Is wanting to discuss the results with Dr. Mariah Milling.  ?

## 2022-04-25 NOTE — Telephone Encounter (Signed)
Antonieta Iba, MD   ? ?I am unable to see images as it is in the [employer] system  ?Based on their report it is the same as it was in 2012 no significant change  ?Pretty normal study  ?Thx  ?TG   ? ?Patient concerned because she doesn't remember that she's ever had that thorough of echo done and having the diagnosis of stenosis and regurgitation it worried her that something more was wrong. Pt states that she felt like she got a flip kind of attitude from her PCP and didn't feel like she was responded to appropriately when she asked her PCP to forward the results to Dr. Mariah Milling immediately.  ? ?I explained that it didn't seem as though they had, and apologized that once she called to ask about it, I notified Dr. Mariah Milling but he just responded to my message last night. I explained that her echo results looked pretty normal to Dr. Mariah Milling and that she could discuss her concerns with Dr. Mariah Milling at her appointment next month.  ? ?Pt verbalized understanding and voiced appreciation for the call. ? ? ?

## 2022-05-11 ENCOUNTER — Ambulatory Visit
Admission: RE | Admit: 2022-05-11 | Discharge: 2022-05-11 | Disposition: A | Discharge status: Home / Self Care | Payer: Commercial Managed Care - PPO | Source: Ambulatory Visit | Attending: General Surgery | Admitting: General Surgery

## 2022-05-11 DIAGNOSIS — Z1231 Encounter for screening mammogram for malignant neoplasm of breast: Secondary | ICD-10-CM | POA: Diagnosis present

## 2022-05-22 NOTE — Progress Notes (Unsigned)
Date:  05/23/2022   ID:  Elizabeth Wyatt, DOB [birth year], MRN [id number]  Patient Location:  [place] [address]   Provider location:   [facility], [place] office  PCP:  Elizabeth Crouch, Wyatt  Cardiologist:  Elizabeth Wyatt [facility]   Chief Complaint  Patient presents with   Follow up Echo    Patient c/o chest tightness and shortness of breath. Medications reviewed by the patient verbally.     History of Present Illness:    Elizabeth Wyatt is a 66 y.o. female  past medical history of morbid obesity,  hypertension,  hyperlipidemia,  chronic chest pain  cardiac catheterization May 2009   no significant coronary artery disease (circumflex coming off the ostium of the RCA)  significant stress at home, son who has autism. Separated from her husband Who presents for routine followup of her chest pain symptoms.   Last seen by myself in clinic July 2022  In follow-up reports she is doing well Underwent arthroscopic repair of meniscus August 2022 Elizabeth Wyatt Reports she has good recovery, now walking with a cane not in a wheelchair  Weight higher, poor diet Weight 275, on prior clinic visit was 8  Labs reviewed: A1C 6.8, trending higher Total chol 139, LDL 42  Echo at [place]: Normal EF Mention of mild aortic valve stenosis but gradient was very low  Continue stress at home, has high functioning son with disabilities, Elizabeth Wyatt  EKG personally reviewed by myself on todays visit NSR rate 75 poor poor R wave progression  Other past medical history reviewed Previous episode of paralysis, aphasia approximately one month ago. Woke up, could not move, could not open her eyes, could not speak. No focal deficits. She reports that her per minute was working but she could not communicate. After several hours was able to call for help   Reports having similar symptoms many years ago and was kept in the hospital at that time. 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    CT scan of the head showed no pathology She is scheduled to have carotid ultrasound, possibly MRI, follow-up with neurology   Stress test 04/2008:  Cardiac catheter at the same time   Past Medical History:  Diagnosis Date   Allergy    Asthma    Atypical chest pain    CAD (coronary artery disease)    DDD (degenerative disc disease), lumbar    Depression    DOE (dyspnea on exertion)    Edema    Family history of malignant neoplasm of breast    GERD (gastroesophageal reflux disease)    Headache    HLD (hyperlipidemia)    HLD (hyperlipidemia)    Hypertension    Hypothyroidism    Incomplete miscarriage with blood clot    Morbid obesity with body mass index of 50.0-59.9 in adult [facility])    Myocardial infarction ([place])    Obesity    Osteoarthritis    Personal history of colonic polyps    Personal history of tobacco use, presenting hazards to health    Pre-diabetes    Prediabetes    PTSD (post-traumatic stress disorder)    Special screening for malignant neoplasms, colon    Stress at home    issues with ex-husband; cares for son with autism   Stroke [facility])    TIA (transient ischemic attack)    Past Surgical History:  Procedure Laterality Date   BREAST BIOPSY Wyatt 2009   BREAST BIOPSY Wyatt 04/2018   ductal hyperplasia, cystic and papillary metaplasia, microscopic intraducal papilloma, sclerosing adenosis   BREAST MASS EXCISION Wyatt 2009   CARDIAC CATHETERIZATION  2010   Dr. Rockey Wyatt with [facility]   COLONOSCOPY  2008, 2014   [facility]   COLONOSCOPY N/A 12/22/2021   Procedure: COLONOSCOPY;  Surgeon: Elizabeth Wyatt;  Location: [facility] ENDOSCOPY;  Service: Gastroenterology;  Laterality: N/A;   DILATION AND CURETTAGE OF UTERUS  2012   KNEE ARTHROSCOPY Left 07/26/2021   Procedure: ARTHROSCOPY KNEE;  Surgeon: Elizabeth Leep, Wyatt;  Location: [facility] ORS;  Service: Orthopedics;  Laterality: Left;   MOUTH  SURGERY     MOUTH SURGERY     UTERINE FIBROID SURGERY  2012      Allergies:   Diphenhydramine hcl, Guaifenesin, Latex, Montelukast sodium, Pamabrom, Simvastatin, Sulfonamide derivatives, Wellbutrin [bupropion], and Quetiapine   Social History   Tobacco Use   Smoking status: Former    Packs/day: 3.00    Years: 3.00    Total pack years: 9.00    Types: Cigarettes    Quit date: 1993    Years since quitting: 30.4   Smokeless tobacco: Former   Tobacco comments:    tobacco use - no   Vaping Use   Vaping Use: Never used  Substance Use Topics   Alcohol use: No    Comment: quit drinking years ago, used to drink on weekends   Drug use: No     Current Outpatient Medications on File Prior to Visit  Medication Sig Dispense Refill   acetaminophen (TYLENOL) 500 MG tablet Take 500 mg by mouth every 6 (six) hours as needed.     albuterol (PROAIR HFA) 108 (90 Base) MCG/ACT inhaler Inhale 2 puffs into the lungs every 4 (four) hours as needed for wheezing or shortness of breath. 8.5 Inhaler 3   aspirin EC 81 MG tablet Take 1 tablet (81 mg total) by mouth daily.     atorvastatin (LIPITOR) 10 MG tablet Take 1 tablet (10 mg total) by mouth daily. 90 tablet 0   cetirizine (ZYRTEC) 10 MG tablet Take 10 mg by mouth as needed.     Chlorhexidine Gluconate (DYNA-HEX 4) 4 % SOLN Wash daily at affected area as needed. 118 mL 1   DULoxetine (CYMBALTA) 60 MG capsule Take 60 mg by mouth 2 (two) times daily.     fluconazole (DIFLUCAN) 100 MG tablet Take two tablets on Day 1. Then 1 tablet daily for 14 days. 15 tablet 0   fluconazole (DIFLUCAN) 200 MG tablet TAKE 1 TABLET (200 MG TOTAL) BY MOUTH ONCE A WEEK. FOR MAINTENANCE 4 tablet 11   fluticasone (FLOVENT HFA) 110 MCG/ACT inhaler Inhale 2 puffs into the lungs 2 (two) times a day. 12 g 5   gabapentin (NEURONTIN) 100 MG capsule Take 3 capsules (300 mg total) by mouth at bedtime. 270 capsule 0   levothyroxine (SYNTHROID) 50 MCG tablet Take 1 tablet (50 mcg  total) by mouth daily. 90 tablet 0   Naproxen Sodium (ALEVE PO) Take by mouth.     nebivolol (BYSTOLIC) 10 MG tablet TAKE 1 TABLET (10 MG TOTAL) BY MOUTH DAILY. TAKE ONE TAB BY MOUTH DAILY 30 tablet 5   nitroGLYCERIN (NITROSTAT) 0.4 MG SL tablet Place 1 tablet (0.4 mg total) under the tongue every 5 (five) minutes as needed. 45 tablet 3   nystatin (MYCOSTATIN/NYSTOP) powder ONE APPLICATION TWICE A DAY TO RASH  FOR UP TO 1-2 WEEKS AS NEEDED 45 g 2   nystatin cream (MYCOSTATIN) Apply 1 application topically 2 (two) times daily. Use for up to 2 weeks until rash disappears. 30 g 1   omeprazole (PRILOSEC) 20 MG capsule Take 1 capsule (20 mg total) by mouth daily before breakfast. 90 capsule 0   topiramate (TOPAMAX) 50 MG tablet Take 50 mg by mouth 2 (two) times daily.     verapamil (VERELAN PM) 240 MG 24 hr capsule TAKE 1 CAPSULE (240 MG TOTAL) BY MOUTH AT BEDTIME. 30 capsule 5   meclizine (ANTIVERT) 25 MG tablet Take 25 mg by mouth 3 (three) times daily as needed for dizziness. (Patient not taking: Reported on 12/22/2021)     No current facility-administered medications on file prior to visit.     Family Hx: The patient's family history includes Breast cancer (age of onset: 71) in her mother; Cancer in her maternal grandmother and mother.  ROS:   Please see the history of present illness.    Review of Systems  Constitutional: Negative.   HENT: Negative.    Respiratory: Negative.    Cardiovascular: Negative.   Gastrointestinal: Negative.   Musculoskeletal:  Positive for joint pain.  Neurological: Negative.   Psychiatric/Behavioral: Negative.    All other systems reviewed and are negative.    Labs/Other Tests and Data Reviewed:    Recent Labs: 07/22/2021: BUN 20; Creatinine, Ser 0.89; Potassium 4.0; Sodium 137   Recent Lipid Panel Lab Results  Component Value Date/Time   CHOL 164 04/15/2016 12:00 PM   TRIG 128 04/15/2016 12:00 PM   HDL 94 04/15/2016 12:00 PM   CHOLHDL 1.7 04/15/2016  12:00 PM   CHOLHDL 3 10/24/2013 08:56 AM   LDLCALC 44 04/15/2016 12:00 PM   LDLDIRECT 123.0 10/24/2013 08:56 AM    Wt Readings from Last 3 Encounters:  05/23/22 275 lb 8 oz (125 kg)  12/22/21 269 lb (122 kg)  11/02/21 267 lb 12.8 oz (121.5 kg)     Exam:    BP (!) 120/58 (BP Location: Left Arm, Patient Position: Sitting, Cuff Size: Large)   Pulse 75   Ht 4\' 10"  (1.473 m)   Wt 275 lb 8 oz (125 kg)   SpO2 98%   BMI 57.58 kg/m  Constitutional:  oriented to person, place, and time. No distress.  HENT:  Head: Grossly normal Eyes:  no discharge. No scleral icterus.  Neck: No JVD, no carotid bruits  Cardiovascular: Regular rate and rhythm, no murmurs appreciated Pulmonary/Chest: Clear to auscultation bilaterally, no wheezes or rails Abdominal: Soft.  no distension.  no tenderness.  Musculoskeletal: Normal range of motion Neurological:  normal muscle tone. Coordination normal. No atrophy Skin: Skin warm and dry Psychiatric: normal affect, pleasant  ASSESSMENT & PLAN:    Problem List Items Addressed This Visit       Cardiology Problems   Hyperlipidemia   Relevant Orders   EKG 12-Lead     Other   Anxiety   Atypical chest pain   Relevant Orders   EKG 12-Lead   Major depressive disorder, recurrent, severe without psychotic features (HCC)   PTSD (post-traumatic stress disorder)   Other Visit Diagnoses     Chest pain of uncertain etiology    -  Primary   Relevant Orders   EKG 12-Lead   Essential hypertension       Relevant Orders   EKG 12-Lead   Morbid obesity ([place])  transient cerebral ischemias - aspirin 81 mg daily Denies any episodes   HYPERTENSION, BENIGN -  Blood pressure is well controlled on today's visit. No changes made to the medications.  Hyperlipidemia Cholesterol is at goal on the current lipid regimen. No changes to the medications were made.   Atypical chest pain Prior cath clean, no further workup No chest pain discussed   Morbid  obesity due to excess calories ([place]) We have encouraged continued exercise, careful diet management in an effort to lose weight. Now that diabetic, she reports she has follow-up with Dr. Doy Hutching Consider initiation of [place] continued  stress at home,  separated from her husband, disabled child Financial stressors   Total encounter time more than 30 minutes  Greater than 50% was spent in counseling and coordination of care with the patient     Signed, Ida Rogue, [place] [id number], [place], [place] [id number]

## 2022-05-23 ENCOUNTER — Encounter: Payer: Self-pay | Admitting: Cardiovascular Disease

## 2022-05-23 ENCOUNTER — Ambulatory Visit (INDEPENDENT_AMBULATORY_CARE_PROVIDER_SITE_OTHER): Payer: Commercial Managed Care - PPO | Admitting: Cardiovascular Disease

## 2022-05-23 VITALS — BP 120/58 | HR 75 | Ht <= 58 in | Wt 275.5 lb

## 2022-05-23 DIAGNOSIS — F332 Major depressive disorder, recurrent severe without psychotic features: Secondary | ICD-10-CM

## 2022-05-23 DIAGNOSIS — F4310 Post-traumatic stress disorder, unspecified: Secondary | ICD-10-CM

## 2022-05-23 DIAGNOSIS — R079 Chest pain, unspecified: Secondary | ICD-10-CM | POA: Diagnosis not present

## 2022-05-23 DIAGNOSIS — F419 Anxiety disorder, unspecified: Secondary | ICD-10-CM

## 2022-05-23 DIAGNOSIS — I10 Essential (primary) hypertension: Secondary | ICD-10-CM | POA: Diagnosis not present

## 2022-05-23 DIAGNOSIS — R0789 Other chest pain: Secondary | ICD-10-CM | POA: Diagnosis not present

## 2022-05-23 DIAGNOSIS — E782 Mixed hyperlipidemia: Secondary | ICD-10-CM | POA: Diagnosis not present

## 2022-05-23 NOTE — Patient Instructions (Signed)
Medication Instructions:  Your physician recommends that you continue on your current medications as directed. Please refer to the Current Medication list given to you today.  *If you need a refill on your cardiac medications before your next appointment, please call your pharmacy*   Lab Work: None ordered If you have labs (blood work) drawn today and your tests are completely normal, you will receive your results only by: MyChart Message (if you have MyChart) OR A paper copy in the mail If you have any lab test that is abnormal or we need to change your treatment, we will call you to review the results.   Testing/Procedures: None ordered   Follow-Up: At [facility], you and your health needs are our priority.  As part of our continuing mission to provide you with exceptional heart care, we have created designated Provider Care Teams.  These Care Teams include your primary Cardiologist (physician) and Advanced Practice Providers (APPs -  Physician Assistants and Nurse Practitioners) who all work together to provide you with the care you need, when you need it.  We recommend signing up for the patient portal called "MyChart".  Sign up information is provided on this After Visit Summary.  MyChart is used to connect with patients for Virtual Visits (Telemedicine).  Patients are able to view lab/test results, encounter notes, upcoming appointments, etc.  Non-urgent messages can be sent to your provider as well.   To learn more about what you can do with MyChart, go to [email or web address]://[email or web address].com.    Your next appointment:   Your physician wants you to follow-up in: 1 year You will receive a reminder letter in the mail two months in advance. If you don't receive a letter, please call our office to schedule the follow-up appointment.   The format for your next appointment:   In Person  Provider:   You may see Timothy Gollan, MD or one of the following Advanced Practice Providers on your  designated Care Team:   Christopher Berge, NP Ryan Dunn, PA-C Cadence Furth, PA-C   Other Instructions N/A  Important Information About Sugar       

## 2022-05-31 HISTORY — PX: INTRAOCULAR LENS IMPLANT, SECONDARY: SHX1842

## 2022-07-05 HISTORY — PX: INTRAOCULAR LENS INSERTION: SHX110

## 2022-08-07 ENCOUNTER — Other Ambulatory Visit: Payer: Self-pay | Admitting: Cardiovascular Disease

## 2022-08-07 DIAGNOSIS — I10 Essential (primary) hypertension: Secondary | ICD-10-CM

## 2022-09-03 ENCOUNTER — Other Ambulatory Visit: Payer: Self-pay | Admitting: Cardiovascular Disease

## 2022-09-03 DIAGNOSIS — I10 Essential (primary) hypertension: Secondary | ICD-10-CM

## 2023-03-12 ENCOUNTER — Other Ambulatory Visit: Payer: Self-pay | Admitting: Cardiovascular Disease

## 2023-03-12 DIAGNOSIS — I10 Essential (primary) hypertension: Secondary | ICD-10-CM

## 2023-04-11 ENCOUNTER — Other Ambulatory Visit: Payer: Self-pay | Admitting: Internal Medicine

## 2023-04-11 DIAGNOSIS — Z1231 Encounter for screening mammogram for malignant neoplasm of breast: Secondary | ICD-10-CM

## 2023-05-26 ENCOUNTER — Ambulatory Visit
Admission: RE | Admit: 2023-05-26 | Discharge: 2023-05-26 | Disposition: A | Discharge status: Home / Self Care | Payer: Commercial Managed Care - PPO | Source: Ambulatory Visit | Attending: Internal Medicine | Admitting: Internal Medicine

## 2023-05-26 DIAGNOSIS — Z1231 Encounter for screening mammogram for malignant neoplasm of breast: Secondary | ICD-10-CM

## 2023-05-31 ENCOUNTER — Other Ambulatory Visit: Payer: Self-pay | Admitting: Internal Medicine

## 2023-05-31 DIAGNOSIS — R921 Mammographic calcification found on diagnostic imaging of breast: Secondary | ICD-10-CM

## 2023-05-31 DIAGNOSIS — R928 Other abnormal and inconclusive findings on diagnostic imaging of breast: Secondary | ICD-10-CM

## 2023-06-03 ENCOUNTER — Other Ambulatory Visit: Payer: Self-pay | Admitting: Cardiovascular Disease

## 2023-06-03 DIAGNOSIS — I10 Essential (primary) hypertension: Secondary | ICD-10-CM

## 2023-06-05 NOTE — Telephone Encounter (Signed)
Good Morning,  Could you schedule this patient a 12 month follow up? The patient was last seen by Dr. Mariah Milling on 05-23-22. Thank you so much.

## 2023-06-05 NOTE — Telephone Encounter (Signed)
Pt scheduled on 6/27

## 2023-06-07 ENCOUNTER — Ambulatory Visit
Admission: RE | Admit: 2023-06-07 | Discharge: 2023-06-07 | Disposition: A | Discharge status: Home / Self Care | Payer: Commercial Managed Care - PPO | Source: Ambulatory Visit | Attending: Internal Medicine | Admitting: Internal Medicine

## 2023-06-07 DIAGNOSIS — R921 Mammographic calcification found on diagnostic imaging of breast: Secondary | ICD-10-CM | POA: Insufficient documentation

## 2023-06-07 DIAGNOSIS — R928 Other abnormal and inconclusive findings on diagnostic imaging of breast: Secondary | ICD-10-CM | POA: Insufficient documentation

## 2023-06-07 NOTE — Progress Notes (Deleted)
Date:  06/07/2023   ID:  Elizabeth Wyatt, DOB 04/25/1956, MRN [id number]  Patient Location:  [address] [place] [address]   Provider location:   [facility], [place] office  PCP:  Elizabeth Arbour, MD  Cardiologist:  [facility]   No chief complaint on file.   History of Present Illness:    Elizabeth Wyatt is a 67 y.o. female  past medical history of morbid obesity,  hypertension,  hyperlipidemia,  chronic chest pain  cardiac catheterization May 2009   no significant coronary artery disease (circumflex coming off the ostium of the RCA)  significant stress at home, son who has autism. Separated from her husband Who presents for routine followup of her chest pain symptoms.   Last seen by myself in clinic June 2023   In follow-up reports she is doing well Underwent arthroscopic repair of meniscus August 2022 Dr. Ernest Pine Reports she has good recovery, now walking with a cane not in a wheelchair  Weight higher, poor diet Weight 275, on prior clinic visit was 262  Labs reviewed: A1C 6.8, trending higher Total chol 139, LDL 42  Echo at [place]: Normal EF Mention of mild aortic valve stenosis but gradient was very low  Continue stress at home, has high functioning son with disabilities, Elizabeth Wyatt  EKG personally reviewed by myself on todays visit NSR rate 75 poor poor R wave progression  Other past medical history reviewed Previous episode of paralysis, aphasia approximately one month ago. Woke up, could not move, could not open her eyes, could not speak. No focal deficits. She reports that her per minute was working but she could not communicate. After several hours was able to call for help   Reports having similar symptoms many years ago and was kept in the hospital at that time. 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    CT scan of the head showed no pathology She is  scheduled to have carotid ultrasound, possibly MRI, follow-up with neurology   Stress test 04/2008:  Cardiac catheter at the same time   Past Medical History:  Diagnosis Date   Allergy    Asthma    Atypical chest pain    CAD (coronary artery disease)    DDD (degenerative disc disease), lumbar    Depression    DOE (dyspnea on exertion)    Edema    Family history of malignant neoplasm of breast    GERD (gastroesophageal reflux disease)    Headache    HLD (hyperlipidemia)    HLD (hyperlipidemia)    Hypertension    Hypothyroidism    Incomplete miscarriage with blood clot    Morbid obesity with body mass index of 50.0-59.9 in adult [facility])    Myocardial infarction (HCC)    Obesity    Osteoarthritis    Personal history of colonic polyps    Personal history of tobacco use, presenting hazards to health    Pre-diabetes    Prediabetes    PTSD (post-traumatic stress disorder)    Special screening for malignant neoplasms, colon    Stress at home    issues with ex-husband; cares for son with autism   Stroke [facility])    TIA (transient ischemic attack)    Past Surgical History:  Procedure Laterality Date   BREAST BIOPSY Right 2009   BREAST BIOPSY Right 04/2018   ductal hyperplasia, cystic and papillary metaplasia, microscopic  intraducal papilloma, sclerosing adenosis   BREAST MASS EXCISION Right 2009   CARDIAC CATHETERIZATION  2010   Dr. Mariah Milling with [facility]   COLONOSCOPY  2008, 2014   [facility]   COLONOSCOPY N/A 12/22/2021   Procedure: COLONOSCOPY;  Surgeon: [place], Boykin Nearing, MD;  Location: [facility] ENDOSCOPY;  Service: Gastroenterology;  Laterality: N/A;   DILATION AND CURETTAGE OF UTERUS  2012   KNEE ARTHROSCOPY Left 07/26/2021   Procedure: ARTHROSCOPY KNEE;  Surgeon: Donato Heinz, MD;  Location: [facility] ORS;  Service: Orthopedics;  Laterality: Left;   MOUTH SURGERY     MOUTH SURGERY     UTERINE FIBROID SURGERY  2012      Allergies:   Diphenhydramine hcl, Guaifenesin, Latex, Montelukast  sodium, Pamabrom, Simvastatin, Sulfonamide derivatives, Wellbutrin [bupropion], and Quetiapine   Social History   Tobacco Use   Smoking status: Former    Packs/day: 3.00    Years: 3.00    Additional pack years: 0.00    Total pack years: 9.00    Types: Cigarettes    Quit date: 46    Years since quitting: 31.5   Smokeless tobacco: Former   Tobacco comments:    tobacco use - no   Vaping Use   Vaping Use: Never used  Substance Use Topics   Alcohol use: No    Comment: quit drinking years ago, used to drink on weekends   Drug use: No     Current Outpatient Medications on File Prior to Visit  Medication Sig Dispense Refill   acetaminophen (TYLENOL) 500 MG tablet Take 500 mg by mouth every 6 (six) hours as needed.     albuterol (PROAIR HFA) 108 (90 Base) MCG/ACT inhaler Inhale 2 puffs into the lungs every 4 (four) hours as needed for wheezing or shortness of breath. 8.5 Inhaler 3   aspirin EC 81 MG tablet Take 1 tablet (81 mg total) by mouth daily.     atorvastatin (LIPITOR) 10 MG tablet Take 1 tablet (10 mg total) by mouth daily. 90 tablet 0   cetirizine (ZYRTEC) 10 MG tablet Take 10 mg by mouth as needed.     Chlorhexidine Gluconate (DYNA-HEX 4) 4 % SOLN Wash daily at affected area as needed. 118 mL 1   DULoxetine (CYMBALTA) 60 MG capsule Take 60 mg by mouth 2 (two) times daily.     fluconazole (DIFLUCAN) 100 MG tablet Take two tablets on Day 1. Then 1 tablet daily for 14 days. 15 tablet 0   fluconazole (DIFLUCAN) 200 MG tablet TAKE 1 TABLET (200 MG TOTAL) BY MOUTH ONCE A WEEK. FOR MAINTENANCE 4 tablet 11   fluticasone (FLOVENT HFA) 110 MCG/ACT inhaler Inhale 2 puffs into the lungs 2 (two) times a day. 12 g 5   gabapentin (NEURONTIN) 100 MG capsule Take 3 capsules (300 mg total) by mouth at bedtime. 270 capsule 0   levothyroxine (SYNTHROID) 50 MCG tablet Take 1 tablet (50 mcg total) by mouth daily. 90 tablet 0   meclizine (ANTIVERT) 25 MG tablet Take 25 mg by mouth 3 (three) times  daily as needed for dizziness. (Patient not taking: Reported on 12/22/2021)     Naproxen Sodium (ALEVE PO) Take by mouth.     nebivolol (BYSTOLIC) 10 MG tablet TAKE 1 TABLET BY MOUTH EVERY DAY 30 tablet 7   nitroGLYCERIN (NITROSTAT) 0.4 MG SL tablet Place 1 tablet (0.4 mg total) under the tongue every 5 (five) minutes as needed. 45 tablet 3   nystatin (MYCOSTATIN/NYSTOP) powder ONE APPLICATION TWICE A  DAY TO RASH FOR UP TO 1-2 WEEKS AS NEEDED 45 g 2   nystatin cream (MYCOSTATIN) Apply 1 application topically 2 (two) times daily. Use for up to 2 weeks until rash disappears. 30 g 1   omeprazole (PRILOSEC) 20 MG capsule Take 1 capsule (20 mg total) by mouth daily before breakfast. 90 capsule 0   topiramate (TOPAMAX) 50 MG tablet Take 50 mg by mouth 2 (two) times daily.     verapamil (VERELAN PM) 240 MG 24 hr capsule Take 1 capsule (240 mg total) by mouth at bedtime. Please schedule office visit for further refills. Thank you! 30 capsule 2   No current facility-administered medications on file prior to visit.     Family Hx: The patient's family history includes Breast cancer (age of onset: 86) in her mother; Cancer in her maternal grandmother and mother.  ROS:   Please see the history of present illness.    Review of Systems  Constitutional: Negative.   HENT: Negative.    Respiratory: Negative.    Cardiovascular: Negative.   Gastrointestinal: Negative.   Musculoskeletal:  Positive for joint pain.  Neurological: Negative.   Psychiatric/Behavioral: Negative.    All other systems reviewed and are negative.    Labs/Other Tests and Data Reviewed:    Recent Labs: No results found for requested labs within last 365 days.   Recent Lipid Panel Lab Results  Component Value Date/Time   CHOL 164 04/15/2016 12:00 PM   TRIG 128 04/15/2016 12:00 PM   HDL 94 04/15/2016 12:00 PM   CHOLHDL 1.7 04/15/2016 12:00 PM   CHOLHDL 3 10/24/2013 08:56 AM   LDLCALC 44 04/15/2016 12:00 PM   LDLDIRECT 123.0  10/24/2013 08:56 AM    Wt Readings from Last 3 Encounters:  05/23/22 275 lb 8 oz (125 kg)  12/22/21 269 lb (122 kg)  11/02/21 267 lb 12.8 oz (121.5 kg)     Exam:    There were no vitals taken for this visit. Constitutional:  oriented to person, place, and time. No distress.  HENT:  Head: Grossly normal Eyes:  no discharge. No scleral icterus.  Neck: No JVD, no carotid bruits  Cardiovascular: Regular rate and rhythm, no murmurs appreciated Pulmonary/Chest: Clear to auscultation bilaterally, no wheezes or rails Abdominal: Soft.  no distension.  no tenderness.  Musculoskeletal: Normal range of motion Neurological:  normal muscle tone. Coordination normal. No atrophy Skin: Skin warm and dry Psychiatric: normal affect, pleasant  ASSESSMENT & PLAN:    Problem List Items Addressed This Visit   None transient cerebral ischemias - aspirin 81 mg daily Denies any episodes   HYPERTENSION, BENIGN -  Blood pressure is well controlled on today's visit. No changes made to the medications.  Hyperlipidemia Cholesterol is at goal on the current lipid regimen. No changes to the medications were made.   Atypical chest pain Prior cath clean, no further workup No chest pain discussed   Morbid obesity due to excess calories (HCC) We have encouraged continued exercise, careful diet management in an effort to lose weight. Now that diabetic, she reports she has follow-up with Dr. Judithann Sheen Consider initiation of Ozempic  ADJ DISORDER WITH MIXED ANXIETY & DEPRESSED MOOD continued  stress at home,  separated from her husband, disabled child Financial stressors   Total encounter time more than 30 minutes  Greater than 50% was spent in counseling and coordination of care with the patient     Signed, Julien Nordmann, MD  [facility]  [employer] [address], [place], [place] [address]

## 2023-06-08 ENCOUNTER — Ambulatory Visit: Payer: Commercial Managed Care - PPO | Admitting: Cardiovascular Disease

## 2023-06-08 DIAGNOSIS — R079 Chest pain, unspecified: Secondary | ICD-10-CM

## 2023-06-08 DIAGNOSIS — R0789 Other chest pain: Secondary | ICD-10-CM

## 2023-06-08 DIAGNOSIS — E782 Mixed hyperlipidemia: Secondary | ICD-10-CM

## 2023-06-08 DIAGNOSIS — F419 Anxiety disorder, unspecified: Secondary | ICD-10-CM

## 2023-06-08 DIAGNOSIS — F4310 Post-traumatic stress disorder, unspecified: Secondary | ICD-10-CM

## 2023-06-08 DIAGNOSIS — I10 Essential (primary) hypertension: Secondary | ICD-10-CM

## 2023-06-08 DIAGNOSIS — R7303 Prediabetes: Secondary | ICD-10-CM

## 2023-06-11 NOTE — Progress Notes (Unsigned)
Date:  06/12/2023   ID:  Elizabeth Wyatt, DOB 02-13-1956, MRN [id number]  Patient Location:  [address] [place] [address]   Provider location:   [facility], [place] office  PCP:  Marguarite Arbour, MD  Cardiologist:  [facility]   Chief Complaint  Patient presents with   12 month follow up     "Doing well." Medications reviewed by the patient verbally.     History of Present Illness:    Elizabeth Wyatt is a 67 y.o. female  past medical history of morbid obesity,  hypertension,  hyperlipidemia,  chronic chest pain  cardiac catheterization May 2009   no significant coronary artery disease (circumflex coming off the ostium of the RCA)  significant stress at home, son who has autism. Separated from her husband Who presents for routine followup of her chest pain symptoms.   Last seen by myself in clinic June 2023  In follow-up today reports feeling relatively well Chronic arthritides Unable to ambulate very far Left leg swelling, left knee pain, has bakers cyst  Weight higher Weight 281 Underwent arthroscopic repair of meniscus August 2022 Dr. Ernest Pine  Has not been checking blood pressure at home  Labs reviewed: A1C 7.0 Total chol 149 LDL 46  Echo at [place]: Normal EF Mention of mild aortic valve stenosis but gradient was very low  EKG personally reviewed by myself on todays visit EKG Interpretation Date/Time:  Monday June 12 2023 10:46:08 EDT Ventricular Rate:  67 PR Interval:  188 QRS Duration:  96 QT Interval:  394 QTC Calculation: 416 R Axis:   6  Text Interpretation: Normal sinus rhythm When compared with ECG of 24-Jan-2011 11:16, Criteria for Anterior infarct are no longer Present Nonspecific T wave abnormality no longer evident in Anterior leads Confirmed by Julien Nordmann [phone]) on 06/12/2023 11:09:40 AM   Other past medical history reviewed Previous episode of paralysis, aphasia approximately one month  ago. Woke up, could not move, could not open her eyes, could not speak. No focal deficits. She reports that her per minute was working but she could not communicate. After several hours was able to call for help   Reports having similar symptoms many years ago and was kept in the hospital at that time. 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    CT scan of the head showed no pathology She is scheduled to have carotid ultrasound, possibly MRI, follow-up with neurology   Stress test 04/2008:  Cardiac catheter at the same time   Past Medical History:  Diagnosis Date   Allergy    Asthma    Atypical chest pain    CAD (coronary artery disease)    DDD (degenerative disc disease), lumbar    Depression    DOE (dyspnea on exertion)    Edema    Family history of malignant neoplasm of breast    GERD (gastroesophageal reflux disease)    Headache    HLD (hyperlipidemia)    HLD (hyperlipidemia)    Hypertension    Hypothyroidism    Incomplete miscarriage with blood clot    Morbid obesity with body mass index of 50.0-59.9 in adult [facility])    Myocardial infarction (HCC)    Obesity    Osteoarthritis    Personal history of colonic polyps    Personal history of tobacco use, presenting hazards to health    Pre-diabetes  Prediabetes    PTSD (post-traumatic stress disorder)    Special screening for malignant neoplasms, colon    Stress at home    issues with ex-husband; cares for son with autism   Stroke [facility])    TIA (transient ischemic attack)    Past Surgical History:  Procedure Laterality Date   BREAST BIOPSY Right 2009   BREAST BIOPSY Right 04/2018   ductal hyperplasia, cystic and papillary metaplasia, microscopic intraducal papilloma, sclerosing adenosis   BREAST MASS EXCISION Right 2009   CARDIAC CATHETERIZATION  2010   Dr. Mariah Milling with [place]   COLONOSCOPY  2008, 2014   [facility]   COLONOSCOPY N/A 12/22/2021   Procedure: COLONOSCOPY;   Surgeon: Toledo, Boykin Nearing, MD;  Location: [facility] ENDOSCOPY;  Service: Gastroenterology;  Laterality: N/A;   DILATION AND CURETTAGE OF UTERUS  2012   KNEE ARTHROSCOPY Left 07/26/2021   Procedure: ARTHROSCOPY KNEE;  Surgeon: Donato Heinz, MD;  Location: [facility] ORS;  Service: Orthopedics;  Laterality: Left;   MOUTH SURGERY     MOUTH SURGERY     UTERINE FIBROID SURGERY  2012     Allergies:   Diphenhydramine hcl, Guaifenesin, Latex, Montelukast sodium, Pamabrom, Simvastatin, Sulfonamide derivatives, Wellbutrin [bupropion], and Quetiapine   Social History   Tobacco Use   Smoking status: Former    Packs/day: 3.00    Years: 3.00    Additional pack years: 0.00    Total pack years: 9.00    Types: Cigarettes    Quit date: 54    Years since quitting: 31.5   Smokeless tobacco: Former   Tobacco comments:    tobacco use - no   Vaping Use   Vaping Use: Never used  Substance Use Topics   Alcohol use: No    Comment: quit drinking years ago, used to drink on weekends   Drug use: No     Current Outpatient Medications on File Prior to Visit  Medication Sig Dispense Refill   acetaminophen (TYLENOL) 500 MG tablet Take 500 mg by mouth every 6 (six) hours as needed.     albuterol (PROAIR HFA) 108 (90 Base) MCG/ACT inhaler Inhale 2 puffs into the lungs every 4 (four) hours as needed for wheezing or shortness of breath. 8.5 Inhaler 3   ARIPiprazole (ABILIFY) 5 MG tablet Take 5 mg by mouth daily.     aspirin EC 81 MG tablet Take 1 tablet (81 mg total) by mouth daily.     cetirizine (ZYRTEC) 10 MG tablet Take 10 mg by mouth as needed.     DULoxetine (CYMBALTA) 60 MG capsule Take 60 mg by mouth 2 (two) times daily.     fluconazole (DIFLUCAN) 200 MG tablet TAKE 1 TABLET (200 MG TOTAL) BY MOUTH ONCE A WEEK. FOR MAINTENANCE 4 tablet 11   fluticasone (FLOVENT HFA) 110 MCG/ACT inhaler Inhale 2 puffs into the lungs 2 (two) times a day. 12 g 5   gabapentin (NEURONTIN) 100 MG capsule Take 3 capsules (300 mg  total) by mouth at bedtime. 270 capsule 0   levothyroxine (SYNTHROID) 50 MCG tablet Take 1 tablet (50 mcg total) by mouth daily. 90 tablet 0   meclizine (ANTIVERT) 25 MG tablet Take 25 mg by mouth 3 (three) times daily as needed for dizziness.     Naproxen Sodium (ALEVE PO) Take by mouth.     nitroGLYCERIN (NITROSTAT) 0.4 MG SL tablet Place 1 tablet (0.4 mg total) under the tongue every 5 (five) minutes as needed. 45 tablet 3   nystatin (  MYCOSTATIN/NYSTOP) powder ONE APPLICATION TWICE A DAY TO RASH FOR UP TO 1-2 WEEKS AS NEEDED 45 g 2   nystatin cream (MYCOSTATIN) Apply 1 application topically 2 (two) times daily. Use for up to 2 weeks until rash disappears. 30 g 1   omeprazole (PRILOSEC) 20 MG capsule Take 1 capsule (20 mg total) by mouth daily before breakfast. 90 capsule 0   topiramate (TOPAMAX) 50 MG tablet Take 50 mg by mouth 2 (two) times daily.     No current facility-administered medications on file prior to visit.     Family Hx: The patient's family history includes Breast cancer (age of onset: 85) in her mother; Cancer in her maternal grandmother and mother.  ROS:   Please see the history of present illness.    Review of Systems  Constitutional: Negative.   HENT: Negative.    Respiratory: Negative.    Cardiovascular: Negative.   Gastrointestinal: Negative.   Musculoskeletal:  Positive for joint pain.  Neurological: Negative.   Psychiatric/Behavioral: Negative.    All other systems reviewed and are negative.    Labs/Other Tests and Data Reviewed:    Recent Labs: No results found for requested labs within last 365 days.   Recent Lipid Panel Lab Results  Component Value Date/Time   CHOL 164 04/15/2016 12:00 PM   TRIG 128 04/15/2016 12:00 PM   HDL 94 04/15/2016 12:00 PM   CHOLHDL 1.7 04/15/2016 12:00 PM   CHOLHDL 3 10/24/2013 08:56 AM   LDLCALC 44 04/15/2016 12:00 PM   LDLDIRECT 123.0 10/24/2013 08:56 AM    Wt Readings from Last 3 Encounters:  06/12/23 281 lb  (127.5 kg)  05/23/22 275 lb 8 oz (125 kg)  12/22/21 269 lb (122 kg)     Exam:    BP (!) 145/70 (BP Location: Left Arm)   Pulse 67   Ht 4\' 10"  (1.473 m)   Wt 281 lb (127.5 kg)   SpO2 96%   BMI 58.73 kg/m  Constitutional:  oriented to person, place, and time. No distress. obese HENT:  Head: Grossly normal Eyes:  no discharge. No scleral icterus.  Neck: No JVD, no carotid bruits  Cardiovascular: Regular rate and rhythm, no murmurs appreciated Pulmonary/Chest: Clear to auscultation bilaterally, no wheezes or rails Abdominal: Soft.  no distension.  no tenderness.  Musculoskeletal: Normal range of motion Neurological:  normal muscle tone. Coordination normal. No atrophy Skin: Skin warm and dry Psychiatric: normal affect, pleasant  ASSESSMENT & PLAN:    Problem List Items Addressed This Visit       Cardiology Problems   Hyperlipidemia   Relevant Medications   atorvastatin (LIPITOR) 10 MG tablet   verapamil (VERELAN) 240 MG 24 hr capsule   nebivolol (BYSTOLIC) 10 MG tablet     Other   Prediabetes   PTSD (post-traumatic stress disorder)   Other Visit Diagnoses     Chest pain of uncertain etiology    -  Primary   Relevant Orders   EKG 12-Lead (Completed)   Essential hypertension       Relevant Medications   atorvastatin (LIPITOR) 10 MG tablet   verapamil (VERELAN) 240 MG 24 hr capsule   nebivolol (BYSTOLIC) 10 MG tablet   Other Relevant Orders   EKG 12-Lead (Completed)   Morbid obesity (HCC)       HYPERTENSION, BENIGN       Relevant Medications   atorvastatin (LIPITOR) 10 MG tablet   verapamil (VERELAN) 240 MG 24 hr capsule   nebivolol (BYSTOLIC)  10 MG tablet     transient cerebral ischemias - aspirin 81 mg daily No episodes   HYPERTENSION, BENIGN -  Not checking at home No changes made to the medications. Recommend she monitor blood pressure at home and call us with numbers  Hyperlipidemia Cholesterol is at goal on the current lipid regimen. No changes  to the medications were made.   Atypical chest pain Prior cath clean, no further workup No chest pain discussed on today's visit   Morbid obesity due to excess calories (HCC) We have encouraged continued exercise, careful diet management in an effort to lose weight. Consider initiation of Ozempic  ADJ DISORDER WITH MIXED ANXIETY & DEPRESSED MOOD separated from her husband, disabled child Financial stressors   Total encounter time more than 30 minutes  Greater than 50% was spent in counseling and coordination of care with the patient     Signed, Julien Nordmann, MD  [facility] [address], [place], [place] [address]

## 2023-06-12 ENCOUNTER — Encounter: Payer: Self-pay | Admitting: Cardiovascular Disease

## 2023-06-12 ENCOUNTER — Other Ambulatory Visit: Payer: Self-pay | Admitting: Internal Medicine

## 2023-06-12 ENCOUNTER — Ambulatory Visit: Payer: Commercial Managed Care - PPO | Attending: Cardiovascular Disease | Admitting: Cardiovascular Disease

## 2023-06-12 VITALS — BP 145/70 | HR 67 | Ht <= 58 in | Wt 281.0 lb

## 2023-06-12 DIAGNOSIS — F4310 Post-traumatic stress disorder, unspecified: Secondary | ICD-10-CM

## 2023-06-12 DIAGNOSIS — R079 Chest pain, unspecified: Secondary | ICD-10-CM | POA: Diagnosis not present

## 2023-06-12 DIAGNOSIS — R921 Mammographic calcification found on diagnostic imaging of breast: Secondary | ICD-10-CM

## 2023-06-12 DIAGNOSIS — I10 Essential (primary) hypertension: Secondary | ICD-10-CM | POA: Diagnosis not present

## 2023-06-12 DIAGNOSIS — E785 Hyperlipidemia, unspecified: Secondary | ICD-10-CM

## 2023-06-12 DIAGNOSIS — R928 Other abnormal and inconclusive findings on diagnostic imaging of breast: Secondary | ICD-10-CM

## 2023-06-12 DIAGNOSIS — E782 Mixed hyperlipidemia: Secondary | ICD-10-CM

## 2023-06-12 DIAGNOSIS — R7303 Prediabetes: Secondary | ICD-10-CM

## 2023-06-12 MED ORDER — ATORVASTATIN CALCIUM 10 MG PO TABS: 10.0000 mg | ORAL_TABLET | Freq: Every day | ORAL | 3 refills | Status: DC

## 2023-06-12 MED ORDER — NEBIVOLOL HCL 10 MG PO TABS: 10.0000 mg | ORAL_TABLET | Freq: Every day | ORAL | 3 refills | Status: DC

## 2023-06-12 MED ORDER — VERAPAMIL HCL ER 240 MG PO CP24: 240.0000 mg | ORAL_CAPSULE | Freq: Every day | ORAL | 3 refills | Status: DC

## 2023-06-12 NOTE — Patient Instructions (Signed)
Medication Instructions:  No changes  If you need a refill on your cardiac medications before your next appointment, please call your pharmacy.   Lab work: No new labs needed  Testing/Procedures: No new testing needed  Follow-Up: At [facility], you and your health needs are our priority.  As part of our continuing mission to provide you with exceptional heart care, we have created designated Provider Care Teams.  These Care Teams include your primary Cardiologist (physician) and Advanced Practice Providers (APPs -  Physician Assistants and Nurse Practitioners) who all work together to provide you with the care you need, when you need it.  You will need a follow up appointment in 12 months  Providers on your designated Care Team:   Christopher Berge, NP Ryan Dunn, PA-C Cadence Furth, PA-C  COVID-19 Vaccine Information can be found at: [email or web address] For questions related to vaccine distribution or appointments, please email [email or web address] or call [phone].   

## 2023-06-14 ENCOUNTER — Ambulatory Visit
Admission: RE | Admit: 2023-06-14 | Discharge: 2023-06-14 | Disposition: A | Discharge status: Home / Self Care | Payer: Commercial Managed Care - PPO | Source: Ambulatory Visit | Attending: Internal Medicine | Admitting: Internal Medicine

## 2023-06-14 DIAGNOSIS — R921 Mammographic calcification found on diagnostic imaging of breast: Secondary | ICD-10-CM | POA: Insufficient documentation

## 2023-06-14 DIAGNOSIS — R928 Other abnormal and inconclusive findings on diagnostic imaging of breast: Secondary | ICD-10-CM | POA: Insufficient documentation

## 2023-06-14 HISTORY — PX: BREAST BIOPSY: SHX20

## 2023-06-14 MED ORDER — LIDOCAINE 1 % OPTIME INJ - NO CHARGE
5.0000 mL | Freq: Once | INTRAMUSCULAR | Status: AC
  Administered 2023-06-14: 5 mL
  Filled 2023-06-14: qty 6

## 2023-06-14 MED ORDER — LIDOCAINE-EPINEPHRINE 1 %-1:100000 IJ SOLN
20.0000 mL | Freq: Once | INTRAMUSCULAR | Status: AC
  Administered 2023-06-14: 20 mL
  Filled 2023-06-14: qty 20
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
## 2023-07-04 ENCOUNTER — Ambulatory Visit (INDEPENDENT_AMBULATORY_CARE_PROVIDER_SITE_OTHER): Payer: Commercial Managed Care - PPO | Admitting: Obstetrics and Gynecology

## 2023-07-04 ENCOUNTER — Encounter: Payer: Self-pay | Admitting: Obstetrics and Gynecology

## 2023-07-04 VITALS — BP 122/55 | HR 70 | Resp 16 | Ht <= 58 in | Wt 282.8 lb

## 2023-07-04 DIAGNOSIS — I10 Essential (primary) hypertension: Secondary | ICD-10-CM

## 2023-07-04 DIAGNOSIS — E034 Atrophy of thyroid (acquired): Secondary | ICD-10-CM

## 2023-07-04 DIAGNOSIS — R7303 Prediabetes: Secondary | ICD-10-CM

## 2023-07-04 DIAGNOSIS — Z01419 Encounter for gynecological examination (general) (routine) without abnormal findings: Secondary | ICD-10-CM

## 2023-07-04 DIAGNOSIS — B372 Candidiasis of skin and nail: Secondary | ICD-10-CM

## 2023-07-04 MED ORDER — NYSTATIN-TRIAMCINOLONE 100000-0.1 UNIT/GM-% EX OINT: 1.0000 | TOPICAL_OINTMENT | Freq: Two times a day (BID) | CUTANEOUS | 1 refills | Status: AC

## 2023-08-31 ENCOUNTER — Telehealth: Payer: Self-pay

## 2023-08-31 MED ORDER — NYSTATIN 100000 UNIT/GM EX CREA: 1.0000 | TOPICAL_CREAM | Freq: Two times a day (BID) | CUTANEOUS | 1 refills | Status: DC

## 2023-08-31 NOTE — Telephone Encounter (Signed)
Pt calling; has a heat rash under her breasts and on chest area.  In the past when she would have this [facility] would rx medication.  Would like that medication sent to [facility].  [phone]

## 2023-08-31 NOTE — Telephone Encounter (Signed)
Medication has been sent to patients preferred pharmacy

## 2023-09-01 NOTE — Telephone Encounter (Signed)
Called pt, no answer, left detailed msg Rx sent to preferred pharmacy.

## 2023-11-14 ENCOUNTER — Other Ambulatory Visit: Payer: Self-pay | Admitting: Obstetrics and Gynecology

## 2024-02-12 ENCOUNTER — Other Ambulatory Visit: Payer: Self-pay | Admitting: Physician Assistant

## 2024-02-12 DIAGNOSIS — R251 Tremor, unspecified: Secondary | ICD-10-CM

## 2024-02-12 DIAGNOSIS — R2689 Other abnormalities of gait and mobility: Secondary | ICD-10-CM

## 2024-03-05 ENCOUNTER — Encounter: Payer: Self-pay | Admitting: Physician Assistant

## 2024-03-08 ENCOUNTER — Ambulatory Visit
Admission: RE | Admit: 2024-03-08 | Discharge: 2024-03-08 | Disposition: A | Discharge status: Home / Self Care | Source: Ambulatory Visit | Attending: Physician Assistant | Admitting: Physician Assistant

## 2024-03-08 DIAGNOSIS — R2689 Other abnormalities of gait and mobility: Secondary | ICD-10-CM

## 2024-03-08 DIAGNOSIS — R251 Tremor, unspecified: Secondary | ICD-10-CM

## 2024-04-17 ENCOUNTER — Other Ambulatory Visit: Payer: Self-pay | Admitting: Internal Medicine

## 2024-04-17 DIAGNOSIS — Z1231 Encounter for screening mammogram for malignant neoplasm of breast: Secondary | ICD-10-CM

## 2024-05-27 ENCOUNTER — Ambulatory Visit
Admission: RE | Admit: 2024-05-27 | Discharge: 2024-05-27 | Disposition: A | Discharge status: Home / Self Care | Source: Ambulatory Visit | Attending: Internal Medicine | Admitting: Internal Medicine

## 2024-05-27 DIAGNOSIS — Z1231 Encounter for screening mammogram for malignant neoplasm of breast: Secondary | ICD-10-CM | POA: Diagnosis present

## 2024-06-17 NOTE — Progress Notes (Unsigned)
 Date:  06/18/2024   ID:  Elizabeth Wyatt, DOB 01-Jul-1956, MRN [id number]  Patient Location:  [address] [place] [address]   Provider location:   [facility], [place] office  PCP:  Auston Reyes BIRCH, MD  Cardiologist:  Perla MOCCASIN [facility]   Chief Complaint  Patient presents with   12 month follow up     Doing well.     History of Present Illness:    Elizabeth Wyatt is a 68 y.o. female  past medical history of morbid obesity,  hypertension,  hyperlipidemia,  chronic chest pain  cardiac catheterization May 2009   no significant coronary artery disease (circumflex coming off the ostium of the RCA)  significant stress at home, son who has autism. Separated from her husband Who presents for routine followup of her chest pain symptoms.   Last seen by myself in clinic June 2024 Presents today in a wheelchair In follow-up today, reports changing her diet, following portion control Weight is down 22 pounds from her prior clinic visit July 2024  Blood pressure running low, occasional lightheadedness Remains on verapamil  ER 240 daily, bystolic  10 daily Denies significant tachycardia or palpitations  Chronic arthritides  arthroscopic repair of meniscus August 2022 Dr. Mardee  Labs reviewed: A1C 6.3 Total chol 115, LDL 38 Creatinine 1.1  Echo at [place]: Normal EF Mention of mild aortic valve stenosis but gradient was very low  EKG personally reviewed by myself on todays visit EKG Interpretation Date/Time:  Tuesday June 18 2024 15:12:25 EDT Ventricular Rate:  68 PR Interval:  184 QRS Duration:  104 QT Interval:  424 QTC Calculation: 450 R Axis:   -6  Text Interpretation: Normal sinus rhythm Minimal voltage criteria for LVH, may be normal variant ( Cornell product ) Poor R wave progression When compared with ECG of 12-Jun-2023 10:46, No significant change was found Confirmed by Perla Lye [phone]) on 06/18/2024 3:23:07 PM   Other  past medical history reviewed Previous episode of paralysis, aphasia approximately one month ago. Woke up, could not move, could not open her eyes, could not speak. No focal deficits. She reports that her per minute was working but she could not communicate. After several hours was able to call for help   Reports having similar symptoms many years ago and was kept in the hospital at that time. 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    CT scan of the head showed no pathology She is scheduled to have carotid ultrasound, possibly MRI, follow-up with neurology   Stress test 04/2008:  Cardiac catheter at the same time   Past Medical History:  Diagnosis Date   Allergy    Asthma    Atypical chest pain    CAD (coronary artery disease)    DDD (degenerative disc disease), lumbar    Depression    DOE (dyspnea on exertion)    Edema    Family history of malignant neoplasm of breast    GERD (gastroesophageal reflux disease)    Headache    HLD (hyperlipidemia)    HLD (hyperlipidemia)    Hypertension    Hypothyroidism    Incomplete miscarriage with blood clot    Morbid obesity with body mass index of 50.0-59.9 in adult [facility])    Myocardial infarction (HCC)    Obesity    Osteoarthritis    Personal history of colonic polyps    Personal history of  tobacco use, presenting hazards to health    Pre-diabetes    Prediabetes    PTSD (post-traumatic stress disorder)    Special screening for malignant neoplasms, colon    Stress at home    issues with ex-husband; cares for son with autism   Stroke [facility])    TIA (transient ischemic attack)    Past Surgical History:  Procedure Laterality Date   BREAST BIOPSY Right 2009   BREAST BIOPSY Right 04/2018   ductal hyperplasia, cystic and papillary metaplasia, microscopic intraducal papilloma, sclerosing adenosis   BREAST BIOPSY Left 06/14/2023   stereo bx, calcs, COIL clip-path pending   BREAST BIOPSY  Left 06/14/2023   MM LT BREAST BX W LOC DEV 1ST LESION IMAGE BX SPEC STEREO GUIDE 06/14/2023 [facility]   BREAST MASS EXCISION Right 2009   CARDIAC CATHETERIZATION  2010   Dr. Perla with [facility]   COLONOSCOPY  2008, 2014   [facility]   COLONOSCOPY N/A 12/22/2021   Procedure: COLONOSCOPY;  Surgeon: Toledo, Ladell POUR, MD;  Location: [facility] END[facility]COPY;  Service: Gastroenterology;  Laterality: N/A;   DILATION AND CURETTAGE OF UTERUS  2012   INTRAOCULAR LENS IMPLANT, SECONDARY Right 05/31/2022   INTRAOCULAR LENS INSERTION Bilateral 07/05/2022   left   KNEE ARTHROSCOPY Left 07/26/2021   Procedure: ARTHROSCOPY KNEE;  Surgeon: Mardee Lynwood SQUIBB, MD;  Location: [facility] ORS;  Service: Orthopedics;  Laterality: Left;   MOUTH SURGERY     MOUTH SURGERY     UTERINE FIBROID SURGERY  2012     Allergies:   Diphenhydramine hcl, Erythromycin, Sulfonamide derivatives, Guaifenesin, Latex, Montelukast sodium, Pamabrom, Simvastatin , Wellbutrin [bupropion], and Quetiapine   Social History   Tobacco Use   Smoking status: Former    Current packs/day: 0.00    Average packs/day: 3.0 packs/day for 3.0 years (9.0 ttl pk-yrs)    Types: Cigarettes    Start date: 58    Quit date: 29    Years since quitting: 32.5   Smokeless tobacco: Former   Tobacco comments:    tobacco use - no   Vaping Use   Vaping status: Never Used  Substance Use Topics   Alcohol use: No    Comment: quit drinking years ago, used to drink on weekends   Drug use: No     Current Outpatient Medications on File Prior to Visit  Medication Sig Dispense Refill   acetaminophen  (TYLENOL ) 500 MG tablet Take 500 mg by mouth every 6 (six) hours as needed.     albuterol  (PROAIR  HFA) 108 (90 Base) MCG/ACT inhaler Inhale 2 puffs into the lungs every 4 (four) hours as needed for wheezing or shortness of breath. 8.5 Inhaler 3   ARIPiprazole (ABILIFY) 5 MG tablet Take 5 mg by mouth daily.     aspirin  EC 81 MG tablet Take 1 tablet (81 mg total) by mouth  daily.     atorvastatin  (LIPITOR) 10 MG tablet Take 1 tablet (10 mg total) by mouth daily. 90 tablet 3   cetirizine (ZYRTEC) 10 MG tablet Take 10 mg by mouth as needed.     DULoxetine  (CYMBALTA ) 60 MG capsule Take 60 mg by mouth 2 (two) times daily.     fluconazole  (DIFLUCAN ) 200 MG tablet TAKE 1 TABLET (200 MG TOTAL) BY MOUTH ONCE A WEEK. FOR MAINTENANCE 4 tablet 11   fluticasone  (FLOVENT  HFA) 110 MCG/ACT inhaler Inhale 2 puffs into the lungs 2 (two) times a day. 12 g 5   gabapentin  (NEURONTIN ) 100 MG capsule Take 3 capsules (300  mg total) by mouth at bedtime. 270 capsule 0   levothyroxine  (SYNTHROID ) 50 MCG tablet Take 1 tablet (50 mcg total) by mouth daily. 90 tablet 0   meclizine  (ANTIVERT ) 25 MG tablet Take 25 mg by mouth 3 (three) times daily as needed for dizziness.     Naproxen Sodium (ALEVE PO) Take by mouth.     nebivolol  (BYSTOLIC ) 10 MG tablet Take 1 tablet (10 mg total) by mouth daily. 90 tablet 3   nitroGLYCERIN  (NITROSTAT ) 0.4 MG SL tablet Place 1 tablet (0.4 mg total) under the tongue every 5 (five) minutes as needed. 45 tablet 3   nystatin  (MYCOSTATIN /NYSTOP ) powder ONE APPLICATION TWICE A DAY TO RASH FOR UP TO 1-2 WEEKS AS NEEDED 45 g 2   nystatin  cream (MYCOSTATIN ) APPLY 1 APPLICATION TOPICALLY 2 (TWO) TIMES DAILY. TO AFFECTED AREA 30 g 1   nystatin -triamcinolone  ointment (MYCOLOG) Apply 1 Application topically 2 (two) times daily. Apply to thigh and groin region twice weekly for 2 weeks. 30 g 1   omeprazole  (PRILOSEC ) 20 MG capsule Take 1 capsule (20 mg total) by mouth daily before breakfast. 90 capsule 0   topiramate  (TOPAMAX ) 50 MG tablet Take 50 mg by mouth 2 (two) times daily.     verapamil  (VERELAN ) 240 MG 24 hr capsule Take 1 capsule (240 mg total) by mouth at bedtime. 90 capsule 3   No current facility-administered medications on file prior to visit.     Family Hx: The patient's family history includes Breast cancer (age of onset: 63) in her mother; Cancer in her  maternal grandmother and mother.  ROS:   Please see the history of present illness.    Review of Systems  Constitutional: Negative.   HENT: Negative.    Respiratory: Negative.    Cardiovascular: Negative.   Gastrointestinal: Negative.   Musculoskeletal:  Positive for joint pain.  Neurological: Negative.   Psychiatric/Behavioral: Negative.    All other systems reviewed and are negative.    Labs/Other Tests and Data Reviewed:    Recent Labs: No results found for requested labs within last 365 days.   Recent Lipid Panel Lab Results  Component Value Date/Time   CHOL 164 04/15/2016 12:00 PM   TRIG 128 04/15/2016 12:00 PM   HDL 94 04/15/2016 12:00 PM   CHOLHDL 1.7 04/15/2016 12:00 PM   CHOLHDL 3 10/24/2013 08:56 AM   LDLCALC 44 04/15/2016 12:00 PM   LDLDIRECT 123.0 10/24/2013 08:56 AM    Wt Readings from Last 3 Encounters:  06/18/24 260 lb 8 oz (118.2 kg)  07/04/23 282 lb 12.8 oz (128.3 kg)  06/12/23 281 lb (127.5 kg)     Exam:    BP 100/60 (BP Location: Left Wrist, Patient Position: Sitting, Cuff Size: Normal)   Pulse 68   Ht 4' 10 (1.473 m)   Wt 260 lb 8 oz (118.2 kg)   SpO2 97%   BMI 54.44 kg/m  Constitutional:  oriented to person, place, and time. No distress.  HENT:  Head: Grossly normal Eyes:  no discharge. No scleral icterus.  Neck: No JVD, no carotid bruits  Cardiovascular: Regular rate and rhythm, no murmurs appreciated Pulmonary/Chest: Clear to auscultation bilaterally, no wheezes or rales Abdominal: Soft.  no distension.  no tenderness.  Musculoskeletal: Normal range of motion Neurological:  normal muscle tone. Coordination normal. No atrophy Skin: Skin warm and dry Psychiatric: normal affect, pleasant   ASSESSMENT & PLAN:    Problem List Items Addressed This Visit  Cardiology Problems   Hyperlipidemia     Other   Prediabetes   Other Visit Diagnoses       Chest pain of uncertain etiology    -  Primary   Relevant Orders   EKG  12-Lead (Completed)     Essential hypertension       Relevant Orders   EKG 12-Lead (Completed)     Morbid obesity (HCC)          transient cerebral ischemias - aspirin  81 mg daily, no recent episodes   HYPERTENSION, BENIGN -  Blood pressure running low likely exacerbated by 22 pound weight loss in the past year Recommend she stay on bystolic  Decrease verapamil  ER down to 180 daily Continue to monitor blood pressure.  If numbers run low, bystolic  or verapamil  dosing could be decreased further  Hyperlipidemia Cholesterol is at goal on the current lipid regimen. No changes to the medications were made.   Atypical chest pain Prior cath clean, no further workup Denies significant chest pain on today's visit   Morbid obesity due to excess calories (HCC) Congratulated her on 22 pound weight loss, huge accomplishment through dietary changes, portion control Recommend she continue slow gentle weight loss trajectory Lab work much improved, reflected by lower weight  ADJ DISORDER WITH MIXED ANXIETY & DEPRESSED MOOD separated from her husband, disabled child Financial stressors    Signed, Twanisha Foulk, MD  [facility] [address], [place], [place] [address]

## 2024-06-18 ENCOUNTER — Ambulatory Visit: Attending: Cardiovascular Disease | Admitting: Cardiovascular Disease

## 2024-06-18 ENCOUNTER — Encounter: Payer: Self-pay | Admitting: Cardiovascular Disease

## 2024-06-18 VITALS — BP 100/60 | HR 68 | Ht <= 58 in | Wt 260.5 lb

## 2024-06-18 DIAGNOSIS — I10 Essential (primary) hypertension: Secondary | ICD-10-CM | POA: Diagnosis not present

## 2024-06-18 DIAGNOSIS — R079 Chest pain, unspecified: Secondary | ICD-10-CM | POA: Diagnosis not present

## 2024-06-18 DIAGNOSIS — E785 Hyperlipidemia, unspecified: Secondary | ICD-10-CM

## 2024-06-18 DIAGNOSIS — E782 Mixed hyperlipidemia: Secondary | ICD-10-CM

## 2024-06-18 DIAGNOSIS — R7303 Prediabetes: Secondary | ICD-10-CM

## 2024-06-18 MED ORDER — NEBIVOLOL HCL 10 MG PO TABS
10.0000 mg | ORAL_TABLET | Freq: Every day | ORAL | 3 refills | Status: AC
Start: 2024-06-18 — End: ?

## 2024-06-18 MED ORDER — VERAPAMIL HCL ER 180 MG PO CP24
180.0000 mg | ORAL_CAPSULE | Freq: Every day | ORAL | 3 refills | Status: AC
Start: 2024-06-18 — End: ?

## 2024-06-18 MED ORDER — ATORVASTATIN CALCIUM 10 MG PO TABS
10.0000 mg | ORAL_TABLET | Freq: Every day | ORAL | 3 refills | Status: AC
Start: 2024-06-18 — End: ?

## 2024-06-18 NOTE — Patient Instructions (Addendum)
 Weight is down 22 pounds from you last clinic visit! Keep up the good work!  Medication Instructions:   Please verapamil  ER down to 180 mg daily  If you need a refill on your cardiac medications before your next appointment, please call your pharmacy.   Lab work: No new labs needed  Testing/Procedures: No new testing needed  Follow-Up: At [facility], you and your health needs are our priority.  As part of our continuing mission to provide you with exceptional heart care, we have created designated Provider Care Teams.  These Care Teams include your primary Cardiologist (physician) and Advanced Practice Providers (APPs -  Physician Assistants and Nurse Practitioners) who all work together to provide you with the care you need, when you need it.  You will need a follow up appointment in 12 months  Providers on your designated Care Team:   Lonni Meager, NP Bernardino Bring, PA-C Cadence [place], [place]  COVID-19 Vaccine Information can be found at: [email or web address] For questions related to vaccine distribution or appointments, please email [email or web address] or call [phone].
